# Patient Record
Sex: Male | Born: 1955
Health system: Southern US, Community
[De-identification: ages and names within clinical notes are randomized; demographics above are authoritative.]

## PROBLEM LIST (undated history)

## (undated) DIAGNOSIS — E119 Type 2 diabetes mellitus without complications: Secondary | ICD-10-CM

## (undated) DIAGNOSIS — M1990 Unspecified osteoarthritis, unspecified site: Secondary | ICD-10-CM

## (undated) DIAGNOSIS — R079 Chest pain, unspecified: Secondary | ICD-10-CM

## (undated) DIAGNOSIS — G4730 Sleep apnea, unspecified: Secondary | ICD-10-CM

## (undated) DIAGNOSIS — I10 Essential (primary) hypertension: Secondary | ICD-10-CM

## (undated) DIAGNOSIS — G40909 Epilepsy, unspecified, not intractable, without status epilepticus: Secondary | ICD-10-CM

## (undated) DIAGNOSIS — D649 Anemia, unspecified: Secondary | ICD-10-CM

## (undated) HISTORY — DX: Chest pain, unspecified: R07.9

## (undated) HISTORY — PX: WISDOM TOOTH EXTRACTION: SHX21

## (undated) HISTORY — DX: Type 2 diabetes mellitus without complications: E11.9

## (undated) HISTORY — PX: KNEE ARTHROSCOPY: SHX127

## (undated) HISTORY — DX: Morbid (severe) obesity due to excess calories: E66.01

## (undated) HISTORY — DX: Epilepsy, unspecified, not intractable, without status epilepticus: G40.909

---
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
## 2002-03-10 ENCOUNTER — Encounter: Payer: Self-pay | Admitting: Emergency Medicine

## 2002-03-10 ENCOUNTER — Emergency Department (HOSPITAL_COMMUNITY): Admission: EM | Admit: 2002-03-10 | Discharge: 2002-03-10 | Payer: Self-pay | Admitting: Emergency Medicine

## 2003-04-04 HISTORY — PX: KNEE ARTHROSCOPY: SHX127

## 2003-10-21 ENCOUNTER — Emergency Department (HOSPITAL_COMMUNITY): Admission: EM | Admit: 2003-10-21 | Discharge: 2003-10-21 | Payer: Self-pay | Admitting: Emergency Medicine

## 2013-06-07 ENCOUNTER — Encounter (HOSPITAL_COMMUNITY): Payer: Self-pay | Admitting: Emergency Medicine

## 2013-06-07 ENCOUNTER — Emergency Department (HOSPITAL_COMMUNITY)
Admission: EM | Admit: 2013-06-07 | Discharge: 2013-06-07 | Disposition: A | Discharge status: Home / Self Care | Payer: BC Managed Care – PPO | Source: Home / Self Care | Attending: Family Medicine | Admitting: Family Medicine

## 2013-06-07 DIAGNOSIS — S300XXA Contusion of lower back and pelvis, initial encounter: Secondary | ICD-10-CM

## 2013-06-07 DIAGNOSIS — S2020XA Contusion of thorax, unspecified, initial encounter: Secondary | ICD-10-CM

## 2013-06-07 MED ORDER — HYDROCODONE-ACETAMINOPHEN 5-325 MG PO TABS: 1.0000 | ORAL_TABLET | Freq: Four times a day (QID) | ORAL | Status: DC | PRN

## 2013-06-07 NOTE — ED Notes (Signed)
Patient states fell last Friday on icy concrete Landing on his tailbone Having some pain

## 2013-06-07 NOTE — ED Provider Notes (Signed)
CSN: [id number]     Arrival date & time 06/07/13  1236 History   First MD Initiated Contact with Patient 06/07/13 1438     Chief Complaint  Patient presents with  . Tailbone Pain   (Consider location/radiation/quality/duration/timing/severity/associated sxs/prior Treatment) Patient is a 58 y.o. male presenting with hip pain. The history is provided by the patient.  Hip Pain This is a new problem. The current episode started more than 1 week ago (slipped on ice landing on left buttock, still sore.). The problem has not changed since onset.Pertinent negatives include no chest pain, no abdominal pain and no headaches. The symptoms are aggravated by walking (sitting on commode.).    History reviewed. No pertinent past medical history. History reviewed. No pertinent past surgical history. No family history on file. History  Substance Use Topics  . Smoking status: Not on file  . Smokeless tobacco: Not on file  . Alcohol Use: Not on file    Review of Systems  Constitutional: Negative.   Cardiovascular: Negative for chest pain.  Gastrointestinal: Negative for abdominal pain.  Musculoskeletal: Positive for gait problem and myalgias. Negative for back pain.  Skin: Negative.   Neurological: Negative for headaches.    Allergies  Review of patient's allergies indicates no known allergies.  Home Medications   Current Outpatient Rx  Name  Route  Sig  Dispense  Refill  . HYDROcodone-acetaminophen (NORCO/VICODIN) 5-325 MG per tablet   Oral   Take 1-2 tablets by mouth every 6 (six) hours as needed.   15 tablet   0    BP 155/84  Pulse 81  Temp(Src) 98.6 F (37 C) (Oral)  Resp 18  SpO2 100% Physical Exam  Nursing note and vitals reviewed. Constitutional: He is oriented to person, place, and time. He appears well-developed and well-nourished.  Abdominal: Soft. Bowel sounds are normal.  Musculoskeletal: He exhibits tenderness.  Soreness without bruising to left lat buttock,  ambulatory.  Neurological: He is alert and oriented to person, place, and time.  Skin: Skin is warm and dry.    ED Course  Procedures (including critical care time) Labs Review Labs Reviewed - No data to display Imaging Review No results found.   MDM   1. Contusion of pelvis        Billy Fischer, MD 06/07/13 1447

## 2013-06-07 NOTE — Discharge Instructions (Signed)
Heat and pain medicine as needed, activity as tolerated, return as needed.

## 2015-11-12 LAB — BASIC METABOLIC PANEL
BUN: 12 (ref 4–21)
CO2: 24 — AB (ref 13–22)
Chloride: 100 (ref 99–108)
Creatinine: 1.1 (ref 0.6–1.3)
Glucose: 119
Potassium: 4.4 (ref 3.4–5.3)
Sodium: 139 (ref 137–147)

## 2015-11-12 LAB — HEPATIC FUNCTION PANEL
ALT: 12 (ref 10–40)
AST: 21 (ref 14–40)
Alkaline Phosphatase: 105 (ref 25–125)
Bilirubin, Total: 0.3

## 2015-11-12 LAB — CBC AND DIFFERENTIAL
HCT: 37 — AB (ref 41–53)
Hemoglobin: 11.6 — AB (ref 13.5–17.5)
Platelets: 294 (ref 150–399)
WBC: 8.1

## 2015-11-12 LAB — HEMOGLOBIN A1C: Hemoglobin A1C: 6.9

## 2015-11-12 LAB — TSH: TSH: 3.02 (ref 0.41–5.90)

## 2015-11-12 LAB — COMPREHENSIVE METABOLIC PANEL: Calcium: 9.4 (ref 8.7–10.7)

## 2015-11-12 LAB — CBC: RBC: 4.98 (ref 3.87–5.11)

## 2016-08-21 DIAGNOSIS — M1990 Unspecified osteoarthritis, unspecified site: Secondary | ICD-10-CM | POA: Insufficient documentation

## 2016-08-23 DIAGNOSIS — I10 Essential (primary) hypertension: Secondary | ICD-10-CM | POA: Insufficient documentation

## 2016-09-27 DIAGNOSIS — E785 Hyperlipidemia, unspecified: Secondary | ICD-10-CM | POA: Insufficient documentation

## 2016-09-27 DIAGNOSIS — D649 Anemia, unspecified: Secondary | ICD-10-CM | POA: Insufficient documentation

## 2016-09-27 DIAGNOSIS — R7303 Prediabetes: Secondary | ICD-10-CM | POA: Insufficient documentation

## 2017-06-07 ENCOUNTER — Ambulatory Visit: Payer: Self-pay

## 2017-06-07 ENCOUNTER — Other Ambulatory Visit: Payer: Self-pay | Admitting: Occupational Medicine

## 2017-06-07 DIAGNOSIS — M25532 Pain in left wrist: Secondary | ICD-10-CM

## 2017-06-07 DIAGNOSIS — M25531 Pain in right wrist: Secondary | ICD-10-CM

## 2017-06-07 DIAGNOSIS — G8929 Other chronic pain: Secondary | ICD-10-CM

## 2017-07-09 ENCOUNTER — Encounter: Payer: Self-pay | Admitting: Registered"

## 2017-07-09 ENCOUNTER — Encounter: Payer: BLUE CROSS/BLUE SHIELD | Attending: Family Medicine | Admitting: Registered"

## 2017-07-09 DIAGNOSIS — E119 Type 2 diabetes mellitus without complications: Secondary | ICD-10-CM | POA: Insufficient documentation

## 2017-07-09 DIAGNOSIS — Z713 Dietary counseling and surveillance: Secondary | ICD-10-CM | POA: Diagnosis not present

## 2017-07-09 NOTE — Patient Instructions (Signed)
Ways to help control blood sugar: Exercise: keep up your activity routines. Aim for at least 3 days per week of moderate activity that raises your heart reate for 30 min. Consider working up to 5 days per week. Sleep: Aim to get 7-8 hours of restful sleep. You have a goal of going to bed by 10 pm Food: Rethink your drink. Water is a great choice. You have set a goal of reducing the amount of sweetner you put in your koolaid and coffee. Pay attention to the amount carbs in your meals and try to stay within 75 carbs. Benecol is a butter spread that may help reduce cholesterol. Pistachios, peanuts.  Checking blood sugar: Call your doctor's office to ask for the prescription for the lancets and strips for the OneTouch Verio Flex. Consider checking 1 time per day, first thing before eating. Your target number 80-130 mg/dL You can check 2 hours after a meal if you want to. Your target number is less than 180 mg/dL

## 2017-07-09 NOTE — Progress Notes (Addendum)
Diabetes Self-Management Education  Visit Type: First/Initial  Appt. Start Time: 0805 Appt. End Time: 0915  07/09/2017  [name], identified by name and date of birth, is a 62 y.o. male with a diagnosis of Diabetes: Type 2.   ASSESSMENT Patient states he wants to control his blood sugar without taking medication ("refused to take medicine"). Patient states he would also like to lose weight. Patient is visually impaired but has enough vision he was able to use meter without issue and could see the BG reading when getting close to meter.  Patient states he is getting married in September and very busy with plans. Patient states due to his anemia doctor highly concerned that there is internal bleeding and patient states his doctor was supposed to get a referral to oncology. RD encouraged patient to follow-up with doctor.  Patient states he has a lot of structure to his life. Patient eats dinner at 5:30 at church on [employer], goes out on Fridays and may eat around 8 pm. Pt states he participated in darts, and goes bowling 1-2x week. Pateint states he is training for the [employer] in June. Patient also has structured routine for breakfast.  Patient states he only sleeps 5-6 hrs. Patient states he stays busy at night and gets involved in doing things he enjoys and may not go to sleep until midnight and then wakes up for work at 4:45 pm. Patient states he has energy when getting ready for work, but he gets tired when sits in chair at work.  Patient states he drinks ~8 oz OJ 3x week, quit drinking alcohol 2016.  Meter provided: [employer] Flex Lot #: [id number] X Exp: 06/01/18 BG: 187  Diabetes Self-Management Education - 07/09/17 0809      Visit Information   Visit Type  First/Initial      Initial Visit   Diabetes Type  Type 2    Are you currently following a meal plan?  No    Are you taking your medications as prescribed?  Not on Medications    Date  Diagnosed  March 2019      Health Coping   How would you rate your overall health?  Fair      Psychosocial Assessment   Patient Belief/Attitude about Diabetes  Motivated to manage diabetes    How often do you need to have someone help you when you read instructions, pamphlets, or other written materials from your doctor or pharmacy?  1 - Never    What is the last grade level you completed in school?  high school      Complications   Last HgB A1C per patient/outside source  7 % per referral    How often do you check your blood sugar?  0 times/day (not testing)    Have you had a dilated eye exam in the past 12 months?  No    Have you had a dental exam in the past 12 months?  No    Are you checking your feet?  Yes    How many days per week are you checking your feet?  5      Dietary Intake   Breakfast  3-4x week frosted flakes cereal, banana, coffee OR Sundays egg beaters, [place] sausage, pancakes syrup    Snack (morning)  none    Lunch  whipped yogurt OR sandwich OR tuna on crackers OR spaghetti OR grilled chicken salad  12-12:30 lunch break    Snack (  afternoon)  lorna doone cookies 2:30 break time    Dinner  chilis 1/2 slab ribs, sausage jalapeno, chili (2 meals), strawberry tea (no refill) OR baked chicken, cream corn 7 pm on gym nights    Snack (evening)  weekends ice cream & cake OR cookies OR none 10 pm    Beverage(s)  coffee flavored creamer, 1 T sugar, koolaid,       Exercise   Exercise Type  Light (walking / raking leaves)    How many days per week to you exercise?  2    How many minutes per day do you exercise?  40    Total minutes per week of exercise  80      Patient Education   Previous Diabetes Education  No    Nutrition management   Role of diet in the treatment of diabetes and the relationship between the three main macronutrients and blood glucose level    Physical activity and exercise   Role of exercise on diabetes management, blood pressure control and cardiac  health.    Monitoring  Taught/evaluated SMBG meter.    Acute complications  Taught treatment of hypoglycemia - the 15 rule.    Psychosocial adjustment  Role of stress on diabetes      Individualized Goals (developed by patient)   Physical Activity  Exercise 3-5 times per week    Monitoring   test my blood glucose as discussed      Outcomes   Expected Outcomes  Demonstrated interest in learning. Expect positive outcomes    Future DMSE  PRN    Program Status  Completed      Individualized Plan for Diabetes Self-Management Training:   Learning Objective:  Patient will have a greater understanding of diabetes self-management. Patient education plan is to attend individual and/or group sessions per assessed needs and concerns.   Patient Instructions  Ways to help control blood sugar: Exercise: keep up your activity routines. Aim for at least 3 days per week of moderate activity that raises your heart reate for 30 min. Consider working up to 5 days per week. Sleep: Aim to get 7-8 hours of restful sleep. You have a goal of going to bed by 10 pm Food: Rethink your drink. Water is a great choice. You have set a goal of reducing the amount of sweetner you put in your koolaid and coffee. Pay attention to the amount carbs in your meals and try to stay within 75 carbs. Benecol is a butter spread that may help reduce cholesterol. Pistachios, peanuts.  Checking blood sugar: Call your doctor's office to ask for the prescription for the lancets and strips for the OneTouch Verio Flex. Consider checking 1 time per day, first thing before eating. Your target number 80-130 mg/dL You can check 2 hours after a meal if you want to. Your target number is less than 180 mg/dL   Expected Outcomes:  Demonstrated interest in learning. Expect positive outcomes  Education material provided: A1C conversion sheet  If problems or questions, patient to contact team via:  Phone  Future DSME appointment: PRN

## 2017-08-17 DIAGNOSIS — R600 Localized edema: Secondary | ICD-10-CM | POA: Diagnosis not present

## 2017-08-29 DIAGNOSIS — D649 Anemia, unspecified: Secondary | ICD-10-CM | POA: Diagnosis not present

## 2017-08-29 DIAGNOSIS — E785 Hyperlipidemia, unspecified: Secondary | ICD-10-CM | POA: Diagnosis not present

## 2017-08-29 DIAGNOSIS — E119 Type 2 diabetes mellitus without complications: Secondary | ICD-10-CM | POA: Diagnosis not present

## 2017-08-29 DIAGNOSIS — Z125 Encounter for screening for malignant neoplasm of prostate: Secondary | ICD-10-CM | POA: Diagnosis not present

## 2017-08-29 DIAGNOSIS — E0843 Diabetes mellitus due to underlying condition with diabetic autonomic (poly)neuropathy: Secondary | ICD-10-CM | POA: Insufficient documentation

## 2017-08-29 DIAGNOSIS — I10 Essential (primary) hypertension: Secondary | ICD-10-CM | POA: Diagnosis not present

## 2017-08-29 DIAGNOSIS — Z6841 Body Mass Index (BMI) 40.0 and over, adult: Secondary | ICD-10-CM | POA: Diagnosis not present

## 2017-12-19 DIAGNOSIS — I10 Essential (primary) hypertension: Secondary | ICD-10-CM | POA: Diagnosis not present

## 2017-12-19 DIAGNOSIS — D649 Anemia, unspecified: Secondary | ICD-10-CM | POA: Diagnosis not present

## 2017-12-19 DIAGNOSIS — Z6841 Body Mass Index (BMI) 40.0 and over, adult: Secondary | ICD-10-CM | POA: Diagnosis not present

## 2017-12-19 DIAGNOSIS — E785 Hyperlipidemia, unspecified: Secondary | ICD-10-CM | POA: Diagnosis not present

## 2017-12-19 DIAGNOSIS — E119 Type 2 diabetes mellitus without complications: Secondary | ICD-10-CM | POA: Diagnosis not present

## 2018-01-12 ENCOUNTER — Emergency Department (HOSPITAL_COMMUNITY)
Admission: EM | Admit: 2018-01-12 | Discharge: 2018-01-12 | Disposition: A | Discharge status: Home / Self Care | Payer: BLUE CROSS/BLUE SHIELD | Attending: Emergency Medicine | Admitting: Emergency Medicine

## 2018-01-12 ENCOUNTER — Emergency Department (HOSPITAL_COMMUNITY): Payer: BLUE CROSS/BLUE SHIELD

## 2018-01-12 ENCOUNTER — Encounter (HOSPITAL_COMMUNITY): Payer: Self-pay | Admitting: Emergency Medicine

## 2018-01-12 DIAGNOSIS — L03032 Cellulitis of left toe: Secondary | ICD-10-CM | POA: Diagnosis not present

## 2018-01-12 DIAGNOSIS — Z79899 Other long term (current) drug therapy: Secondary | ICD-10-CM | POA: Diagnosis not present

## 2018-01-12 DIAGNOSIS — I10 Essential (primary) hypertension: Secondary | ICD-10-CM | POA: Insufficient documentation

## 2018-01-12 DIAGNOSIS — M79672 Pain in left foot: Secondary | ICD-10-CM | POA: Diagnosis present

## 2018-01-12 DIAGNOSIS — L089 Local infection of the skin and subcutaneous tissue, unspecified: Secondary | ICD-10-CM | POA: Diagnosis not present

## 2018-01-12 DIAGNOSIS — E119 Type 2 diabetes mellitus without complications: Secondary | ICD-10-CM | POA: Diagnosis not present

## 2018-01-12 HISTORY — DX: Type 2 diabetes mellitus without complications: E11.9

## 2018-01-12 HISTORY — DX: Essential (primary) hypertension: I10

## 2018-01-12 LAB — CBC
HCT: 39.7 % (ref 39.0–52.0)
Hemoglobin: 11.9 g/dL — ABNORMAL LOW (ref 13.0–17.0)
MCH: 23.5 pg — ABNORMAL LOW (ref 26.0–34.0)
MCHC: 30 g/dL (ref 30.0–36.0)
MCV: 78.5 fL — ABNORMAL LOW (ref 80.0–100.0)
Platelets: 256 10*3/uL (ref 150–400)
RBC: 5.06 MIL/uL (ref 4.22–5.81)
RDW: 14.6 % (ref 11.5–15.5)
WBC: 10 10*3/uL (ref 4.0–10.5)
nRBC: 0 % (ref 0.0–0.2)

## 2018-01-12 LAB — BASIC METABOLIC PANEL
Anion gap: 8 (ref 5–15)
BUN: 12 mg/dL (ref 8–23)
CO2: 26 mmol/L (ref 22–32)
Calcium: 9.3 mg/dL (ref 8.9–10.3)
Chloride: 103 mmol/L (ref 98–111)
Creatinine, Ser: 1.01 mg/dL (ref 0.61–1.24)
GFR calc Af Amer: >60 mL/min (ref >60)
GFR calc non Af Amer: >60 mL/min (ref >60)
Glucose, Bld: 105 mg/dL — ABNORMAL HIGH (ref 70–99)
Potassium: 3.7 mmol/L (ref 3.5–5.1)
Sodium: 137 mmol/L (ref 135–145)

## 2018-01-12 MED ORDER — DOXYCYCLINE HYCLATE 100 MG PO CAPS: 100.0000 mg | ORAL_CAPSULE | Freq: Two times a day (BID) | ORAL | 0 refills | Status: DC

## 2018-01-12 MED ORDER — DOXYCYCLINE HYCLATE 100 MG PO TABS
100.0000 mg | ORAL_TABLET | Freq: Once | ORAL | Status: AC
  Administered 2018-01-12: 100 mg via ORAL
  Filled 2018-01-12: qty 1

## 2018-01-12 NOTE — Discharge Instructions (Addendum)
Apply antibacterial ointment twice a day to the toe  Soak your foot in warm water with an antibacterial soap twice a day for 20 minutes.  Pat dry the location, apply an antibacterial ointment  Please call the podiatrist for follow up

## 2018-01-12 NOTE — ED Provider Notes (Signed)
[facility] EMERGENCY DEPARTMENT Provider Note   CSN: [id number] Arrival date & time: 01/12/18  1637     History   Chief Complaint Chief Complaint  Patient presents with  . Foot Pain    HPI Michael [name] is a 62 y.o. male.  HPI Patient is a 62 year old male presents to the emergency department with complaints of increasing pain between his first and second toes on his left foot.  He also reports mild pain to the left second toe with some developing redness.  No known injury.  He does have a history of diabetes.  Denies fevers or chills.  No known injury or trauma.  Patient is legally blind and therefore it is difficult for him to keep an eye on his feet.  No fevers or chills.  No spreading redness besides the area isolated to the left second toe.   Past Medical History:  Diagnosis Date  . Diabetes mellitus without complication ([place])   . Hypertension     There are no active problems to display for this patient.   Past Surgical History:  Procedure Laterality Date  . KNEE ARTHROSCOPY          Home Medications    Prior to Admission medications   Medication Sig Start Date End Date Taking? Authorizing Provider  amLODipine (NORVASC) 10 MG tablet Take 10 mg by mouth daily.    [provider]  atorvastatin (LIPITOR) 20 MG tablet Take 20 mg by mouth daily.    [provider]  doxycycline (VIBRAMYCIN) 100 MG capsule Take 1 capsule (100 mg total) by mouth 2 (two) times daily. 01/12/18   Jola Schmidt, MD  HYDROcodone-acetaminophen (NORCO/VICODIN) 5-325 MG per tablet Take 1-2 tablets by mouth every 6 (six) hours as needed. 06/07/13   Billy Fischer, MD    Family History History reviewed. No pertinent family history.  Social History Social History   Tobacco Use  . Smoking status: Never Smoker  . Smokeless tobacco: Never Used  Substance Use Topics  . Alcohol use: Not Currently  . Drug use: Never     Allergies   Patient has no  known allergies.   Review of Systems Review of Systems  All other systems reviewed and are negative.    Physical Exam Updated Vital Signs BP (!) 145/84   Pulse 72   Temp 98.4 F (36.9 C) (Oral)   Resp 16   SpO2 97%   Physical Exam  Constitutional: He is oriented to person, place, and time. He appears well-developed and well-nourished.  HENT:  Head: Normocephalic.  Eyes: EOM are normal.  Neck: Normal range of motion.  Pulmonary/Chest: Effort normal.  Abdominal: He exhibits no distension.  Musculoskeletal: Normal range of motion.  Normal PT and DP pulse the left foot.  Compartments left foot are soft.  Patient does have skin breakdown between the first and second toes on the left foot with developing erythema of the left second toe spreading to the very distal aspect of the dorsum of the left foot.  This was outlined with a pen  Neurological: He is alert and oriented to person, place, and time.  Psychiatric: He has a normal mood and affect.  Nursing note and vitals reviewed.    ED Treatments / Results  Labs (all labs ordered are listed, but only abnormal results are displayed) Labs Reviewed  CBC - Abnormal; Notable for the following components:      Result Value   Hemoglobin 11.9 (*)  MCV 78.5 (*)    MCH 23.5 (*)    All other components within normal limits  BASIC METABOLIC PANEL - Abnormal; Notable for the following components:   Glucose, Bld 105 (*)    All other components within normal limits    EKG None  Radiology Dg Foot Complete Left  Result Date: 01/12/2018 CLINICAL DATA:  Pain and soft tissue infection between 1st and 2nd toes. EXAM: LEFT FOOT - COMPLETE 3+ VIEW COMPARISON:  None. FINDINGS: There is no evidence of fracture or dislocation. No evidence of osteolysis or periostitis. There is no evidence of arthropathy. Small plantar and dorsal calcaneal bone spurs noted. Soft tissues are unremarkable. IMPRESSION: No acute findings. Electronically Signed    By: Earle Gell M.D.   On: 01/12/2018 20:09    Procedures Procedures (including critical care time)  Medications Ordered in ED Medications  doxycycline (VIBRA-TABS) tablet 100 mg (100 mg Oral Given 01/12/18 1853)     Initial Impression / Assessment and Plan / ED Course  I have reviewed the triage vital signs and the nursing notes.  Pertinent labs & imaging results that were available during my care of the patient were reviewed by me and considered in my medical decision making (see chart for details).     Patient will be initiated on antibiotics.  Recommended warm Dial soap soaks twice a day.  Recommended topical antibiotic twice a day.  We will start the patient on doxycycline.  I have asked the patient to follow-up with the podiatrist this week.  He understands return to the ER for new or worsening symptoms.  Skin breakdown between the first and second toe with developing cellulitis.  X-ray without signs of osteo-.  X-ray without subcutaneous air  All questions answered.  Final Clinical Impressions(s) / ED Diagnoses   Final diagnoses:  Cellulitis of left toe    ED Discharge Orders         Ordered    doxycycline (VIBRAMYCIN) 100 MG capsule  2 times daily     01/12/18 2038           Jola Schmidt, MD 01/12/18 2042

## 2018-01-12 NOTE — ED Triage Notes (Signed)
Pt presents to ED for assessment of left great and second toe pain on the left foot.  Redness, swelling, and dried blood noted.  Pt denies known injury.  Hx of DM 2, states he does not take anything for it.  Patient states sugars recently 120s.

## 2018-01-23 ENCOUNTER — Ambulatory Visit: Payer: BLUE CROSS/BLUE SHIELD | Admitting: Podiatry

## 2018-01-23 DIAGNOSIS — B351 Tinea unguium: Secondary | ICD-10-CM

## 2018-01-23 DIAGNOSIS — I70245 Atherosclerosis of native arteries of left leg with ulceration of other part of foot: Secondary | ICD-10-CM

## 2018-01-23 DIAGNOSIS — L97522 Non-pressure chronic ulcer of other part of left foot with fat layer exposed: Secondary | ICD-10-CM | POA: Diagnosis not present

## 2018-01-23 DIAGNOSIS — M79676 Pain in unspecified toe(s): Secondary | ICD-10-CM

## 2018-01-23 DIAGNOSIS — E0843 Diabetes mellitus due to underlying condition with diabetic autonomic (poly)neuropathy: Secondary | ICD-10-CM

## 2018-01-23 MED ORDER — DOXYCYCLINE HYCLATE 100 MG PO TABS: 100.0000 mg | ORAL_TABLET | Freq: Two times a day (BID) | ORAL | 0 refills | Status: DC

## 2018-01-27 NOTE — Progress Notes (Signed)
   Subjective:  62 year old male presenting today as a new patient with a chief complaint of a wound to the left second toe that he noticed 2-3 weeks ago. He reports associated swelling and redness of the wound. He states he has taken a course of Doxycyline for treatment. He denies any known trauma or injury of the toe. Patient is here for further evaluation and treatment.    Objective/Physical Exam General: The patient is alert and oriented x3 in no acute distress.  Dermatology:  Wound #1 noted to the left 2nd toe measuring 0.3 x 0.3 x 0.1 cm (LxWxD).   To the noted ulceration(s), there is no eschar. There is a moderate amount of slough, fibrin, and necrotic tissue noted. Granulation tissue and wound base is red. There is a minimal amount of serosanguineous drainage noted. There is no exposed bone muscle-tendon ligament or joint. There is no malodor. Periwound integrity is intact. Skin is warm, dry and supple bilateral lower extremities.  Vascular: Palpable pedal pulses bilaterally. No edema or erythema noted. Capillary refill within normal limits.  Neurological: Epicritic and protective threshold diminished bilaterally.   Musculoskeletal Exam: Range of motion within normal limits to all pedal and ankle joints bilateral. Muscle strength 5/5 in all groups bilateral.   Assessment: #1 ulceration of the left 2nd toe secondary to diabetes mellitus #2 diabetes mellitus w/ peripheral neuropathy #3 Onychomycosis of nail due to dermatophyte bilateral   Plan of Care:  #1 Patient was evaluated. #2 medically necessary excisional debridement including subcutaneous tissue was performed using a tissue nipper and a chisel blade. Excisional debridement of all the necrotic nonviable tissue down to healthy bleeding viable tissue was performed with post-debridement measurements same as pre-. #3 the wound was cleansed and dry sterile dressing applied. #4 Mechanical debridement of nails 1-5 bilaterally  performed using a nail nipper. Filed with dremel without incident.  #5 Recommended using Betadine twice daily between the toes.  #6 Refill prescription for Doxycycline 100 mg #20 provided to patient.  #7 patient is to return to clinic in 2 weeks.   Edrick Kins, DPM [facility]  Dr. Edrick Kins, [address]                                        [place], [place] [address]                Office [phone]  Fax [phone]

## 2018-02-06 ENCOUNTER — Encounter: Payer: Self-pay | Admitting: Podiatry

## 2018-02-06 ENCOUNTER — Ambulatory Visit: Payer: BLUE CROSS/BLUE SHIELD | Admitting: Podiatry

## 2018-02-06 DIAGNOSIS — E0843 Diabetes mellitus due to underlying condition with diabetic autonomic (poly)neuropathy: Secondary | ICD-10-CM

## 2018-02-06 DIAGNOSIS — I70245 Atherosclerosis of native arteries of left leg with ulceration of other part of foot: Secondary | ICD-10-CM

## 2018-02-06 DIAGNOSIS — L97522 Non-pressure chronic ulcer of other part of left foot with fat layer exposed: Secondary | ICD-10-CM

## 2018-02-09 LAB — WOUND CULTURE
MICRO NUMBER:: 91336863
SPECIMEN QUALITY:: ADEQUATE

## 2018-02-10 NOTE — Progress Notes (Signed)
   Subjective:  62 year old male with PMHx of DM presenting today for follow up evaluation of an ulceration of the left second toe. He states he does not believe the wound has improved since his last visit. He reports some mild soreness of the area. He has been using Betadine daily. There are no modifying factors noted. Patient is here for further evaluation and treatment.   Past Medical History:  Diagnosis Date  . Diabetes mellitus without complication ([place])   . Hypertension       Objective/Physical Exam General: The patient is alert and oriented x3 in no acute distress.  Dermatology:  Wound #1 noted to the left 2nd toe measuring 0.7 x 0.7 x 0.1 cm (LxWxD).   To the noted ulceration(s), there is no eschar. There is a moderate amount of slough, fibrin, and necrotic tissue noted. Granulation tissue and wound base is red. There is a minimal amount of serosanguineous drainage noted. There is no exposed bone muscle-tendon ligament or joint. There is no malodor. Periwound integrity is intact. Skin is warm, dry and supple bilateral lower extremities.  Vascular: Palpable pedal pulses bilaterally. No edema or erythema noted. Capillary refill within normal limits.  Neurological: Epicritic and protective threshold diminished bilaterally.   Musculoskeletal Exam: Range of motion within normal limits to all pedal and ankle joints bilateral. Muscle strength 5/5 in all groups bilateral.   Assessment: #1 ulceration of the left 2nd toe secondary to diabetes mellitus #2 diabetes mellitus w/ peripheral neuropathy   Plan of Care:  #1 Patient was evaluated. #2 medically necessary excisional debridement including subcutaneous tissue was performed using a tissue nipper and a chisel blade. Excisional debridement of all the necrotic nonviable tissue down to healthy bleeding viable tissue was performed with post-debridement measurements same as pre-. #3 the wound was cleansed and dry sterile dressing  applied. #4 Mechanical debridement of nails 1-5 bilaterally performed using a nail nipper. Filed with dremel without incident.  #5 Discontinue using Betadine.  #6 Begin using Aquacel and a bandage daily.  #7 Cultures taken from wound.  #8 Return to clinic in 2 weeks.    Edrick Kins, DPM [facility]  Dr. Edrick Kins, [address]                                        [place], [place] [address]                Office [phone]  Fax [phone]

## 2018-02-20 ENCOUNTER — Ambulatory Visit: Payer: BLUE CROSS/BLUE SHIELD | Admitting: Podiatry

## 2018-02-20 ENCOUNTER — Encounter: Payer: Self-pay | Admitting: Podiatry

## 2018-02-20 DIAGNOSIS — E0843 Diabetes mellitus due to underlying condition with diabetic autonomic (poly)neuropathy: Secondary | ICD-10-CM | POA: Diagnosis not present

## 2018-02-20 DIAGNOSIS — I70245 Atherosclerosis of native arteries of left leg with ulceration of other part of foot: Secondary | ICD-10-CM | POA: Diagnosis not present

## 2018-02-20 DIAGNOSIS — L97522 Non-pressure chronic ulcer of other part of left foot with fat layer exposed: Secondary | ICD-10-CM

## 2018-02-25 NOTE — Progress Notes (Signed)
   Subjective:  62 year old male with PMHx of DM presenting today for follow up evaluation of an ulceration of the left second toe. He states the wound has improved since his previous visit. He reports some mild erythema. There are no modifying factors noted. He has been using Aquacel Ag as directed. Patient is here for further evaluation and treatment.   Past Medical History:  Diagnosis Date  . Diabetes mellitus without complication ([place])   . Hypertension       Objective/Physical Exam General: The patient is alert and oriented x3 in no acute distress.  Dermatology:  Wound #1 noted to the left 2nd toe measuring 0.3 x 0.3 x 0.1 cm (LxWxD).   To the noted ulceration(s), there is no eschar. There is a moderate amount of slough, fibrin, and necrotic tissue noted. Granulation tissue and wound base is red. There is a minimal amount of serosanguineous drainage noted. There is no exposed bone muscle-tendon ligament or joint. There is no malodor. Periwound integrity is intact. Skin is warm, dry and supple bilateral lower extremities.  Vascular: Palpable pedal pulses bilaterally. No edema or erythema noted. Capillary refill within normal limits.  Neurological: Epicritic and protective threshold diminished bilaterally.   Musculoskeletal Exam: Range of motion within normal limits to all pedal and ankle joints bilateral. Muscle strength 5/5 in all groups bilateral.   Assessment: #1 ulceration of the left 2nd toe secondary to diabetes mellitus #2 diabetes mellitus w/ peripheral neuropathy   Plan of Care:  #1 Patient was evaluated. #2 medically necessary excisional debridement including subcutaneous tissue was performed using a tissue nipper and a chisel blade. Excisional debridement of all the necrotic nonviable tissue down to healthy bleeding viable tissue was performed with post-debridement measurements same as pre-. #3 the wound was cleansed and dry sterile dressing applied. #4 Continue using  Aquacel Ag daily with a bandage.  #5 Return to clinic in 3 weeks. We will discuss onychomycosis treatments at the next visit.    Edrick Kins, DPM [facility]  Dr. Edrick Kins, [address]                                        [place], [place] [address]                Office [phone]  Fax [phone]

## 2018-03-18 ENCOUNTER — Ambulatory Visit: Payer: BLUE CROSS/BLUE SHIELD | Admitting: Podiatry

## 2018-03-18 ENCOUNTER — Encounter: Payer: Self-pay | Admitting: Podiatry

## 2018-03-18 DIAGNOSIS — B351 Tinea unguium: Secondary | ICD-10-CM | POA: Diagnosis not present

## 2018-03-18 DIAGNOSIS — I70245 Atherosclerosis of native arteries of left leg with ulceration of other part of foot: Secondary | ICD-10-CM | POA: Diagnosis not present

## 2018-03-18 DIAGNOSIS — E0843 Diabetes mellitus due to underlying condition with diabetic autonomic (poly)neuropathy: Secondary | ICD-10-CM

## 2018-03-18 DIAGNOSIS — L97522 Non-pressure chronic ulcer of other part of left foot with fat layer exposed: Secondary | ICD-10-CM

## 2018-03-18 DIAGNOSIS — M79676 Pain in unspecified toe(s): Secondary | ICD-10-CM

## 2018-03-20 DIAGNOSIS — I10 Essential (primary) hypertension: Secondary | ICD-10-CM | POA: Diagnosis not present

## 2018-03-20 DIAGNOSIS — E785 Hyperlipidemia, unspecified: Secondary | ICD-10-CM | POA: Diagnosis not present

## 2018-03-20 DIAGNOSIS — D649 Anemia, unspecified: Secondary | ICD-10-CM | POA: Diagnosis not present

## 2018-03-20 DIAGNOSIS — E119 Type 2 diabetes mellitus without complications: Secondary | ICD-10-CM | POA: Diagnosis not present

## 2018-03-21 NOTE — Progress Notes (Signed)
   Subjective: 62 year old male presenting today for follow up evaluation of a diabetic ulceration of the left second toe. He states the wound has healed and he has no concerns regarding it. He denies any pain or modifying factors. Patient is here for further evaluation and treatment.   Past Medical History:  Diagnosis Date  . Diabetes mellitus without complication ([place])   . Hypertension     Objective: Physical Exam General: The patient is alert and oriented x3 in no acute distress.  Dermatology: Wound noted to the left second toe has healed. Complete re-epithelialization has occurred. No drainage noted. Hyperkeratotic, discolored, thickened, onychodystrophy of nails noted bilaterally. Skin is warm, dry and supple bilateral lower extremities. Negative for open lesions or macerations.  Vascular: Palpable pedal pulses bilaterally. No edema or erythema noted. Capillary refill within normal limits.  Neurological: Epicritic and protective threshold grossly intact bilaterally.   Musculoskeletal Exam: Range of motion within normal limits to all pedal and ankle joints bilateral. Muscle strength 5/5 in all groups bilateral.   Assessment: #1 ulceration of the left 2nd toe secondary to DM - healed #2 diabetes mellitus with peripheral neuropathy #3 Onychomycosis nails 1-5 bilateral  Plan of Care:  #1 Patient was evaluated. #2 Discussed different treatment option for onychomycosis. Patient will discuss with his wife. He is contemplating laser treatment.  #3 Return to clinic as needed.    Edrick Kins, DPM [facility]  Dr. Edrick Kins, [address]                                        [place], [place] [address]                Office [phone]  Fax [phone]

## 2018-05-01 ENCOUNTER — Telehealth: Payer: Self-pay | Admitting: Hematology

## 2018-05-01 ENCOUNTER — Encounter: Payer: Self-pay | Admitting: Hematology

## 2018-05-01 NOTE — Telephone Encounter (Signed)
A new hem appt has been scheduled for the pt to see Dr. Irene Limbo on 2/18 at 1pm. Pt aware to arrive 30 minutes early. Letter mailed.

## 2018-05-20 NOTE — Progress Notes (Signed)
HEMATOLOGY/ONCOLOGY CONSULTATION NOTE  Date of Service: 05/21/2018  Patient Care Team: [name], NP as PCP - General (Family Medicine)  CHIEF COMPLAINTS/PURPOSE OF CONSULTATION:  Anemia  HISTORY OF PRESENTING ILLNESS:   Michael [name] is a wonderful 63 y.o. male who has been referred to [place] by [name], FNP for evaluation and management of Anemia. The pt reports that he is doing well overall.  The pt reports that he has not felt differently recently as compared to the previous 6 months or a year. He denies any new fatigue, fevers, chills, or night sweats. The pt notes that he has never been aware of having anemia until December. He notes that he is eating less intentionally. He had a colonoscopy 10 years ago, is having his next colonoscopy in March 2020. He denies any bleeding issues or ulcer issues. He has not taken blood thinners before. The pt notes that he has arthritis, localized to his knees and shoulders. He notes that his joints are always stiff, and improves after walking around, denies concerns for RA.   The pt notes that he has had a regular chest pain since 1985 which "comes on by itself." He notes that he has been fully evaluated by cardiology and notes that there have not been any findings to note.  The pt notes that he is not taking any medications for his DM, and notes that this has bene well controlled. He notes that his HTN is well controlled. He denies kidney problems. Endorses myopic degeneration since he was 63 years old, and notes he is legally blind.   He has had two knee surgeries, his last in 2005.  Most recent lab results (03/20/18) of CBC w/diff is as follows: all values are WNL except for HGB at 11.6, HCT at 37.6, MCV at 76.0, MCH at 23.4, MCHC at 30.9, MPV at 13.2.  On review of systems, pt reports stable weight, eating well, good energy levels, and denies concerns for bleeding, swollen joints, leg swelling, abdominal pains, and any other symptoms.    On PMHx the pt reports myopic degeneration, DM type II, HTN.   MEDICAL HISTORY:  Past Medical History:  Diagnosis Date  . Diabetes mellitus without complication ([place])   . Hypertension     SURGICAL HISTORY: Past Surgical History:  Procedure Laterality Date  . KNEE ARTHROSCOPY      SOCIAL HISTORY: Social History   Socioeconomic History  . Marital status: Married    Spouse name: Not on file  . Number of children: Not on file  . Years of education: Not on file  . Highest education level: Not on file  Occupational History  . Not on file  Social Needs  . Financial resource strain: Not on file  . Food insecurity:    Worry: Not on file    Inability: Not on file  . Transportation needs:    Medical: Not on file    Non-medical: Not on file  Tobacco Use  . Smoking status: Never Smoker  . Smokeless tobacco: Never Used  Substance and Sexual Activity  . Alcohol use: Not Currently  . Drug use: Never  . Sexual activity: Not on file  Lifestyle  . Physical activity:    Days per week: Not on file    Minutes per session: Not on file  . Stress: Not on file  Relationships  . Social connections:    Talks on phone: Not on file    Gets together: Not on file  Attends religious service: Not on file    Active member of club or organization: Not on file    Attends meetings of clubs or organizations: Not on file    Relationship status: Not on file  . Intimate partner violence:    Fear of current or ex partner: Not on file    Emotionally abused: Not on file    Physically abused: Not on file    Forced sexual activity: Not on file  Other Topics Concern  . Not on file  Social History Narrative  . Not on file    FAMILY HISTORY: No family history on file.  ALLERGIES:  has No Known Allergies.  MEDICATIONS:  Current Outpatient Medications  Medication Sig Dispense Refill  . amLODipine (NORVASC) 10 MG tablet Take 10 mg by mouth daily.    Marland Kitchen atorvastatin (LIPITOR) 20 MG tablet  Take 20 mg by mouth daily.    Marland Kitchen doxycycline (VIBRA-TABS) 100 MG tablet Take 1 tablet (100 mg total) by mouth 2 (two) times daily. 20 tablet 0  . hydrochlorothiazide (HYDRODIURIL) 12.5 MG tablet hydrochlorothiazide 12.5 mg tablet    . HYDROcodone-acetaminophen (NORCO/VICODIN) 5-325 MG per tablet Take 1-2 tablets by mouth every 6 (six) hours as needed. 15 tablet 0   No current facility-administered medications for this visit.     REVIEW OF SYSTEMS:    10 Point review of Systems was done is negative except as noted above.  PHYSICAL EXAMINATION:  . Vitals:   05/21/18 1244  BP: (!) 159/85  Pulse: 67  Resp: 18  Temp: 98.3 F (36.8 C)  SpO2: 100%   Filed Weights   05/21/18 1244  Weight: (!) 383 lb 9.6 oz (174 kg)   .Body mass index is 47.63 kg/m.  GENERAL:alert, in no acute distress and comfortable SKIN: no acute rashes, no significant lesions EYES: conjunctiva are pink and non-injected, sclera anicteric OROPHARYNX: MMM, no exudates, no oropharyngeal erythema or ulceration NECK: supple, no JVD LYMPH:  no palpable lymphadenopathy in the cervical, axillary or inguinal regions LUNGS: clear to auscultation b/l with normal respiratory effort HEART: regular rate & rhythm ABDOMEN:  normoactive bowel sounds , non tender, not distended. Extremity: no pedal edema PSYCH: alert & oriented x 3 with fluent speech NEURO: no focal motor/sensory deficits  LABORATORY DATA:  I have reviewed the data as listed  . CBC Latest Ref Rng & Units 01/12/2018  WBC 4.0 - 10.5 K/uL 10.0  Hemoglobin 13.0 - 17.0 g/dL 11.9(L)  Hematocrit 39.0 - 52.0 % 39.7  Platelets 150 - 400 K/uL 256    . CMP Latest Ref Rng & Units 01/12/2018  Glucose 70 - 99 mg/dL 105(H)  BUN 8 - 23 mg/dL 12  Creatinine 0.61 - 1.24 mg/dL 1.01  Sodium 135 - 145 mmol/L 137  Potassium 3.5 - 5.1 mmol/L 3.7  Chloride 98 - 111 mmol/L 103  CO2 22 - 32 mmol/L 26  Calcium 8.9 - 10.3 mg/dL 9.3   03/20/18 CBC  w/diff:    RADIOGRAPHIC STUDIES: I have personally reviewed the radiological images as listed and agreed with the findings in the report. No results found.  ASSESSMENT & PLAN:  63 y.o. male with  1. Microcytic Anemia  -Discussed patient's most recent labs from 03/20/18, WBC normal at 8.5k, RBC normal at 4.98, HGB at 11.6 with an MCV at 76.0, PLT normal at 241k -11/12/15 HGB at 11.6 with MCV at 74.2 -Iron deficiency vs chronic inflammation vs mild alpha thalassemia trait -Will order blood tests today  for further elucidation as noted below -Follow up with colonoscopy in March with GI -Will see the pt back as needed based on labs today  . Orders Placed This Encounter  Procedures  . CBC with Differential/Platelet    Standing Status:   Future    Number of Occurrences:   1    Standing Expiration Date:   06/25/2019  . CMP ([place] only)    Standing Status:   Future    Number of Occurrences:   1    Standing Expiration Date:   05/22/2019  . Ferritin    Standing Status:   Future    Number of Occurrences:   1    Standing Expiration Date:   05/22/2019  . Iron and TIBC    Standing Status:   Future    Number of Occurrences:   1    Standing Expiration Date:   05/21/2019  . Alpha-Thalassemia GenotypR    Standing Status:   Future    Number of Occurrences:   1    Standing Expiration Date:   05/22/2019  . Hemoglobinopathy evaluation    Standing Status:   Future    Number of Occurrences:   1    Standing Expiration Date:   05/21/2019    Labs today RTC with Dr Irene Limbo based on labs   All of the patients questions were answered with apparent satisfaction. The patient knows to call the clinic with any problems, questions or concerns.  The total time spent in the appt was 30 minutes and more than 50% was on counseling and direct patient cares.    Sullivan Lone MD MS AAHIVMS [facility] Hematology/Oncology Physician [facility]  (Office):       [phone] (Work cell):   [phone] (Fax):           [phone]  05/21/2018 1:30 PM  I, Baldwin [place], am acting as a scribe for Dr. Sullivan Lone.   .I have reviewed the above documentation for accuracy and completeness, and I agree with the above. Brunetta Genera MD

## 2018-05-21 ENCOUNTER — Telehealth: Payer: Self-pay | Admitting: Hematology

## 2018-05-21 ENCOUNTER — Inpatient Hospital Stay: Payer: BLUE CROSS/BLUE SHIELD | Attending: Hematology | Admitting: Hematology

## 2018-05-21 ENCOUNTER — Inpatient Hospital Stay: Payer: BLUE CROSS/BLUE SHIELD

## 2018-05-21 VITALS — BP 159/85 | HR 67 | Temp 98.3°F | Resp 18 | Ht 75.25 in | Wt 383.6 lb

## 2018-05-21 DIAGNOSIS — R718 Other abnormality of red blood cells: Secondary | ICD-10-CM

## 2018-05-21 DIAGNOSIS — D649 Anemia, unspecified: Secondary | ICD-10-CM

## 2018-05-21 DIAGNOSIS — E119 Type 2 diabetes mellitus without complications: Secondary | ICD-10-CM | POA: Diagnosis not present

## 2018-05-21 DIAGNOSIS — D509 Iron deficiency anemia, unspecified: Secondary | ICD-10-CM | POA: Insufficient documentation

## 2018-05-21 DIAGNOSIS — I10 Essential (primary) hypertension: Secondary | ICD-10-CM

## 2018-05-21 LAB — FERRITIN: Ferritin: 132 ng/mL (ref 24–336)

## 2018-05-21 LAB — CMP (CANCER CENTER ONLY)
ALT: 12 U/L (ref 0–44)
AST: 22 U/L (ref 15–41)
Albumin: 4 g/dL (ref 3.5–5.0)
Alkaline Phosphatase: 112 U/L (ref 38–126)
Anion gap: 8 (ref 5–15)
BUN: 12 mg/dL (ref 8–23)
CO2: 29 mmol/L (ref 22–32)
Calcium: 9.3 mg/dL (ref 8.9–10.3)
Chloride: 103 mmol/L (ref 98–111)
Creatinine: 1 mg/dL (ref 0.61–1.24)
GFR, Est AFR Am: >60 mL/min (ref >60)
GFR, Estimated: >60 mL/min (ref >60)
Glucose, Bld: 98 mg/dL (ref 70–99)
Potassium: 3.7 mmol/L (ref 3.5–5.1)
Sodium: 140 mmol/L (ref 135–145)
Total Bilirubin: 0.4 mg/dL (ref 0.3–1.2)
Total Protein: 8.1 g/dL (ref 6.5–8.1)

## 2018-05-21 LAB — CBC WITH DIFFERENTIAL/PLATELET
Abs Immature Granulocytes: 0.01 10*3/uL (ref 0.00–0.07)
Basophils Absolute: 0 10*3/uL (ref 0.0–0.1)
Basophils Relative: 0 %
Eosinophils Absolute: 0.3 10*3/uL (ref 0.0–0.5)
Eosinophils Relative: 4 %
HCT: 38 % — ABNORMAL LOW (ref 39.0–52.0)
Hemoglobin: 11.6 g/dL — ABNORMAL LOW (ref 13.0–17.0)
Immature Granulocytes: 0 %
Lymphocytes Relative: 57 %
Lymphs Abs: 5.1 10*3/uL — ABNORMAL HIGH (ref 0.7–4.0)
MCH: 23.6 pg — ABNORMAL LOW (ref 26.0–34.0)
MCHC: 30.5 g/dL (ref 30.0–36.0)
MCV: 77.2 fL — ABNORMAL LOW (ref 80.0–100.0)
Monocytes Absolute: 0.6 10*3/uL (ref 0.1–1.0)
Monocytes Relative: 6 %
Neutro Abs: 3 10*3/uL (ref 1.7–7.7)
Neutrophils Relative %: 33 %
Platelets: 228 10*3/uL (ref 150–400)
RBC: 4.92 MIL/uL (ref 4.22–5.81)
RDW: 14.7 % (ref 11.5–15.5)
WBC: 9 10*3/uL (ref 4.0–10.5)
nRBC: 0 % (ref 0.0–0.2)

## 2018-05-21 LAB — IRON AND TIBC
Iron: 52 ug/dL (ref 42–163)
Saturation Ratios: 21 % (ref 20–55)
TIBC: 243 ug/dL (ref 202–409)
UIBC: 191 ug/dL (ref 117–376)

## 2018-05-21 NOTE — Telephone Encounter (Signed)
Gave avs  ° °

## 2018-05-23 LAB — HEMOGLOBINOPATHY EVALUATION
Hgb A2 Quant: 1.9 % (ref 1.8–3.2)
Hgb A: 97.5 % (ref 96.4–98.8)
Hgb C: 0 %
Hgb F Quant: 0.6 % (ref 0.0–2.0)
Hgb S Quant: 0 %
Hgb Variant: 0 %

## 2018-05-29 ENCOUNTER — Other Ambulatory Visit: Payer: Self-pay | Admitting: Gastroenterology

## 2018-05-29 DIAGNOSIS — Z1211 Encounter for screening for malignant neoplasm of colon: Secondary | ICD-10-CM | POA: Diagnosis not present

## 2018-05-30 LAB — ALPHA-THALASSEMIA GENOTYPR

## 2018-07-05 ENCOUNTER — Ambulatory Visit (HOSPITAL_COMMUNITY)
Admission: RE | Admit: 2018-07-05 | Payer: BLUE CROSS/BLUE SHIELD | Source: Ambulatory Visit | Admitting: Gastroenterology

## 2018-07-05 ENCOUNTER — Encounter (HOSPITAL_COMMUNITY): Admission: RE | Payer: Self-pay | Source: Ambulatory Visit

## 2018-07-05 SURGERY — COLONOSCOPY WITH PROPOFOL
Anesthesia: Monitor Anesthesia Care

## 2018-10-11 ENCOUNTER — Other Ambulatory Visit: Payer: Self-pay | Admitting: Gastroenterology

## 2018-10-29 ENCOUNTER — Other Ambulatory Visit (HOSPITAL_COMMUNITY)
Admission: RE | Admit: 2018-10-29 | Discharge: 2018-10-29 | Disposition: A | Discharge status: Home / Self Care | Payer: BC Managed Care – PPO | Source: Ambulatory Visit | Attending: Gastroenterology | Admitting: Gastroenterology

## 2018-10-29 DIAGNOSIS — Z20828 Contact with and (suspected) exposure to other viral communicable diseases: Secondary | ICD-10-CM | POA: Diagnosis not present

## 2018-10-29 LAB — SARS CORONAVIRUS 2 (TAT 6-24 HRS): SARS Coronavirus 2: NEGATIVE

## 2018-10-31 NOTE — Progress Notes (Signed)
Pt states he has been at home quarantined since his covid test and has not been around any people not living in his home, pt understands to be at hospital at 630am for procedure

## 2018-11-01 ENCOUNTER — Other Ambulatory Visit: Payer: Self-pay

## 2018-11-01 ENCOUNTER — Ambulatory Visit (HOSPITAL_COMMUNITY): Payer: BC Managed Care – PPO | Admitting: Anesthesiology

## 2018-11-01 ENCOUNTER — Encounter (HOSPITAL_COMMUNITY)
Admission: RE | Disposition: A | Discharge status: Home / Self Care | Payer: Self-pay | Source: Ambulatory Visit | Attending: Gastroenterology

## 2018-11-01 ENCOUNTER — Encounter (HOSPITAL_COMMUNITY): Payer: Self-pay

## 2018-11-01 ENCOUNTER — Ambulatory Visit (HOSPITAL_COMMUNITY)
Admission: RE | Admit: 2018-11-01 | Discharge: 2018-11-01 | Disposition: A | Discharge status: Home / Self Care | Payer: BC Managed Care – PPO | Source: Ambulatory Visit | Attending: Gastroenterology | Admitting: Gastroenterology

## 2018-11-01 DIAGNOSIS — K635 Polyp of colon: Secondary | ICD-10-CM | POA: Insufficient documentation

## 2018-11-01 DIAGNOSIS — D123 Benign neoplasm of transverse colon: Secondary | ICD-10-CM | POA: Insufficient documentation

## 2018-11-01 DIAGNOSIS — Z1211 Encounter for screening for malignant neoplasm of colon: Secondary | ICD-10-CM | POA: Insufficient documentation

## 2018-11-01 DIAGNOSIS — D125 Benign neoplasm of sigmoid colon: Secondary | ICD-10-CM | POA: Diagnosis not present

## 2018-11-01 DIAGNOSIS — E119 Type 2 diabetes mellitus without complications: Secondary | ICD-10-CM | POA: Diagnosis not present

## 2018-11-01 DIAGNOSIS — Z79899 Other long term (current) drug therapy: Secondary | ICD-10-CM | POA: Diagnosis not present

## 2018-11-01 DIAGNOSIS — I10 Essential (primary) hypertension: Secondary | ICD-10-CM | POA: Insufficient documentation

## 2018-11-01 DIAGNOSIS — D120 Benign neoplasm of cecum: Secondary | ICD-10-CM | POA: Diagnosis not present

## 2018-11-01 HISTORY — PX: COLONOSCOPY WITH PROPOFOL: SHX5780

## 2018-11-01 HISTORY — PX: POLYPECTOMY: SHX5525

## 2018-11-01 LAB — GLUCOSE, CAPILLARY
Glucose-Capillary: 120 mg/dL — ABNORMAL HIGH (ref 70–99)
Glucose-Capillary: 102 mg/dL — ABNORMAL HIGH (ref 70–99)

## 2018-11-01 SURGERY — COLONOSCOPY WITH PROPOFOL
Anesthesia: Monitor Anesthesia Care

## 2018-11-01 MED ORDER — LACTATED RINGERS IV SOLN
INTRAVENOUS | Status: DC
  Administered 2018-11-01: 1000 mL via INTRAVENOUS

## 2018-11-01 MED ORDER — LIDOCAINE HCL (CARDIAC) PF 100 MG/5ML IV SOSY
PREFILLED_SYRINGE | INTRAVENOUS | Status: DC | PRN
  Administered 2018-11-01: 100 mg via INTRAVENOUS

## 2018-11-01 MED ORDER — PHENYLEPHRINE 40 MCG/ML (10ML) SYRINGE FOR IV PUSH (FOR BLOOD PRESSURE SUPPORT)
PREFILLED_SYRINGE | INTRAVENOUS | Status: DC | PRN
  Administered 2018-11-01: 80 ug via INTRAVENOUS

## 2018-11-01 MED ORDER — PROPOFOL 10 MG/ML IV BOLUS
INTRAVENOUS | Status: AC
  Filled 2018-11-01: qty 80

## 2018-11-01 MED ORDER — PROPOFOL 500 MG/50ML IV EMUL
INTRAVENOUS | Status: DC | PRN
  Administered 2018-11-01: 125 ug/kg/min via INTRAVENOUS

## 2018-11-01 MED ORDER — PROPOFOL 10 MG/ML IV BOLUS
INTRAVENOUS | Status: DC | PRN
  Administered 2018-11-01: 20 mg via INTRAVENOUS
  Administered 2018-11-01: 30 mg via INTRAVENOUS
  Administered 2018-11-01 (×2): 20 mg via INTRAVENOUS

## 2018-11-01 MED ORDER — PROPOFOL 10 MG/ML IV BOLUS
INTRAVENOUS | Status: AC
  Filled 2018-11-01: qty 20

## 2018-11-01 MED ORDER — SODIUM CHLORIDE 0.9 % IV SOLN: INTRAVENOUS | Status: DC

## 2018-11-01 MED ORDER — ONDANSETRON HCL 4 MG/2ML IJ SOLN
INTRAMUSCULAR | Status: DC | PRN
  Administered 2018-11-01: 4 mg via INTRAVENOUS

## 2018-11-01 SURGICAL SUPPLY — 22 items

## 2018-11-01 NOTE — Anesthesia Preprocedure Evaluation (Signed)
Anesthesia Evaluation  Patient identified by MRN, date of birth, ID band Patient awake    Reviewed: Allergy & Precautions, NPO status , Patient's Chart, lab work & pertinent test results  Airway Mallampati: II  TM Distance: >3 FB Neck ROM: Full    Dental  (+) Edentulous Upper, Edentulous Lower   Pulmonary    breath sounds clear to auscultation       Cardiovascular hypertension,  Rhythm:Regular Rate:Normal     Neuro/Psych    GI/Hepatic   Endo/Other  diabetes  Renal/GU      Musculoskeletal   Abdominal   Peds  Hematology   Anesthesia Other Findings   Reproductive/Obstetrics                             Anesthesia Physical Anesthesia Plan  ASA: III  Anesthesia Plan: MAC   Post-op Pain Management:    Induction: Intravenous  PONV Risk Score and Plan: Ondansetron  Airway Management Planned: Simple Face Mask and Natural Airway  Additional Equipment:   Intra-op Plan:   Post-operative Plan: Extubation in OR  Informed Consent: I have reviewed the patients History and Physical, chart, labs and discussed the procedure including the risks, benefits and alternatives for the proposed anesthesia with the patient or authorized representative who has indicated his/her understanding and acceptance.       Plan Discussed with: CRNA and Anesthesiologist  Anesthesia Plan Comments:         Anesthesia Quick Evaluation

## 2018-11-01 NOTE — Op Note (Signed)
[facility] Patient Name: Michael [name] Procedure Date: 11/01/2018 MRN: [id number] Attending MD: Carol Ada , MD Date of Birth: [birth year] CSN: [id number] Age: 63 Admit Type: Outpatient Procedure:                Colonoscopy Indications:              Screening for colorectal malignant neoplasm Providers:                Carol Ada, MD, Jeanella Cara, RN, Cherylynn Ridges, Technician, Hedy Camara Referring MD:              Medicines:                Propofol per Anesthesia Complications:            No immediate complications. Estimated Blood Loss:     Estimated blood loss: none. Procedure:                Pre-Anesthesia Assessment:                           - Prior to the procedure, a History and Physical                            was performed, and patient medications and                            allergies were reviewed. The patient's tolerance of                            previous anesthesia was also reviewed. The risks                            and benefits of the procedure and the sedation                            options and risks were discussed with the patient.                            All questions were answered, and informed consent                            was obtained. Prior Anticoagulants: The patient has                            taken no previous anticoagulant or antiplatelet                            agents. ASA Grade Assessment: III - A patient with                            severe systemic disease. After reviewing the risks  and benefits, the patient was deemed in                            satisfactory condition to undergo the procedure.                           - Sedation was administered by an anesthesia                            professional. Deep sedation was attained.                           After obtaining informed consent, the colonoscope   was passed under direct vision. Throughout the                            procedure, the patient's blood pressure, pulse, and                            oxygen saturations were monitored continuously. The                            CF-HQ190L ([id number]) Olympus colonoscope was                            introduced through the anus and advanced to the the                            cecum, identified by appendiceal orifice and                            ileocecal valve. The colonoscopy was performed                            without difficulty. The patient tolerated the                            procedure well. The quality of the bowel                            preparation was excellent. The ileocecal valve,                            appendiceal orifice, and rectum were photographed. Scope In: 7:38:24 AM Scope Out: 7:58:26 AM Scope Withdrawal Time: 0 hours 17 minutes 7 seconds  Total Procedure Duration: 0 hours 20 minutes 2 seconds  Findings:      Five sessile polyps were found in the sigmoid colon, transverse colon       and cecum. The polyps were 1 to 3 mm in size. These polyps were removed       with a cold snare. Resection and retrieval were complete. Impression:               - Five 1 to 3 mm polyps in the sigmoid colon, in  the transverse colon and in the cecum, removed with                            a cold snare. Resected and retrieved. Moderate Sedation:      Not Applicable - Patient had care per Anesthesia. Recommendation:           - Patient has a contact number available for                            emergencies. The signs and symptoms of potential                            delayed complications were discussed with the                            patient. Return to normal activities tomorrow.                            Written discharge instructions were provided to the                            patient.                           - Resume previous  diet.                           - Continue present medications.                           - Await pathology results.                           - Repeat colonoscopy in 3 years for surveillance. Procedure Code(s):        --- Professional ---                           [phone], Colonoscopy, flexible; with removal of                            tumor(s), polyp(s), or other lesion(s) by snare                            technique Diagnosis Code(s):        --- Professional ---                           Z12.11, Encounter for screening for malignant                            neoplasm of colon                           K63.5, Polyp of colon CPT copyright 2019 American Medical Association. All rights reserved. The codes documented in this report are preliminary and upon coder review may  be revised to meet  current compliance requirements. Carol Ada, MD Carol Ada, MD 11/01/2018 8:07:08 AM This report has been signed electronically. Number of Addenda: 0

## 2018-11-01 NOTE — H&P (Signed)
  Michael [name] HPI: At this time the patient denies any problems with nausea, vomiting, fevers, chills, abdominal pain, diarrhea, constipation, hematochezia, melena, GERD, or dysphagia. The patient denies any known family history of colon cancers. No complaints of chest pain, SOB, MI, or sleep apnea. He had a normal colonoscopy on 11/28/2007.  Past Medical History:  Diagnosis Date  . Diabetes mellitus without complication ([place])   . Hypertension     Past Surgical History:  Procedure Laterality Date  . KNEE ARTHROSCOPY      History reviewed. No pertinent family history.  Social History:  reports that he has never smoked. He has never used smokeless tobacco. He reports previous alcohol use. He reports that he does not use drugs.  Allergies: No Known Allergies  Medications:  Scheduled:  Continuous: . sodium chloride    . lactated ringers 1,000 mL (11/01/18 0701)    Results for orders placed or performed during the hospital encounter of 11/01/18 (from the past 24 hour(s))  Glucose, capillary     Status: Abnormal   Collection Time: 11/01/18  7:11 AM  Result Value Ref Range   Glucose-Capillary 120 (H) 70 - 99 mg/dL     No results found.  ROS:  As stated above in the HPI otherwise negative.  Blood pressure (!) 194/89, pulse 97, temperature 97.7 F (36.5 C), temperature source Oral, resp. rate 18, height 6' 3.25" (1.911 m), weight (!) 174.6 kg, SpO2 98 %.    PE: Gen: NAD, Alert and Oriented HEENT:  [place]/AT, EOMI Neck: Supple, no LAD Lungs: CTA Bilaterally CV: RRR without M/G/R ABM: Soft, NTND, +BS Ext: No C/C/E  Assessment/Plan: 1) Screening colonoscopy.  Yago Ludvigsen D 11/01/2018, 7:23 AM

## 2018-11-01 NOTE — Transfer of Care (Signed)
Immediate Anesthesia Transfer of Care Note  Patient: Michael [name]  Procedure(s) Performed: COLONOSCOPY WITH PROPOFOL (N/A ) POLYPECTOMY  Patient Location: PACU  Anesthesia Type:MAC  Level of Consciousness: awake, alert , oriented and patient cooperative  Airway & Oxygen Therapy: Patient Spontanous Breathing and Patient connected to nasal cannula oxygen  Post-op Assessment: Report given to RN and Post -op Vital signs reviewed and stable  Post vital signs: Reviewed and stable  Last Vitals:  Vitals Value Taken Time  BP 154/79 11/01/18 0830  Temp 36.5 C 11/01/18 0810  Pulse 78 11/01/18 0830  Resp 22 11/01/18 0830  SpO2 98 % 11/01/18 0830    Last Pain:  Vitals:   11/01/18 0830  TempSrc:   PainSc: 0-No pain         Complications: No apparent anesthesia complications

## 2018-11-01 NOTE — Anesthesia Postprocedure Evaluation (Signed)
Anesthesia Post Note  Patient: Michael [name]  Procedure(s) Performed: COLONOSCOPY WITH PROPOFOL (N/A ) POLYPECTOMY     Patient location during evaluation: PACU Anesthesia Type: MAC Level of consciousness: awake and alert Pain management: pain level controlled Vital Signs Assessment: post-procedure vital signs reviewed and stable Respiratory status: spontaneous breathing, nonlabored ventilation, respiratory function stable and patient connected to nasal cannula oxygen Cardiovascular status: stable and blood pressure returned to baseline Postop Assessment: no apparent nausea or vomiting Anesthetic complications: no    Last Vitals:  Vitals:   11/01/18 0820 11/01/18 0830  BP: (!) 144/80 (!) 154/79  Pulse: 79 78  Resp: (!) 21 (!) 22  Temp:    SpO2: 97% 98%    Last Pain:  Vitals:   11/01/18 0830  TempSrc:   PainSc: 0-No pain                 Carlye Panameno COKER

## 2018-11-01 NOTE — Discharge Instructions (Signed)
Colonoscopy, Adult, Care After This sheet gives you information about how to care for yourself after your procedure. Your doctor may also give you more specific instructions. If you have problems or questions, call your doctor. What can I expect after the procedure? After the procedure, it is common to have:  A small amount of blood in your poop for 24 hours.  Some gas.  Mild cramping or bloating in your belly. Follow these instructions at home: General instructions  For the first 24 hours after the procedure: ? Do not drive or use machinery. ? Do not sign important documents. ? Do not drink alcohol. ? Do your daily activities more slowly than normal. ? Eat foods that are soft and easy to digest.  Take over-the-counter or prescription medicines only as told by your doctor. To help cramping and bloating:   Try walking around.  Put heat on your belly (abdomen) as told by your doctor. Use a heat source that your doctor recommends, such as a moist heat pack or a heating pad. ? Put a towel between your skin and the heat source. ? Leave the heat on for 20-30 minutes. ? Remove the heat if your skin turns bright red. This is especially important if you cannot feel pain, heat, or cold. You can get burned. Eating and drinking   Drink enough fluid to keep your pee (urine) clear or pale yellow.  Return to your normal diet as told by your doctor. Avoid heavy or fried foods that are hard to digest.  Avoid drinking alcohol for as long as told by your doctor. Contact a doctor if:  You have blood in your poop (stool) 2-3 days after the procedure. Get help right away if:  You have more than a small amount of blood in your poop.  You see large clumps of tissue (blood clots) in your poop.  Your belly is swollen.  You feel sick to your stomach (nauseous).  You throw up (vomit).  You have a fever.  You have belly pain that gets worse, and medicine does not help your  pain. Summary  After the procedure, it is common to have a small amount of blood in your poop. You may also have mild cramping and bloating in your belly.  For the first 24 hours after the procedure, do not drive or use machinery, do not sign important documents, and do not drink alcohol.  Get help right away if you have a lot of blood in your poop, feel sick to your stomach, have a fever, or have more belly pain. This information is not intended to replace advice given to you by your health care provider. Make sure you discuss any questions you have with your health care provider. Document Released: 04/22/2010 Document Revised: 01/18/2017 Document Reviewed: 12/13/2015 Elsevier Patient Education  2020 [employer].

## 2018-11-04 ENCOUNTER — Encounter (HOSPITAL_COMMUNITY): Payer: Self-pay | Admitting: Gastroenterology

## 2018-11-13 ENCOUNTER — Ambulatory Visit: Payer: BLUE CROSS/BLUE SHIELD | Admitting: Nurse Practitioner

## 2018-11-20 ENCOUNTER — Other Ambulatory Visit: Payer: Self-pay

## 2018-11-20 ENCOUNTER — Ambulatory Visit (INDEPENDENT_AMBULATORY_CARE_PROVIDER_SITE_OTHER): Payer: BC Managed Care – PPO | Admitting: Nurse Practitioner

## 2018-11-20 ENCOUNTER — Encounter: Payer: Self-pay | Admitting: Nurse Practitioner

## 2018-11-20 VITALS — BP 138/72 | HR 95 | Temp 98.6°F | Ht 75.0 in | Wt 396.0 lb

## 2018-11-20 DIAGNOSIS — E782 Mixed hyperlipidemia: Secondary | ICD-10-CM

## 2018-11-20 DIAGNOSIS — M1990 Unspecified osteoarthritis, unspecified site: Secondary | ICD-10-CM | POA: Diagnosis not present

## 2018-11-20 DIAGNOSIS — H548 Legal blindness, as defined in USA: Secondary | ICD-10-CM

## 2018-11-20 DIAGNOSIS — I10 Essential (primary) hypertension: Secondary | ICD-10-CM

## 2018-11-20 DIAGNOSIS — Z6841 Body Mass Index (BMI) 40.0 and over, adult: Secondary | ICD-10-CM

## 2018-11-20 DIAGNOSIS — D649 Anemia, unspecified: Secondary | ICD-10-CM | POA: Diagnosis not present

## 2018-11-20 DIAGNOSIS — E0843 Diabetes mellitus due to underlying condition with diabetic autonomic (poly)neuropathy: Secondary | ICD-10-CM | POA: Diagnosis not present

## 2018-11-20 NOTE — Patient Instructions (Signed)
Increase physical activity as tolerates, goal is 30 mins 5 days a week.    DASH Eating Plan DASH stands for "Dietary Approaches to Stop Hypertension." The DASH eating plan is a healthy eating plan that has been shown to reduce high blood pressure (hypertension). It may also reduce your risk for type 2 diabetes, heart disease, and stroke. The DASH eating plan may also help with weight loss. What are tips for following this plan?  General guidelines  Avoid eating more than 2,300 mg (milligrams) of salt (sodium) a day. If you have hypertension, you may need to reduce your sodium intake to 1,500 mg a day.  Limit alcohol intake to no more than 1 drink a day for nonpregnant women and 2 drinks a day for men. One drink equals 12 oz of beer, 5 oz of wine, or 1 oz of hard liquor.  Work with your health care provider to maintain a healthy body weight or to lose weight. Ask what an ideal weight is for you.  Get at least 30 minutes of exercise that causes your heart to beat faster (aerobic exercise) most days of the week. Activities may include walking, swimming, or biking.  Work with your health care provider or diet and nutrition specialist (dietitian) to adjust your eating plan to your individual calorie needs. Reading food labels   Check food labels for the amount of sodium per serving. Choose foods with less than 5 percent of the Daily Value of sodium. Generally, foods with less than 300 mg of sodium per serving fit into this eating plan.  To find whole grains, look for the word "whole" as the first word in the ingredient list. Shopping  Buy products labeled as "low-sodium" or "no salt added."  Buy fresh foods. Avoid canned foods and premade or frozen meals. Cooking  Avoid adding salt when cooking. Use salt-free seasonings or herbs instead of table salt or sea salt. Check with your health care provider or pharmacist before using salt substitutes.  Do not fry foods. Cook foods using healthy  methods such as baking, boiling, grilling, and broiling instead.  Cook with heart-healthy oils, such as olive, canola, soybean, or sunflower oil. Meal planning  Eat a balanced diet that includes: ? 5 or more servings of fruits and vegetables each day. At each meal, try to fill half of your plate with fruits and vegetables. ? Up to 6-8 servings of whole grains each day. ? Less than 6 oz of lean meat, poultry, or fish each day. A 3-oz serving of meat is about the same size as a deck of cards. One egg equals 1 oz. ? 2 servings of low-fat dairy each day. ? A serving of nuts, seeds, or beans 5 times each week. ? Heart-healthy fats. Healthy fats called Omega-3 fatty acids are found in foods such as flaxseeds and coldwater fish, like sardines, salmon, and mackerel.  Limit how much you eat of the following: ? Canned or prepackaged foods. ? Food that is high in trans fat, such as fried foods. ? Food that is high in saturated fat, such as fatty meat. ? Sweets, desserts, sugary drinks, and other foods with added sugar. ? Full-fat dairy products.  Do not salt foods before eating.  Try to eat at least 2 vegetarian meals each week.  Eat more home-cooked food and less restaurant, buffet, and fast food.  When eating at a restaurant, ask that your food be prepared with less salt or no salt, if possible. What foods  are recommended? The items listed may not be a complete list. Talk with your dietitian about what dietary choices are best for you. Grains Whole-grain or whole-wheat bread. Whole-grain or whole-wheat pasta. Brown rice. [place]row. Bulgur. Whole-grain and low-sodium cereals. Pita bread. Low-fat, low-sodium crackers. Whole-wheat flour tortillas. Vegetables Fresh or frozen vegetables (raw, steamed, roasted, or grilled). Low-sodium or reduced-sodium tomato and vegetable juice. Low-sodium or reduced-sodium tomato sauce and tomato paste. Low-sodium or reduced-sodium canned  vegetables. Fruits All fresh, dried, or frozen fruit. Canned fruit in natural juice (without added sugar). Meat and other protein foods Skinless chicken or [place]. Ground chicken or [place]. Pork with fat trimmed off. Fish and seafood. Egg whites. Dried beans, peas, or lentils. Unsalted nuts, nut butters, and seeds. Unsalted canned beans. Lean cuts of beef with fat trimmed off. Low-sodium, lean deli meat. Dairy Low-fat (1%) or fat-free (skim) milk. Fat-free, low-fat, or reduced-fat cheeses. Nonfat, low-sodium ricotta or cottage cheese. Low-fat or nonfat yogurt. Low-fat, low-sodium cheese. Fats and oils Soft margarine without trans fats. Vegetable oil. Low-fat, reduced-fat, or light mayonnaise and salad dressings (reduced-sodium). Canola, safflower, olive, soybean, and sunflower oils. Avocado. Seasoning and other foods Herbs. Spices. Seasoning mixes without salt. Unsalted popcorn and pretzels. Fat-free sweets. What foods are not recommended? The items listed may not be a complete list. Talk with your dietitian about what dietary choices are best for you. Grains Baked goods made with fat, such as croissants, muffins, or some breads. Dry pasta or rice meal packs. Vegetables Creamed or fried vegetables. Vegetables in a cheese sauce. Regular canned vegetables (not low-sodium or reduced-sodium). Regular canned tomato sauce and paste (not low-sodium or reduced-sodium). Regular tomato and vegetable juice (not low-sodium or reduced-sodium). Angie Fava. Olives. Fruits Canned fruit in a light or heavy syrup. Fried fruit. Fruit in cream or butter sauce. Meat and other protein foods Fatty cuts of meat. Ribs. Fried meat. Berniece Salines. Sausage. Bologna and other processed lunch meats. Salami. Fatback. Hotdogs. Bratwurst. Salted nuts and seeds. Canned beans with added salt. Canned or smoked fish. Whole eggs or egg yolks. Chicken or [place] with skin. Dairy Whole or 2% milk, cream, and half-and-half. Whole or full-fat  cream cheese. Whole-fat or sweetened yogurt. Full-fat cheese. Nondairy creamers. Whipped toppings. Processed cheese and cheese spreads. Fats and oils Butter. Stick margarine. Lard. Shortening. Ghee. Bacon fat. Tropical oils, such as coconut, palm kernel, or palm oil. Seasoning and other foods Salted popcorn and pretzels. Onion salt, garlic salt, seasoned salt, table salt, and sea salt. Worcestershire sauce. Tartar sauce. Barbecue sauce. Teriyaki sauce. Soy sauce, including reduced-sodium. Steak sauce. Canned and packaged gravies. Fish sauce. Oyster sauce. Cocktail sauce. Horseradish that you find on the shelf. Ketchup. Mustard. Meat flavorings and tenderizers. Bouillon cubes. Hot sauce and Tabasco sauce. Premade or packaged marinades. Premade or packaged taco seasonings. Relishes. Regular salad dressings. Where to find more information:  National Heart, Lung, and [place]: https://wilson-eaton.com/  American Heart Association: www.heart.org Summary  The DASH eating plan is a healthy eating plan that has been shown to reduce high blood pressure (hypertension). It may also reduce your risk for type 2 diabetes, heart disease, and stroke.  With the DASH eating plan, you should limit salt (sodium) intake to 2,300 mg a day. If you have hypertension, you may need to reduce your sodium intake to 1,500 mg a day.  When on the DASH eating plan, aim to eat more fresh fruits and vegetables, whole grains, lean proteins, low-fat dairy, and heart-healthy fats.  Work with your  health care provider or diet and nutrition specialist (dietitian) to adjust your eating plan to your individual calorie needs. This information is not intended to replace advice given to you by your health care provider. Make sure you discuss any questions you have with your health care provider. Document Released: 03/09/2011 Document Revised: 03/02/2017 Document Reviewed: 03/13/2016 Elsevier Patient Education  2020 [employer].

## 2018-11-20 NOTE — Progress Notes (Signed)
Careteam: Patient Care Team: Michael Bacon, NP as PCP - General (Family Medicine)  Advanced Directive information Does Patient Have a Medical Advance Directive?: No, Would patient like information on creating a medical advance directive?: No - Patient declined  No Known Allergies  Chief Complaint  Patient presents with  . Establish Care    New patient establish care   . Immunizations    Refused flu vaccine, never received and doesn't plan on starting   . Medication Management    Pill bottles confirmed      HPI: Patient is a 63 y.o. male seen in the office today to establish care.  States he was at [facility] but was a patient [name] a while ago and now coming back.  Last physical was done in September 2019.   Hypertension- does not check blood pressure at home, does not like the one he has, feels like he needs to get a new one. Has taken medication today. Taking norvasc 10 mg daily and HCTZ 25 mg daily. Does not use salt unless it is the food already  Hyperlipidemia- continues on Lipitor 20 mg daily, does not follow dietary modifications.   Diabetes- not currently on medication   Has not been seen in almost a year.   Hx of seizures- as a child, has not been on medication for years. Last seizure was around 1974. Was told by one doctor he probably never had it. Does not follow with neurologist.   Last colonoscopy 3 weeks ago, 5 polyps all benign- 7 years for next colonoscopy per pt.    Chest pain- rare at this time. Has has chest pain since 1980s and no one knows why. Has had multiple stress test, ekgs and everyone says "my heart is fine" overdue for cardiology follow up. Has not been since 2006.  Left knee pain- reports he needs to follow up with orthopedic, Dr Noemi Chapel  Not doing any exercise.   Review of Systems:  Review of Systems  Constitutional: Negative for chills, fever and weight loss.  HENT: Negative for tinnitus.   Eyes:       Macular degeneration - legally  blind  Dry eyes  Respiratory: Negative for cough, sputum production and shortness of breath.   Cardiovascular: Negative for chest pain, palpitations and leg swelling.  Gastrointestinal: Negative for abdominal pain, constipation, diarrhea and heartburn.  Genitourinary: Negative for dysuria, frequency and urgency.  Musculoskeletal: Positive for back pain (when standing too long, hx of trauma ) and joint pain. Negative for falls and myalgias.  Skin: Negative.   Neurological: Negative for dizziness and headaches.  Psychiatric/Behavioral: Negative for depression and memory loss. The patient does not have insomnia.     Past Medical History:  Diagnosis Date  . Diabetes mellitus without complication ([place])   . Epilepsy ([place])   . Hypertension   . Type 2 diabetes mellitus ([place])    Past Surgical History:  Procedure Laterality Date  . COLONOSCOPY WITH PROPOFOL N/A 11/01/2018   Procedure: COLONOSCOPY WITH PROPOFOL;  Surgeon: Carol Ada, MD;  Location: [facility];  Service: Endoscopy;  Laterality: N/A;  . KNEE ARTHROSCOPY Right 2005   Dr Shirlean Kelly  . POLYPECTOMY  11/01/2018   Procedure: POLYPECTOMY;  Surgeon: Carol Ada, MD;  Location: [facility];  Service: Endoscopy;;   Social History:   reports that he quit smoking about 13 years ago. He has never used smokeless tobacco. He reports previous alcohol use. He reports that he does not use drugs.  No  family history on file.  Medications: Patient's Medications  New Prescriptions   No medications on file  Previous Medications   AMLODIPINE (NORVASC) 10 MG TABLET    Take 10 mg by mouth daily.   ATORVASTATIN (LIPITOR) 20 MG TABLET    Take 20 mg by mouth daily.   HYDROCHLOROTHIAZIDE (HYDRODIURIL) 25 MG TABLET    Take 25 mg by mouth daily.  Modified Medications   No medications on file  Discontinued Medications   No medications on file    Physical Exam:  Vitals:   11/20/18 1312  BP: (!) 142/80  Pulse: 95  Temp: 98.6 F (37 C)   TempSrc: Oral  SpO2: 98%  Weight: (!) 396 lb (179.6 kg)  Height: 6' 3"  (1.905 m)   Body mass index is 49.5 kg/m. Wt Readings from Last 3 Encounters:  11/20/18 (!) 396 lb (179.6 kg)  11/01/18 (!) 385 lb (174.6 kg)  05/21/18 (!) 383 lb 9.6 oz (174 kg)    Physical Exam Constitutional:      General: He is not in acute distress.    Appearance: He is well-developed. He is not diaphoretic.  HENT:     Head: Normocephalic and atraumatic.  Neck:     Musculoskeletal: Normal range of motion and neck supple.  Cardiovascular:     Rate and Rhythm: Normal rate and regular rhythm.     Heart sounds: Normal heart sounds.  Pulmonary:     Effort: Pulmonary effort is normal.     Breath sounds: Normal breath sounds.  Abdominal:     General: Bowel sounds are normal.     Palpations: Abdomen is soft.     Comments: obese  Musculoskeletal:        General: No tenderness.  Skin:    General: Skin is warm and dry.  Neurological:     Mental Status: He is alert and oriented to person, place, and time.  Psychiatric:        Mood and Affect: Mood normal.        Behavior: Behavior normal.     Labs reviewed: Basic Metabolic Panel: Recent Labs    01/12/18 1704 05/21/18 1351  NA 137 140  K 3.7 3.7  CL 103 103  CO2 26 29  GLUCOSE 105* 98  BUN 12 12  CREATININE 1.01 1.00  CALCIUM 9.3 9.3   Liver Function Tests: Recent Labs    05/21/18 1351  AST 22  ALT 12  ALKPHOS 112  BILITOT 0.4  PROT 8.1  ALBUMIN 4.0   No results for input(s): LIPASE, AMYLASE in the last 8760 hours. No results for input(s): AMMONIA in the last 8760 hours. CBC: Recent Labs    01/12/18 1704 05/21/18 1351  WBC 10.0 9.0  NEUTROABS  --  3.0  HGB 11.9* 11.6*  HCT 39.7 38.0*  MCV 78.5* 77.2*  PLT 256 228   Lipid Panel: No results for input(s): CHOL, HDL, LDLCALC, TRIG, CHOLHDL, LDLDIRECT in the last 8760 hours. TSH: No results for input(s): TSH in the last 8760 hours. A1C: No results found for: HGBA1C    Assessment/Plan 1. Diabetes mellitus due to underlying condition with diabetic autonomic neuropathy, unspecified whether long term insulin use ([place]) -encouraged diet education, he states he does eat a lot of sweets, not currently on medication.  - CMP with eGFR([facility]); Future - Hemoglobin A1c; Future  2. Morbid obesity with BMI of 45.0-49.9, adult ([place]) -weight loss encouraged through increase in activity as tolerates and dietary modifications  3.  Anemia, unspecified type -workup by hematology due to microcytic anemia.  - CBC with Differential/Platelet; Future  4. Arthritis Stable, occasionally will have worsening left knee pain - CMP with eGFR([facility]); Future - CBC with Differential/Platelet; Future  5. Essential hypertension -improved on recheck, diet information provided. Encouraged dietary modifications with medication compliance.  - CMP with eGFR([facility]); Future - CBC with Differential/Platelet; Future  6. Mixed hyperlipidemia -continues on lipitor.  - Lipid Panel; Future  Next appt: 6 weeks for CPE with fasting labs prior  Jessica K. Meeker, [facility] [phone]

## 2019-01-13 ENCOUNTER — Other Ambulatory Visit: Payer: Self-pay

## 2019-01-13 ENCOUNTER — Other Ambulatory Visit: Payer: BC Managed Care – PPO

## 2019-01-13 DIAGNOSIS — E0843 Diabetes mellitus due to underlying condition with diabetic autonomic (poly)neuropathy: Secondary | ICD-10-CM

## 2019-01-13 DIAGNOSIS — D649 Anemia, unspecified: Secondary | ICD-10-CM | POA: Diagnosis not present

## 2019-01-13 DIAGNOSIS — I10 Essential (primary) hypertension: Secondary | ICD-10-CM

## 2019-01-13 DIAGNOSIS — M1990 Unspecified osteoarthritis, unspecified site: Secondary | ICD-10-CM

## 2019-01-13 DIAGNOSIS — E782 Mixed hyperlipidemia: Secondary | ICD-10-CM

## 2019-01-14 LAB — CBC WITH DIFFERENTIAL/PLATELET
Absolute Monocytes: 435 cells/uL (ref 200–950)
Basophils Absolute: 44 cells/uL (ref 0–200)
Basophils Relative: 0.5 %
Eosinophils Absolute: 252 cells/uL (ref 15–500)
Eosinophils Relative: 2.9 %
HCT: 38.4 % — ABNORMAL LOW (ref 38.5–50.0)
Hemoglobin: 11.7 g/dL — ABNORMAL LOW (ref 13.2–17.1)
Lymphs Abs: 4211 cells/uL — ABNORMAL HIGH (ref 850–3900)
MCH: 23.6 pg — ABNORMAL LOW (ref 27.0–33.0)
MCHC: 30.5 g/dL — ABNORMAL LOW (ref 32.0–36.0)
MCV: 77.6 fL — ABNORMAL LOW (ref 80.0–100.0)
MPV: 12.5 fL (ref 7.5–12.5)
Monocytes Relative: 5 %
Neutro Abs: 3758 cells/uL (ref 1500–7800)
Neutrophils Relative %: 43.2 %
Platelets: 235 10*3/uL (ref 140–400)
RBC: 4.95 10*6/uL (ref 4.20–5.80)
RDW: 13.9 % (ref 11.0–15.0)
Total Lymphocyte: 48.4 %
WBC: 8.7 10*3/uL (ref 3.8–10.8)

## 2019-01-14 LAB — COMPLETE METABOLIC PANEL WITH GFR
AG Ratio: 1.1 (calc) (ref 1.0–2.5)
ALT: 13 U/L (ref 9–46)
AST: 23 U/L (ref 10–35)
Albumin: 3.9 g/dL (ref 3.6–5.1)
Alkaline phosphatase (APISO): 111 U/L (ref 35–144)
BUN: 11 mg/dL (ref 7–25)
CO2: 28 mmol/L (ref 20–32)
Calcium: 9.5 mg/dL (ref 8.6–10.3)
Chloride: 101 mmol/L (ref 98–110)
Creat: 1.05 mg/dL (ref 0.70–1.25)
GFR, Est African American: 88 mL/min/{1.73_m2} (ref >=60)
GFR, Est Non African American: 76 mL/min/{1.73_m2} (ref >=60)
Globulin: 3.5 g/dL (calc) (ref 1.9–3.7)
Glucose, Bld: 153 mg/dL — ABNORMAL HIGH (ref 65–99)
Potassium: 4 mmol/L (ref 3.5–5.3)
Sodium: 138 mmol/L (ref 135–146)
Total Bilirubin: 0.4 mg/dL (ref 0.2–1.2)
Total Protein: 7.4 g/dL (ref 6.1–8.1)

## 2019-01-14 LAB — LIPID PANEL
Cholesterol: 123 mg/dL (ref <200)
HDL: 40 mg/dL (ref >=40)
LDL Cholesterol (Calc): 68 mg/dL (calc)
Non-HDL Cholesterol (Calc): 83 mg/dL (calc) (ref <130)
Total CHOL/HDL Ratio: 3.1 (calc) (ref <5.0)
Triglycerides: 66 mg/dL (ref <150)

## 2019-01-14 LAB — HEMOGLOBIN A1C
Hgb A1c MFr Bld: 8.4 % of total Hgb — ABNORMAL HIGH (ref <5.7)
Mean Plasma Glucose: 194 (calc)
eAG (mmol/L): 10.8 (calc)

## 2019-01-15 ENCOUNTER — Ambulatory Visit (INDEPENDENT_AMBULATORY_CARE_PROVIDER_SITE_OTHER): Payer: BC Managed Care – PPO | Admitting: Nurse Practitioner

## 2019-01-15 ENCOUNTER — Encounter: Payer: Self-pay | Admitting: Nurse Practitioner

## 2019-01-15 ENCOUNTER — Other Ambulatory Visit: Payer: Self-pay

## 2019-01-15 VITALS — BP 140/88 | HR 87 | Temp 98.0°F | Resp 20 | Ht 75.0 in | Wt 393.4 lb

## 2019-01-15 DIAGNOSIS — I10 Essential (primary) hypertension: Secondary | ICD-10-CM | POA: Diagnosis not present

## 2019-01-15 DIAGNOSIS — E0843 Diabetes mellitus due to underlying condition with diabetic autonomic (poly)neuropathy: Secondary | ICD-10-CM

## 2019-01-15 DIAGNOSIS — E782 Mixed hyperlipidemia: Secondary | ICD-10-CM

## 2019-01-15 DIAGNOSIS — Z23 Encounter for immunization: Secondary | ICD-10-CM | POA: Diagnosis not present

## 2019-01-15 DIAGNOSIS — Z0000 Encounter for general adult medical examination without abnormal findings: Secondary | ICD-10-CM

## 2019-01-15 DIAGNOSIS — D649 Anemia, unspecified: Secondary | ICD-10-CM

## 2019-01-15 DIAGNOSIS — Z1159 Encounter for screening for other viral diseases: Secondary | ICD-10-CM

## 2019-01-15 DIAGNOSIS — Z6841 Body Mass Index (BMI) 40.0 and over, adult: Secondary | ICD-10-CM

## 2019-01-15 MED ORDER — HYDROCHLOROTHIAZIDE 25 MG PO TABS: 25.0000 mg | ORAL_TABLET | Freq: Every day | ORAL | 1 refills | Status: DC

## 2019-01-15 MED ORDER — ATORVASTATIN CALCIUM 20 MG PO TABS: 20.0000 mg | ORAL_TABLET | Freq: Every day | ORAL | 1 refills | Status: DC

## 2019-01-15 MED ORDER — AMLODIPINE BESYLATE 10 MG PO TABS: 10.0000 mg | ORAL_TABLET | Freq: Every day | ORAL | 1 refills | Status: DC

## 2019-01-15 NOTE — Progress Notes (Signed)
Provider: Lauree Chandler, NP  Patient Care Team: Lauree Chandler, NP as PCP - General (Geriatric Medicine) Carol Ada, MD as Consulting Physician (Gastroenterology) Brunetta Genera, MD as Consulting Physician (Hematology) Elsie Saas, MD as Consulting Physician (Orthopedic Surgery)  Extended Emergency Contact Information Primary Emergency Contact: [facility] Address: [address]          [place], [place] [address] Johnnette Litter of [place] Phone: [phone] Mobile Phone: [phone] Relation: Spouse No Known Allergies Code Status: FULL Goals of Care: Advanced Directive information Advanced Directives 11/20/2018  Does Patient Have a Medical Advance Directive? No  Would patient like information on creating a medical advance directive? No - Patient declined     Chief Complaint  Patient presents with  . Annual Exam    HPI: Patient is a 63 y.o. [name] seen in today for an annual wellness exam.    Diet? Trying to cut out all processed food, low sodium diet. Wife eats a lot of sweets.   Exercise? None, plans to start using bike   Dentition: dentures top and bottom, does not go to dentist  Ophthalmology appt: legally blind since 63 years old. eyes unmeasurable.   Routine specialist: hematologist for anemia however never heard back. Cardiologist for chest pain- ongoing since the 80s. Last follow up with 2006.  Anemia- not on supplements, stable. Has not followed up with hematologist, unsure what was recommended.  Diabetes- hgb a1c 8.4, felt like it was getting worse.  Not willing to take medication. Not on a ACE or arb for renal protection.   Hyperlipidemia- LDL at goal 68, continues on lipitor 20 mg by mouth daily   Hypertension- forgets to take blood pressure medication sometimes, did not take it today-he has norvasc and hctz prescribed daily.  Never had a flu shot.   Unsure if he has been screened for HIV/hep C Declines HIV screening.   Had  colonoscopy July 31st, repeat in 5-7 years.    Depression screen [facility] 2/9 11/20/2018 07/09/2017  Decreased Interest 0 0  Down, Depressed, Hopeless - 0  PHQ - 2 Score 0 0    Fall Risk  11/20/2018 07/09/2017  Falls in the past year? 0 No  Number falls in past yr: 0 -  Injury with Fall? 0 -   MMSE - [place] Exam 01/15/2019  Not completed: (No Data)  Orientation to time 5  Orientation to Place 5  Registration 3  Attention/ Calculation 5  Recall 3  Language- name 2 objects 2  Language- repeat 1  Language- follow 3 step command 3  Language- read & follow direction 1  Write a sentence 1  Copy design 1  Total score 30     Health Maintenance  Topic Date Due  . Hepatitis C Screening  1956/01/11  . PNEUMOCOCCAL POLYSACCHARIDE VACCINE AGE 83-64 HIGH RISK  12/25/1958  . FOOT EXAM  12/25/1966  . OPHTHALMOLOGY EXAM  12/25/1966  . URINE MICROALBUMIN  12/25/1966  . HIV Screening  12/25/1971  . INFLUENZA VACCINE  11/02/2018  . HEMOGLOBIN A1C  07/14/2019  . TETANUS/TDAP  11/11/2025  . COLONOSCOPY  10/31/2028    Past Medical History:  Diagnosis Date  . Diabetes mellitus without complication ([place])   . Epilepsy ([place])   . Hypertension   . Type 2 diabetes mellitus ([place])     Past Surgical History:  Procedure Laterality Date  . COLONOSCOPY WITH PROPOFOL N/A 11/01/2018   Procedure: COLONOSCOPY WITH PROPOFOL;  Surgeon: Carol Ada, MD;  Location: [facility] ENDOSCOPY;  Service: Endoscopy;  Laterality: N/A;  . KNEE ARTHROSCOPY Right 2005   Dr Shirlean Kelly  . POLYPECTOMY  11/01/2018   Procedure: POLYPECTOMY;  Surgeon: Carol Ada, MD;  Location: [facility];  Service: Endoscopy;;    Social History   Socioeconomic History  . Marital status: Married    Spouse name: Not on file  . Number of children: Not on file  . Years of education: Not on file  . Highest education level: Not on file  Occupational History  . Not on file  Social Needs  . Financial resource strain: Not on file  .  Food insecurity    Worry: Not on file    Inability: Not on file  . Transportation needs    Medical: Not on file    Non-medical: Not on file  Tobacco Use  . Smoking status: Former Smoker    Quit date: 12/02/2004    Years since quitting: 14.1  . Smokeless tobacco: Never Used  . Tobacco comment: 8-10 a day off/on for 30 years  Substance and Sexual Activity  . Alcohol use: Not Currently  . Drug use: Never  . Sexual activity: Not on file  Lifestyle  . Physical activity    Days per week: Not on file    Minutes per session: Not on file  . Stress: Not on file  Relationships  . Social Herbalist on phone: Not on file    Gets together: Not on file    Attends religious service: Not on file    Active member of club or organization: Not on file    Attends meetings of clubs or organizations: Not on file    Relationship status: Not on file  Other Topics Concern  . Not on file  Social History Narrative   Social History      Diet? N/A      Do you drink/eat things with caffeine? Coffee tea      Marital status?         Married                            What year were you married? 2019      Do you live in a house, apartment, assisted living, condo, trailer, etc.? house      Is it one or more stories? 2      How many persons live in your home? 2      Do you have any pets in your home? (please list) 4 dogs       Highest level of education completed? Some college       Current or past profession: Geophysical data processor       Do you exercise?            Not much now                           Type & how often?      Advanced Directives      Do you have a living will? No       Do you have a DNR form?                                  If not, do you want to discuss one? No  Do you have signed POA/HPOA for forms? No       Functional Status      Do you have difficulty bathing or dressing yourself? no      Do you have difficulty preparing food or eating? No       Do you  have difficulty managing your medications? No       Do you have difficulty managing your finances? No       Do you have difficulty affording your medications? No        No family history on file. "does not talk to my family that much"    Review of Systems:  Review of Systems  Constitutional: Negative for chills, fever and weight loss.  HENT: Negative for tinnitus.   Eyes:       Macular degeneration - legally blind  Dry eyes  Respiratory: Negative for cough, sputum production and shortness of breath.   Cardiovascular: Negative for chest pain, palpitations and leg swelling.  Gastrointestinal: Negative for abdominal pain, constipation, diarrhea and heartburn.  Genitourinary: Negative for dysuria, frequency and urgency.  Musculoskeletal: Positive for back pain (when standing too long, hx of trauma ) and joint pain. Negative for falls and myalgias.  Skin: Negative.   Neurological: Negative for dizziness and headaches.  Psychiatric/Behavioral: Negative for depression and memory loss. The patient does not have insomnia.     Allergies as of 01/15/2019   No Known Allergies     Medication List       Accurate as of January 15, 2019  1:35 PM. If you have any questions, ask your nurse or doctor.        amLODipine 10 MG tablet Commonly known as: NORVASC Take 1 tablet (10 mg total) by mouth daily.   atorvastatin 20 MG tablet Commonly known as: LIPITOR Take 1 tablet (20 mg total) by mouth daily.   hydrochlorothiazide 25 MG tablet Commonly known as: HYDRODIURIL Take 1 tablet (25 mg total) by mouth daily.         Physical Exam: Vitals:   01/15/19 1318  BP: 140/88  Pulse: 87  Resp: 20  Temp: 98 F (36.7 C)  SpO2: 97%  Weight: (!) 393 lb 6.4 oz (178.4 kg)  Height: 6\' 3"  (1.905 m)   Body mass index is 49.17 kg/m. Wt Readings from Last 3 Encounters:  01/15/19 (!) 393 lb 6.4 oz (178.4 kg)  11/20/18 (!) 396 lb (179.6 kg)  11/01/18 (!) 385 lb (174.6 kg)    Physical  Exam Constitutional:      General: He is not in acute distress.    Appearance: He is well-developed. He is not diaphoretic.  HENT:     Head: Normocephalic and atraumatic.  Neck:     Musculoskeletal: Normal range of motion and neck supple.  Cardiovascular:     Rate and Rhythm: Normal rate and regular rhythm.     Pulses:          Dorsalis pedis pulses are 2+ on the right side and 2+ on the left side.       Posterior tibial pulses are 2+ on the right side and 2+ on the left side.     Heart sounds: Normal heart sounds.  Pulmonary:     Effort: Pulmonary effort is normal.     Breath sounds: Normal breath sounds.  Abdominal:     General: Bowel sounds are normal.     Palpations: Abdomen is soft.     Comments: obese  Musculoskeletal:  General: No tenderness.     Right lower leg: No edema.     Left lower leg: No edema.  Feet:     Right foot:     Protective Sensation: 3 sites tested. 3 sites sensed.     Skin integrity: Dry skin present.     Toenail Condition: Right toenails are abnormally thick. Fungal disease present.    Left foot:     Protective Sensation: 3 sites tested. 3 sites sensed.     Skin integrity: Dry skin present.     Toenail Condition: Left toenails are abnormally thick. Fungal disease present. Skin:    General: Skin is warm and dry.  Neurological:     Mental Status: He is alert and oriented to person, place, and time.  Psychiatric:        Mood and Affect: Mood normal.        Behavior: Behavior normal.     Labs reviewed: Basic Metabolic Panel: Recent Labs    05/21/18 1351 01/13/19 0845  NA 140 138  K 3.7 4.0  CL 103 101  CO2 29 28  GLUCOSE 98 153*  BUN 12 11  CREATININE 1.00 1.05  CALCIUM 9.3 9.5   Liver Function Tests: Recent Labs    05/21/18 1351 01/13/19 0845  AST 22 23  ALT 12 13  ALKPHOS 112  --   BILITOT 0.4 0.4  PROT 8.1 7.4  ALBUMIN 4.0  --    No results for input(s): LIPASE, AMYLASE in the last 8760 hours. No results for  input(s): AMMONIA in the last 8760 hours. CBC: Recent Labs    05/21/18 1351 01/13/19 0845  WBC 9.0 8.7  NEUTROABS 3.0 3,758  HGB 11.6* 11.7*  HCT 38.0* 38.4*  MCV 77.2* 77.6*  PLT 228 235   Lipid Panel: Recent Labs    01/13/19 0845  CHOL 123  HDL 40  LDLCALC 68  TRIG 66  CHOLHDL 3.1   Lab Results  Component Value Date   HGBA1C 8.4 (H) 01/13/2019    Procedures: No results found.  Assessment/Plan 1. Annual physical exam -  The patient was counseled regarding the appropriate use of alcohol,  prevention of dental and periodontal disease, diet, regular sustained exercise for at least 30 minutes 5 times per week,testicular self-examination on a monthly basis,smoking cessation, tobacco use,  and recommended schedule for GI hemoccult testing, colonoscopy, cholesterol, thyroid and diabetes screening. -he is up to date on colonoscopy -needs updated eye exam and dental visit -flu shot given today -discussed importance of weight control - EKG 12-Lead- NSR  2. Morbid obesity with BMI of 45.0-49.9, adult ([place]) -discussed importance of weight control with diet and exercise.   3. Anemia, unspecified type -unchanged, pt has been following with hematologist, no recent follow up.  4. Diabetes mellitus due to underlying condition with diabetic autonomic neuropathy, unspecified whether long term insulin use (HCC) -pt a1c 8.4 not at goal. He states has been working on diet but still eating foods that are not consistent with someone who has diabetes (increase sweets/sugars) -discussed starting medication (metformin) however he states he does not like to take medication so he would rather not.  Also discussed use of ACE/ARB for renal protection due to diabetes- again he declined starting medication -- discussed with the patient the pathophysiology of diabetes and the natural progression of the disease.  -stressed the importance of lifestyle changes including diet and exercise. -discussed  complications associated with diabetes including retinopathy, nephropathy, neuropathy as well as increased  risk of cardiovascular disease. We went over the benefit seen with glycemic control.  -he states he would talk it over with his wife and call us back to let us know if he would like to start medications.  - Hemoglobin A1c; Future - BASIC METABOLIC PANEL WITH GFR; Future - Microalbumin, urine; Future - Flu Vaccine QUAD High Dose(Fluad)  5. Essential hypertension -elevated today, did not take his medication, blood pressure did improve on recheck. Recommended compliance with medication- to take daily and encouraged dietary modifications- DASH diet given.  6. Mixed hyperlipidemia LDL 68 which is goal on lipitor, continue on Statin and diet modifications.   7. Need for hepatitis C screening test - Hepatitis C antibody; Future  8. Need for immunization against influenza - Flu Vaccine QUAD High Dose(Fluad)  9. Need for 23-polyvalent pneumococcal polysaccharide vaccine - Pneumococcal polysaccharide vaccine 23-valent greater than or equal to 2yo subcutaneous/IM  Next appt: 3 months with labs prior  Lessie Manigo K. [place], [facility] [phone]

## 2019-01-15 NOTE — Patient Instructions (Addendum)
Your diabetes is NOT controlled. Recommend to start medication and work on diet modifications and start exercise 30 mins 5 days a week  Recommended to start lisinopril 5 mg by mouth daily for renal protection- this helps kidneys due to diabetes  Recommended to START METFORMIN 500 mg by mouth daily for 1 week then increase to 500 mg by mouth twice daily with meals.   Recommend to make appt with your eye doctor for eye exam   DASH Eating Plan DASH stands for "Dietary Approaches to Stop Hypertension." The DASH eating plan is a healthy eating plan that has been shown to reduce high blood pressure (hypertension). It may also reduce your risk for type 2 diabetes, heart disease, and stroke. The DASH eating plan may also help with weight loss. What are tips for following this plan?  General guidelines  Avoid eating more than 2,300 mg (milligrams) of salt (sodium) a day. If you have hypertension, you may need to reduce your sodium intake to 1,500 mg a day.  Limit alcohol intake to no more than 1 drink a day for nonpregnant women and 2 drinks a day for men. One drink equals 12 oz of beer, 5 oz of wine, or 1 oz of hard liquor.  Work with your health care provider to maintain a healthy body weight or to lose weight. Ask what an ideal weight is for you.  Get at least 30 minutes of exercise that causes your heart to beat faster (aerobic exercise) most days of the week. Activities may include walking, swimming, or biking.  Work with your health care provider or diet and nutrition specialist (dietitian) to adjust your eating plan to your individual calorie needs. Reading food labels   Check food labels for the amount of sodium per serving. Choose foods with less than 5 percent of the Daily Value of sodium. Generally, foods with less than 300 mg of sodium per serving fit into this eating plan.  To find whole grains, look for the word "whole" as the first word in the ingredient list. Shopping  Buy  products labeled as "low-sodium" or "no salt added."  Buy fresh foods. Avoid canned foods and premade or frozen meals. Cooking  Avoid adding salt when cooking. Use salt-free seasonings or herbs instead of table salt or sea salt. Check with your health care provider or pharmacist before using salt substitutes.  Do not fry foods. Cook foods using healthy methods such as baking, boiling, grilling, and broiling instead.  Cook with heart-healthy oils, such as olive, canola, soybean, or sunflower oil. Meal planning  Eat a balanced diet that includes: ? 5 or more servings of fruits and vegetables each day. At each meal, try to fill half of your plate with fruits and vegetables. ? Up to 6-8 servings of whole grains each day. ? Less than 6 oz of lean meat, poultry, or fish each day. A 3-oz serving of meat is about the same size as a deck of cards. One egg equals 1 oz. ? 2 servings of low-fat dairy each day. ? A serving of nuts, seeds, or beans 5 times each week. ? Heart-healthy fats. Healthy fats called Omega-3 fatty acids are found in foods such as flaxseeds and coldwater fish, like sardines, salmon, and mackerel.  Limit how much you eat of the following: ? Canned or prepackaged foods. ? Food that is high in trans fat, such as fried foods. ? Food that is high in saturated fat, such as fatty meat. ? Sweets,  desserts, sugary drinks, and other foods with added sugar. ? Full-fat dairy products.  Do not salt foods before eating.  Try to eat at least 2 vegetarian meals each week.  Eat more home-cooked food and less restaurant, buffet, and fast food.  When eating at a restaurant, ask that your food be prepared with less salt or no salt, if possible. What foods are recommended? The items listed may not be a complete list. Talk with your dietitian about what dietary choices are best for you. Grains Whole-grain or whole-wheat bread. Whole-grain or whole-wheat pasta. Brown rice. [place].  Bulgur. Whole-grain and low-sodium cereals. Pita bread. Low-fat, low-sodium crackers. Whole-wheat flour tortillas. Vegetables Fresh or frozen vegetables (raw, steamed, roasted, or grilled). Low-sodium or reduced-sodium tomato and vegetable juice. Low-sodium or reduced-sodium tomato sauce and tomato paste. Low-sodium or reduced-sodium canned vegetables. Fruits All fresh, dried, or frozen fruit. Canned fruit in natural juice (without added sugar). Meat and other protein foods Skinless chicken or [place]. Ground chicken or [place]. Pork with fat trimmed off. Fish and seafood. Egg whites. Dried beans, peas, or lentils. Unsalted nuts, nut butters, and seeds. Unsalted canned beans. Lean cuts of beef with fat trimmed off. Low-sodium, lean deli meat. Dairy Low-fat (1%) or fat-free (skim) milk. Fat-free, low-fat, or reduced-fat cheeses. Nonfat, low-sodium ricotta or cottage cheese. Low-fat or nonfat yogurt. Low-fat, low-sodium cheese. Fats and oils Soft margarine without trans fats. Vegetable oil. Low-fat, reduced-fat, or light mayonnaise and salad dressings (reduced-sodium). Canola, safflower, olive, soybean, and sunflower oils. Avocado. Seasoning and other foods Herbs. Spices. Seasoning mixes without salt. Unsalted popcorn and pretzels. Fat-free sweets. What foods are not recommended? The items listed may not be a complete list. Talk with your dietitian about what dietary choices are best for you. Grains Baked goods made with fat, such as croissants, muffins, or some breads. Dry pasta or rice meal packs. Vegetables Creamed or fried vegetables. Vegetables in a cheese sauce. Regular canned vegetables (not low-sodium or reduced-sodium). Regular canned tomato sauce and paste (not low-sodium or reduced-sodium). Regular tomato and vegetable juice (not low-sodium or reduced-sodium). Angie Fava. Olives. Fruits Canned fruit in a light or heavy syrup. Fried fruit. Fruit in cream or butter sauce. Meat and other  protein foods Fatty cuts of meat. Ribs. Fried meat. Berniece Salines. Sausage. Bologna and other processed lunch meats. Salami. Fatback. Hotdogs. Bratwurst. Salted nuts and seeds. Canned beans with added salt. Canned or smoked fish. Whole eggs or egg yolks. Chicken or [place] with skin. Dairy Whole or 2% milk, cream, and half-and-half. Whole or full-fat cream cheese. Whole-fat or sweetened yogurt. Full-fat cheese. Nondairy creamers. Whipped toppings. Processed cheese and cheese spreads. Fats and oils Butter. Stick margarine. Lard. Shortening. Ghee. Bacon fat. Tropical oils, such as coconut, palm kernel, or palm oil. Seasoning and other foods Salted popcorn and pretzels. Onion salt, garlic salt, seasoned salt, table salt, and sea salt. Worcestershire sauce. Tartar sauce. Barbecue sauce. Teriyaki sauce. Soy sauce, including reduced-sodium. Steak sauce. Canned and packaged gravies. Fish sauce. Oyster sauce. Cocktail sauce. Horseradish that you find on the shelf. Ketchup. Mustard. Meat flavorings and tenderizers. Bouillon cubes. Hot sauce and Tabasco sauce. Premade or packaged marinades. Premade or packaged taco seasonings. Relishes. Regular salad dressings. Where to find more information:  National Heart, Lung, and [place]: https://wilson-eaton.com/  American Heart Association: www.heart.org Summary  The DASH eating plan is a healthy eating plan that has been shown to reduce high blood pressure (hypertension). It may also reduce your risk for type 2 diabetes, heart  disease, and stroke.  With the DASH eating plan, you should limit salt (sodium) intake to 2,300 mg a day. If you have hypertension, you may need to reduce your sodium intake to 1,500 mg a day.  When on the DASH eating plan, aim to eat more fresh fruits and vegetables, whole grains, lean proteins, low-fat dairy, and heart-healthy fats.  Work with your health care provider or diet and nutrition specialist (dietitian) to adjust your eating plan to your  individual calorie needs. This information is not intended to replace advice given to you by your health care provider. Make sure you discuss any questions you have with your health care provider. Document Released: 03/09/2011 Document Revised: 03/02/2017 Document Reviewed: 03/13/2016 Elsevier Patient Education  2020 [place] Maintenance, Male Adopting a healthy lifestyle and getting preventive care are important in promoting health and wellness. Ask your health care provider about:  The right schedule for you to have regular tests and exams.  Things you can do on your own to prevent diseases and keep yourself healthy. What should I know about diet, weight, and exercise? Eat a healthy diet   Eat a diet that includes plenty of vegetables, fruits, low-fat dairy products, and lean protein.  Do not eat a lot of foods that are high in solid fats, added sugars, or sodium. Maintain a healthy weight Body mass index (BMI) is a measurement that can be used to identify possible weight problems. It estimates body fat based on height and weight. Your health care provider can help determine your BMI and help you achieve or maintain a healthy weight. Get regular exercise Get regular exercise. This is one of the most important things you can do for your health. Most adults should:  Exercise for at least 150 minutes each week. The exercise should increase your heart rate and make you sweat (moderate-intensity exercise).  Do strengthening exercises at least twice a week. This is in addition to the moderate-intensity exercise.  Spend less time sitting. Even light physical activity can be beneficial. Watch cholesterol and blood lipids Have your blood tested for lipids and cholesterol at 62 years of age, then have this test every 5 years. You may need to have your cholesterol levels checked more often if:  Your lipid or cholesterol levels are high.  You are older than 62 years of  age.  You are at high risk for heart disease. What should I know about cancer screening? Many types of cancers can be detected early and may often be prevented. Depending on your health history and family history, you may need to have cancer screening at various ages. This may include screening for:  Colorectal cancer.  Prostate cancer.  Skin cancer.  Lung cancer. What should I know about heart disease, diabetes, and high blood pressure? Blood pressure and heart disease  High blood pressure causes heart disease and increases the risk of stroke. This is more likely to develop in people who have high blood pressure readings, are of African descent, or are overweight.  Talk with your health care provider about your target blood pressure readings.  Have your blood pressure checked: ? Every 3-5 years if you are 50-30 years of age. ? Every year if you are 60 years old or older.  If you are between the ages of 86 and 78 and are a current or former smoker, ask your health care provider if you should have a one-time screening for abdominal aortic aneurysm (AAA). Diabetes  Have regular diabetes screenings. This checks your fasting blood sugar level. Have the screening done:  Once every three years after age 73 if you are at a normal weight and have a low risk for diabetes.  More often and at a younger age if you are overweight or have a high risk for diabetes. What should I know about preventing infection? Hepatitis B If you have a higher risk for hepatitis B, you should be screened for this virus. Talk with your health care provider to find out if you are at risk for hepatitis B infection. Hepatitis C Blood testing is recommended for:  Everyone born from [birth year] through 1964.  Anyone with known risk factors for hepatitis C. Sexually transmitted infections (STIs)  You should be screened each year for STIs, including gonorrhea and chlamydia, if: ? You are sexually active and are younger  than 62 years of age. ? You are older than 62 years of age and your health care provider tells you that you are at risk for this type of infection. ? Your sexual activity has changed since you were last screened, and you are at increased risk for chlamydia or gonorrhea. Ask your health care provider if you are at risk.  Ask your health care provider about whether you are at high risk for HIV. Your health care provider may recommend a prescription medicine to help prevent HIV infection. If you choose to take medicine to prevent HIV, you should first get tested for HIV. You should then be tested every 3 months for as long as you are taking the medicine. Follow these instructions at home: Lifestyle  Do not use any products that contain nicotine or tobacco, such as cigarettes, e-cigarettes, and chewing tobacco. If you need help quitting, ask your health care provider.  Do not use street drugs.  Do not share needles.  Ask your health care provider for help if you need support or information about quitting drugs. Alcohol use  Do not drink alcohol if your health care provider tells you not to drink.  If you drink alcohol: ? Limit how much you have to 0-2 drinks a day. ? Be aware of how much alcohol is in your drink. In the [place] one drink equals one 12 oz bottle of beer (355 mL), one 5 oz glass of wine (148 mL), or one 1 oz glass of hard liquor (44 mL). General instructions  Schedule regular health, dental, and eye exams.  Stay current with your vaccines.  Tell your health care provider if: ? You often feel depressed. ? You have ever been abused or do not feel safe at home. Summary  Adopting a healthy lifestyle and getting preventive care are important in promoting health and wellness.  Follow your health care provider's instructions about healthy diet, exercising, and getting tested or screened for diseases.  Follow your health care provider's instructions on monitoring your  cholesterol and blood pressure. This information is not intended to replace advice given to you by your health care provider. Make sure you discuss any questions you have with your health care provider. Document Released: 09/16/2007 Document Revised: 03/13/2018 Document Reviewed: 03/13/2018 Elsevier Patient Education  2020 [employer].

## 2019-02-24 ENCOUNTER — Other Ambulatory Visit: Payer: Self-pay

## 2019-02-24 DIAGNOSIS — E0843 Diabetes mellitus due to underlying condition with diabetic autonomic (poly)neuropathy: Secondary | ICD-10-CM | POA: Diagnosis not present

## 2019-02-25 LAB — MICROALBUMIN, URINE: Microalb, Ur: 1.4 mg/dL

## 2019-03-04 DIAGNOSIS — Z20828 Contact with and (suspected) exposure to other viral communicable diseases: Secondary | ICD-10-CM | POA: Diagnosis not present

## 2019-04-15 ENCOUNTER — Other Ambulatory Visit: Payer: BC Managed Care – PPO

## 2019-04-15 ENCOUNTER — Other Ambulatory Visit: Payer: Self-pay

## 2019-04-15 DIAGNOSIS — Z1159 Encounter for screening for other viral diseases: Secondary | ICD-10-CM | POA: Diagnosis not present

## 2019-04-15 DIAGNOSIS — E0843 Diabetes mellitus due to underlying condition with diabetic autonomic (poly)neuropathy: Secondary | ICD-10-CM | POA: Diagnosis not present

## 2019-04-16 LAB — BASIC METABOLIC PANEL WITH GFR
BUN: 13 mg/dL (ref 7–25)
CO2: 25 mmol/L (ref 20–32)
Calcium: 9.5 mg/dL (ref 8.6–10.3)
Chloride: 101 mmol/L (ref 98–110)
Creat: 1.15 mg/dL (ref 0.70–1.25)
GFR, Est African American: 79 mL/min/{1.73_m2} (ref >=60)
GFR, Est Non African American: 68 mL/min/{1.73_m2} (ref >=60)
Glucose, Bld: 204 mg/dL — ABNORMAL HIGH (ref 65–99)
Potassium: 4.1 mmol/L (ref 3.5–5.3)
Sodium: 136 mmol/L (ref 135–146)

## 2019-04-16 LAB — HEMOGLOBIN A1C
Hgb A1c MFr Bld: 10.2 % of total Hgb — ABNORMAL HIGH (ref <5.7)
Mean Plasma Glucose: 246 (calc)
eAG (mmol/L): 13.6 (calc)

## 2019-04-16 LAB — HEPATITIS C ANTIBODY
Hepatitis C Ab: NONREACTIVE
SIGNAL TO CUT-OFF: 0.03 (ref <1.00)

## 2019-04-18 ENCOUNTER — Ambulatory Visit (INDEPENDENT_AMBULATORY_CARE_PROVIDER_SITE_OTHER): Payer: BC Managed Care – PPO | Admitting: Nurse Practitioner

## 2019-04-18 ENCOUNTER — Other Ambulatory Visit: Payer: Self-pay

## 2019-04-18 ENCOUNTER — Encounter: Payer: Self-pay | Admitting: Nurse Practitioner

## 2019-04-18 VITALS — BP 144/86 | HR 100 | Temp 97.5°F | Ht 75.0 in | Wt 388.0 lb

## 2019-04-18 DIAGNOSIS — E782 Mixed hyperlipidemia: Secondary | ICD-10-CM

## 2019-04-18 DIAGNOSIS — I10 Essential (primary) hypertension: Secondary | ICD-10-CM

## 2019-04-18 DIAGNOSIS — E0843 Diabetes mellitus due to underlying condition with diabetic autonomic (poly)neuropathy: Secondary | ICD-10-CM | POA: Diagnosis not present

## 2019-04-18 DIAGNOSIS — B354 Tinea corporis: Secondary | ICD-10-CM

## 2019-04-18 DIAGNOSIS — Z6841 Body Mass Index (BMI) 40.0 and over, adult: Secondary | ICD-10-CM

## 2019-04-18 MED ORDER — CLOTRIMAZOLE 1 % EX CREA: 1.0000 "application " | TOPICAL_CREAM | Freq: Two times a day (BID) | CUTANEOUS | 0 refills | Status: DC

## 2019-04-18 MED ORDER — METFORMIN HCL 1000 MG PO TABS: 1000.0000 mg | ORAL_TABLET | Freq: Two times a day (BID) | ORAL | 3 refills | Status: DC

## 2019-04-18 MED ORDER — LISINOPRIL 10 MG PO TABS: 10.0000 mg | ORAL_TABLET | Freq: Every day | ORAL | 3 refills | Status: DC

## 2019-04-18 NOTE — Patient Instructions (Addendum)
To start metformin 1000 mg 1/2 tablet daily for 1 week then increase to 1/2 tablet twice daily for 1 week then increase to whole tablet in the morning and 1/2 tablet in the evening for 1 week then increase to whole tablet twice daily  To start lisinopril for blood pressure control and renal protection   To check fasting blood sugar and bring back to visit in 4 weeks.   clotrimazole (LOTRIMIN) 1 % cream sent to pharmacy to be applied twice daily to skin

## 2019-04-18 NOTE — Progress Notes (Signed)
Careteam: Patient Care Team: Lauree Chandler, NP as PCP - General (Geriatric Medicine) Carol Ada, MD as Consulting Physician (Gastroenterology) Brunetta Genera, MD as Consulting Physician (Hematology) Elsie Saas, MD as Consulting Physician (Orthopedic Surgery)  Advanced Directive information Does Patient Have a Medical Advance Directive?: No, Would patient like information on creating a medical advance directive?: No - Patient declined  No Known Allergies  Chief Complaint  Patient presents with  . Medical Management of Chronic Issues    3 month follow-up and discuss labs (copy printed)   . Quality Metric Gaps    Discuss need for eye exam      HPI: Patient is a 64 y.o. male for routine follow up.   Did not want to start medication for diabetes at last visit.  A1c. Did not make dietary changes. A1c now worse.  Does not feel like he needs to get an eye exam due to the fact he is already "legally blind" Denies numbness or tingling to LE.   Reports he is out of his HCTZ- thinks someone threw it away at work- has prescriptions available at pharmacy.   Has a bike that he rides- only rode 2 times this week.  Felt like he could control blood sugars with lifestyle modifications but has not made any at this time.    Review of Systems:  Review of Systems  Constitutional: Negative for chills, fever and weight loss.  Respiratory: Negative for cough, sputum production and shortness of breath.   Cardiovascular: Positive for chest pain (chronic, without change). Negative for palpitations and leg swelling.  Gastrointestinal: Negative for abdominal pain, constipation, diarrhea and heartburn.  Genitourinary: Negative for dysuria, frequency and urgency.  Musculoskeletal: Positive for joint pain (knees, shoulder). Negative for back pain and myalgias.  Skin: Negative.   Neurological: Negative for dizziness and headaches.  Psychiatric/Behavioral: Negative for depression and  memory loss. The patient does not have insomnia.     Past Medical History:  Diagnosis Date  . Diabetes mellitus without complication ([place])   . Epilepsy ([place])   . Hypertension   . Type 2 diabetes mellitus ([place])    Past Surgical History:  Procedure Laterality Date  . COLONOSCOPY WITH PROPOFOL N/A 11/01/2018   Procedure: COLONOSCOPY WITH PROPOFOL;  Surgeon: Carol Ada, MD;  Location: [facility];  Service: Endoscopy;  Laterality: N/A;  . KNEE ARTHROSCOPY Right 2005   Dr Shirlean Kelly  . POLYPECTOMY  11/01/2018   Procedure: POLYPECTOMY;  Surgeon: Carol Ada, MD;  Location: [facility];  Service: Endoscopy;;   Social History:   reports that he quit smoking about 14 years ago. He has never used smokeless tobacco. He reports previous alcohol use. He reports that he does not use drugs.  No family history on file.  Medications: Patient's Medications  New Prescriptions   No medications on file  Previous Medications   AMLODIPINE (NORVASC) 10 MG TABLET    Take 1 tablet (10 mg total) by mouth daily.   ATORVASTATIN (LIPITOR) 20 MG TABLET    Take 1 tablet (20 mg total) by mouth daily.   HYDROCHLOROTHIAZIDE (HYDRODIURIL) 25 MG TABLET    Take 1 tablet (25 mg total) by mouth daily.  Modified Medications   No medications on file  Discontinued Medications   No medications on file    Physical Exam:  Vitals:   04/18/19 1306  BP: (!) 144/86  Pulse: 100  Temp: (!) 97.5 F (36.4 C)  TempSrc: Temporal  SpO2: 97%  Weight: Marland Kitchen)  388 lb (176 kg)  Height: 6\' 3"  (1.905 m)   Body mass index is 48.5 kg/m. Wt Readings from Last 3 Encounters:  04/18/19 (!) 388 lb (176 kg)  01/15/19 (!) 393 lb 6.4 oz (178.4 kg)  11/20/18 (!) 396 lb (179.6 kg)    Physical Exam Constitutional:      Appearance: Normal appearance.  HENT:     Head: Normocephalic and atraumatic.  Cardiovascular:     Rate and Rhythm: Normal rate and regular rhythm.     Pulses: Normal pulses.     Heart sounds: Normal heart  sounds.  Pulmonary:     Effort: Pulmonary effort is normal.  Abdominal:     General: Abdomen is protuberant. Bowel sounds are normal.     Tenderness: There is no abdominal tenderness.  Musculoskeletal:     Cervical back: Normal range of motion and neck supple.     Right lower leg: No edema.     Left lower leg: No edema.  Skin:    General: Skin is warm and dry.     Findings: Rash (patchy raised area to medial lower leg) present.  Neurological:     Mental Status: He is alert and oriented to person, place, and time. Mental status is at baseline.  Psychiatric:        Mood and Affect: Affect is blunt.        Speech: Speech normal.        Behavior: Behavior is withdrawn.     Labs reviewed: Basic Metabolic Panel: Recent Labs    05/21/18 1351 01/13/19 0845 04/15/19 0805  NA 140 138 136  K 3.7 4.0 4.1  CL 103 101 101  CO2 29 28 25   GLUCOSE 98 153* 204*  BUN 12 11 13   CREATININE 1.00 1.05 1.15  CALCIUM 9.3 9.5 9.5   Liver Function Tests: Recent Labs    05/21/18 1351 01/13/19 0845  AST 22 23  ALT 12 13  ALKPHOS 112  --   BILITOT 0.4 0.4  PROT 8.1 7.4  ALBUMIN 4.0  --    No results for input(s): LIPASE, AMYLASE in the last 8760 hours. No results for input(s): AMMONIA in the last 8760 hours. CBC: Recent Labs    05/21/18 1351 01/13/19 0845  WBC 9.0 8.7  NEUTROABS 3.0 3,758  HGB 11.6* 11.7*  HCT 38.0* 38.4*  MCV 77.2* 77.6*  PLT 228 235   Lipid Panel: Recent Labs    01/13/19 0845  CHOL 123  HDL 40  LDLCALC 68  TRIG 66  CHOLHDL 3.1   TSH: No results for input(s): TSH in the last 8760 hours. A1C: Lab Results  Component Value Date   HGBA1C 10.2 (H) 04/15/2019     Assessment/Plan 1. Diabetes mellitus due to underlying condition with diabetic autonomic neuropathy, unspecified whether long term insulin use ([place]) -against starting medication to manage his diabetes at last visit and a1c has worsened.  - discussed in detail again with the patient the  pathophysiology of diabetes and the natural progression of the disease.  He was agreeable to start medication at this time but also wanted to work on diet and exercise.  Ideally with an a1C of 10.2 he would need insulin but he declined at this time and stated he would take metformin and we can evaluate in 4 weeks.  -titration given for metformin to get up to 1000 mg BID -stressed the importance of lifestyle changes including diet and exercise. -discussed complications associated with diabetes including  retinopathy, nephropathy, neuropathy as well as increased risk of cardiovascular disease. We went over the benefit seen with glycemic control.  - lisinopril (ZESTRIL) 10 MG tablet; Take 1 tablet (10 mg total) by mouth daily.  Dispense: 90 tablet; Refill: 3 - metFORMIN (GLUCOPHAGE) 1000 MG tablet; Take 1 tablet (1,000 mg total) by mouth 2 (two) times daily with a meal.  Dispense: 180 tablet; Refill: 3  2. Morbid obesity with BMI of 45.0-49.9, adult ([place]) -weight loss recommended and discussed with pt by increasing physical activity and dietary modifications.   3. Essential hypertension -not controlled. Elevated at last visit as well. ACEi indicated due to diabetes, will add on at this timem - lisinopril (ZESTRIL) 10 MG tablet; Take 1 tablet (10 mg total) by mouth daily.  Dispense: 90 tablet; Refill: 3  4. Mixed hyperlipidemia -LDL 68 which is goal on lipitor 20 mg daily, again recommended to increase physical activity and make dietary changes.   5. Tinea corporis - clotrimazole (LOTRIMIN) 1 % cream; Apply 1 application topically 2 (two) times daily.  Dispense: 30 g; Refill: 0   Next appt: 4 weeks with blood sugar readings.  Carlos American. [place], [facility] [phone]

## 2019-04-29 ENCOUNTER — Other Ambulatory Visit: Payer: Self-pay | Admitting: *Deleted

## 2019-04-29 DIAGNOSIS — B354 Tinea corporis: Secondary | ICD-10-CM

## 2019-04-29 MED ORDER — CLOTRIMAZOLE 1 % EX CREA: 1.0000 "application " | TOPICAL_CREAM | Freq: Two times a day (BID) | CUTANEOUS | 0 refills | Status: DC

## 2019-04-29 NOTE — Telephone Encounter (Signed)
Patient stated that pharmacy did not receive the cream Rx. Refaxed.

## 2019-05-16 ENCOUNTER — Ambulatory Visit: Payer: BC Managed Care – PPO | Admitting: Nurse Practitioner

## 2019-05-19 ENCOUNTER — Encounter: Payer: Self-pay | Admitting: Nurse Practitioner

## 2019-05-19 ENCOUNTER — Other Ambulatory Visit: Payer: Self-pay

## 2019-05-19 ENCOUNTER — Ambulatory Visit (INDEPENDENT_AMBULATORY_CARE_PROVIDER_SITE_OTHER): Payer: BC Managed Care – PPO | Admitting: Nurse Practitioner

## 2019-05-19 VITALS — BP 124/68 | HR 80 | Temp 97.3°F | Ht 75.0 in | Wt 376.4 lb

## 2019-05-19 DIAGNOSIS — E0843 Diabetes mellitus due to underlying condition with diabetic autonomic (poly)neuropathy: Secondary | ICD-10-CM | POA: Diagnosis not present

## 2019-05-19 DIAGNOSIS — I10 Essential (primary) hypertension: Secondary | ICD-10-CM | POA: Diagnosis not present

## 2019-05-19 MED ORDER — JARDIANCE 10 MG PO TABS: 10.0000 mg | ORAL_TABLET | Freq: Every day | ORAL | 3 refills | Status: DC

## 2019-05-19 NOTE — Patient Instructions (Addendum)
To continue metformin 1000 mg by mouth twice daily  To start (JARDIANCE) 10 MG TABS tablet; Take 10 mg by mouth daily before breakfast for diabetes  Continue on dietary modifications with increase in activity  Continue to check blood sugar daily before breakfast or if you feel like you are having a low sugar episode.

## 2019-05-19 NOTE — Progress Notes (Signed)
Careteam: Patient Care Team: Lauree Chandler, NP as PCP - General (Geriatric Medicine) Carol Ada, MD as Consulting Physician (Gastroenterology) Brunetta Genera, MD as Consulting Physician (Hematology) Elsie Saas, MD as Consulting Physician (Orthopedic Surgery)  Advanced Directive information    No Known Allergies  Chief Complaint  Patient presents with  . Follow-up    4 week follow-up on blood sugar      HPI: Patient is a 64 y.o. male for blood sugar review. Blood sugars have gone from 180-200 fasting to 130-150s.  He is now taking metformin 1000 mg twice daily breakfast and supper. Walking more and exercising more. Has lost weight.  No low blood sugars.  Continues to work on diet.  No numbness or tingling to LE.  No increase in urination.   Does not want to take an injection for blood sugar control.   Review of Systems:  Review of Systems  Constitutional: Negative for chills, fever and weight loss.  HENT: Negative for tinnitus.   Respiratory: Negative for cough, sputum production and shortness of breath.   Cardiovascular: Negative for chest pain, palpitations and leg swelling.  Gastrointestinal: Negative for abdominal pain, constipation, diarrhea and heartburn.  Genitourinary: Negative for dysuria, frequency and urgency.  Musculoskeletal: Negative for back pain, falls, joint pain and myalgias.  Skin: Negative.   Neurological: Negative for dizziness and headaches.  Psychiatric/Behavioral: Negative for depression and memory loss. The patient does not have insomnia.     Past Medical History:  Diagnosis Date  . Diabetes mellitus without complication ([place])   . Epilepsy ([place])   . Hypertension   . Type 2 diabetes mellitus ([place])    Past Surgical History:  Procedure Laterality Date  . COLONOSCOPY WITH PROPOFOL N/A 11/01/2018   Procedure: COLONOSCOPY WITH PROPOFOL;  Surgeon: Carol Ada, MD;  Location: [facility];  Service: Endoscopy;  Laterality:  N/A;  . KNEE ARTHROSCOPY Right 2005   Dr Shirlean Kelly  . POLYPECTOMY  11/01/2018   Procedure: POLYPECTOMY;  Surgeon: Carol Ada, MD;  Location[facility];  Service: Endoscopy;;   Social History:   reports that he quit smoking about 14 years ago. He has never used smokeless tobacco. He reports previous alcohol use. He reports that he does not use drugs.  History reviewed. No pertinent family history.  Medications: Patient's Medications  New Prescriptions   No medications on file  Previous Medications   AMLODIPINE (NORVASC) 10 MG TABLET    Take 1 tablet (10 mg total) by mouth daily.   ATORVASTATIN (LIPITOR) 20 MG TABLET    Take 1 tablet (20 mg total) by mouth daily.   CLOTRIMAZOLE (LOTRIMIN) 1 % CREAM    Apply 1 application topically 2 (two) times daily.   HYDROCHLOROTHIAZIDE (HYDRODIURIL) 25 MG TABLET    Take 1 tablet (25 mg total) by mouth daily.   LISINOPRIL (ZESTRIL) 10 MG TABLET    Take 1 tablet (10 mg total) by mouth daily.   METFORMIN (GLUCOPHAGE) 1000 MG TABLET    Take 1 tablet (1,000 mg total) by mouth 2 (two) times daily with a meal.  Modified Medications   No medications on file  Discontinued Medications   No medications on file    Physical Exam:  Vitals:   05/19/19 1542  BP: 124/68  Pulse: 80  Temp: (!) 97.3 F (36.3 C)  TempSrc: Temporal  SpO2: 95%  Weight: (!) 376 lb 6.4 oz (170.7 kg)  Height: 6\' 3"  (1.905 m)   Body mass index is 47.05  kg/m. Wt Readings from Last 3 Encounters:  05/19/19 (!) 376 lb 6.4 oz (170.7 kg)  04/18/19 (!) 388 lb (176 kg)  01/15/19 (!) 393 lb 6.4 oz (178.4 kg)    Physical Exam Constitutional:      Appearance: Normal appearance.  Cardiovascular:     Rate and Rhythm: Normal rate and regular rhythm.  Pulmonary:     Effort: Pulmonary effort is normal.     Breath sounds: Normal breath sounds.  Abdominal:     General: Abdomen is protuberant. Bowel sounds are normal.  Musculoskeletal:        General: Normal range of motion.      Right lower leg: No edema.     Left lower leg: No edema.  Skin:    General: Skin is warm and dry.  Neurological:     Mental Status: He is alert and oriented to person, place, and time.  Psychiatric:        Mood and Affect: Affect is blunt.     Labs reviewed: Basic Metabolic Panel: Recent Labs    05/21/18 1351 01/13/19 0845 04/15/19 0805  NA 140 138 136  K 3.7 4.0 4.1  CL 103 101 101  CO2 29 28 25   GLUCOSE 98 153* 204*  BUN 12 11 13   CREATININE 1.00 1.05 1.15  CALCIUM 9.3 9.5 9.5   Liver Function Tests: Recent Labs    05/21/18 1351 01/13/19 0845  AST 22 23  ALT 12 13  ALKPHOS 112  --   BILITOT 0.4 0.4  PROT 8.1 7.4  ALBUMIN 4.0  --    No results for input(s): LIPASE, AMYLASE in the last 8760 hours. No results for input(s): AMMONIA in the last 8760 hours. CBC: Recent Labs    05/21/18 1351 01/13/19 0845  WBC 9.0 8.7  NEUTROABS 3.0 3,758  HGB 11.6* 11.7*  HCT 38.0* 38.4*  MCV 77.2* 77.6*  PLT 228 235   Lipid Panel: Recent Labs    01/13/19 0845  CHOL 123  HDL 40  LDLCALC 68  TRIG 66  CHOLHDL 3.1   TSH: No results for input(s): TSH in the last 8760 hours. A1C: Lab Results  Component Value Date   HGBA1C 10.2 (H) 04/15/2019     Assessment/Plan 1. Diabetes mellitus due to underlying condition with diabetic autonomic neuropathy, without long-term current use of insulin (HCC) -blood sugars have trended down but still overall high and above goal. He is completely against any injectable (insulin, trulicity)  -he plans to continue to work on diet and increasing physical activity. Agreeable to start jardiance 10 mg daily.  -- discussed with the patient the pathophysiology of diabetes and the natural progression of the disease.  -stressed the importance of lifestyle changes including diet and exercise. -discussed complications associated with diabetes including retinopathy, nephropathy, neuropathy as well as increased risk of cardiovascular disease. We  went over the benefit seen with glycemic control.  - empagliflozin (JARDIANCE) 10 MG TABS tablet; Take 10 mg by mouth daily before breakfast.  Dispense: 30 tablet; Refill: 3 -samples given. Will have him continue to take fasting blood sugar and follow up A1c in 2 months.   2. Essential hypertension Improved with recent medication. Continue diet medication with lisinopril, amlodipine and hctz  3. Morbid obesity ([place]) -ongoing, has lost weight since last visit. He plans to continue to work on diet and exercise.   Next appt: 2 months, labs prior  Marleah Beever K. [place], [facility] [phone]

## 2019-05-20 ENCOUNTER — Ambulatory Visit: Payer: BC Managed Care – PPO | Attending: Internal Medicine

## 2019-05-20 DIAGNOSIS — Z23 Encounter for immunization: Secondary | ICD-10-CM | POA: Insufficient documentation

## 2019-05-20 DIAGNOSIS — Z20828 Contact with and (suspected) exposure to other viral communicable diseases: Secondary | ICD-10-CM | POA: Diagnosis not present

## 2019-05-20 NOTE — Progress Notes (Signed)
   Covid-19 Vaccination Clinic  Name:  Lamier Rummel    MRN: [id number] DOB: 1955-05-25  05/20/2019  Mr. Guyot was observed post Covid-19 immunization for 15 minutes without incidence. He was provided with Vaccine Information Sheet and instruction to access the V-Safe system.   Mr. Kean was instructed to call 911 with any severe reactions post vaccine: Marland Kitchen Difficulty breathing  . Swelling of your face and throat  . A fast heartbeat  . A bad rash all over your body  . Dizziness and weakness    Immunizations Administered    Name Date Dose VIS Date Route   Moderna COVID-19 Vaccine 05/20/2019  2:11 PM 0.5 mL 03/04/2019 Intramuscular   Manufacturer: Moderna   Lot: [id number]   [id number]

## 2019-05-29 ENCOUNTER — Encounter: Payer: Self-pay | Admitting: Nurse Practitioner

## 2019-06-05 DIAGNOSIS — Z20828 Contact with and (suspected) exposure to other viral communicable diseases: Secondary | ICD-10-CM | POA: Diagnosis not present

## 2019-06-13 DIAGNOSIS — Z20828 Contact with and (suspected) exposure to other viral communicable diseases: Secondary | ICD-10-CM | POA: Diagnosis not present

## 2019-06-18 ENCOUNTER — Ambulatory Visit: Payer: BC Managed Care – PPO | Attending: Internal Medicine

## 2019-06-18 DIAGNOSIS — Z23 Encounter for immunization: Secondary | ICD-10-CM

## 2019-06-18 NOTE — Progress Notes (Signed)
   Covid-19 Vaccination Clinic  Name:  Michael [name]    MRN: [id number] DOB: 03-09-1956  06/18/2019  Michael [name] was observed post Covid-19 immunization for 15 minutes without incident. He was provided with Vaccine Information Sheet and instruction to access the V-Safe system.   Michael [name] was instructed to call 911 with any severe reactions post vaccine: Marland Kitchen Difficulty breathing  . Swelling of face and throat  . A fast heartbeat  . A bad rash all over body  . Dizziness and weakness   Immunizations Administered    Name Date Dose VIS Date Route   Moderna COVID-19 Vaccine 06/18/2019 12:23 PM 0.5 mL 03/04/2019 Intramuscular   Manufacturer: Moderna   Lot: [id number]   [id number]

## 2019-06-19 ENCOUNTER — Telehealth: Payer: Self-pay

## 2019-06-19 NOTE — Telephone Encounter (Signed)
Incoming call received from patient requesting refill on Jardiance. Patient states he was given samples and have now ran out and needs rx sent to local pharmacy.  I informed patient rx was sent on 05/19/2019 to [facility] on [address] and all he needs to do is call and tell them to get rx ready for pick up. RX's are usually held on file x 6 months after written date. Patient verbalized understanding.

## 2019-07-18 ENCOUNTER — Other Ambulatory Visit: Payer: Self-pay

## 2019-07-18 ENCOUNTER — Other Ambulatory Visit: Payer: BC Managed Care – PPO

## 2019-07-18 DIAGNOSIS — E0843 Diabetes mellitus due to underlying condition with diabetic autonomic (poly)neuropathy: Secondary | ICD-10-CM | POA: Diagnosis not present

## 2019-07-18 DIAGNOSIS — I10 Essential (primary) hypertension: Secondary | ICD-10-CM

## 2019-07-19 LAB — COMPLETE METABOLIC PANEL WITH GFR
AG Ratio: 1.3 (calc) (ref 1.0–2.5)
ALT: 9 U/L (ref 9–46)
AST: 16 U/L (ref 10–35)
Albumin: 4.1 g/dL (ref 3.6–5.1)
Alkaline phosphatase (APISO): 107 U/L (ref 35–144)
BUN: 11 mg/dL (ref 7–25)
CO2: 26 mmol/L (ref 20–32)
Calcium: 9.3 mg/dL (ref 8.6–10.3)
Chloride: 102 mmol/L (ref 98–110)
Creat: 0.96 mg/dL (ref 0.70–1.25)
GFR, Est African American: 98 mL/min/{1.73_m2} (ref >=60)
GFR, Est Non African American: 84 mL/min/{1.73_m2} (ref >=60)
Globulin: 3.1 g/dL (calc) (ref 1.9–3.7)
Glucose, Bld: 112 mg/dL — ABNORMAL HIGH (ref 65–99)
Potassium: 4.1 mmol/L (ref 3.5–5.3)
Sodium: 138 mmol/L (ref 135–146)
Total Bilirubin: 0.4 mg/dL (ref 0.2–1.2)
Total Protein: 7.2 g/dL (ref 6.1–8.1)

## 2019-07-19 LAB — HEMOGLOBIN A1C
Hgb A1c MFr Bld: 7.4 % of total Hgb — ABNORMAL HIGH (ref <5.7)
Mean Plasma Glucose: 166 (calc)
eAG (mmol/L): 9.2 (calc)

## 2019-07-21 ENCOUNTER — Encounter: Payer: Self-pay | Admitting: Nurse Practitioner

## 2019-07-21 ENCOUNTER — Other Ambulatory Visit: Payer: Self-pay

## 2019-07-21 ENCOUNTER — Ambulatory Visit (INDEPENDENT_AMBULATORY_CARE_PROVIDER_SITE_OTHER): Payer: BC Managed Care – PPO | Admitting: Nurse Practitioner

## 2019-07-21 DIAGNOSIS — I10 Essential (primary) hypertension: Secondary | ICD-10-CM | POA: Diagnosis not present

## 2019-07-21 DIAGNOSIS — B354 Tinea corporis: Secondary | ICD-10-CM | POA: Diagnosis not present

## 2019-07-21 DIAGNOSIS — E0843 Diabetes mellitus due to underlying condition with diabetic autonomic (poly)neuropathy: Secondary | ICD-10-CM

## 2019-07-21 NOTE — Patient Instructions (Signed)
Continue to work on changing diet- diabetic diet  Increase physical activity. 30 mins 5 days a week is goal    Diabetes Mellitus and Nutrition, Adult When you have diabetes (diabetes mellitus), it is very important to have healthy eating habits because your blood sugar (glucose) levels are greatly affected by what you eat and drink. Eating healthy foods in the appropriate amounts, at about the same times every day, can help you:  Control your blood glucose.  Lower your risk of heart disease.  Improve your blood pressure.  Reach or maintain a healthy weight. Every person with diabetes is different, and each person has different needs for a meal plan. Your health care provider may recommend that you work with a diet and nutrition specialist (dietitian) to make a meal plan that is best for you. Your meal plan may vary depending on factors such as:  The calories you need.  The medicines you take.  Your weight.  Your blood glucose, blood pressure, and cholesterol levels.  Your activity level.  Other health conditions you have, such as heart or kidney disease. How do carbohydrates affect me? Carbohydrates, also called carbs, affect your blood glucose level more than any other type of food. Eating carbs naturally raises the amount of glucose in your blood. Carb counting is a method for keeping track of how many carbs you eat. Counting carbs is important to keep your blood glucose at a healthy level, especially if you use insulin or take certain oral diabetes medicines. It is important to know how many carbs you can safely have in each meal. This is different for every person. Your dietitian can help you calculate how many carbs you should have at each meal and for each snack. Foods that contain carbs include:  Bread, cereal, rice, pasta, and crackers.  Potatoes and corn.  Peas, beans, and lentils.  Milk and yogurt.  Fruit and juice.  Desserts, such as cakes, cookies, ice cream,  and candy. How does alcohol affect me? Alcohol can cause a sudden decrease in blood glucose (hypoglycemia), especially if you use insulin or take certain oral diabetes medicines. Hypoglycemia can be a life-threatening condition. Symptoms of hypoglycemia (sleepiness, dizziness, and confusion) are similar to symptoms of having too much alcohol. If your health care provider says that alcohol is safe for you, follow these guidelines:  Limit alcohol intake to no more than 1 drink per day for nonpregnant women and 2 drinks per day for men. One drink equals 12 oz of beer, 5 oz of wine, or 1 oz of hard liquor.  Do not drink on an empty stomach.  Keep yourself hydrated with water, diet soda, or unsweetened iced tea.  Keep in mind that regular soda, juice, and other mixers may contain a lot of sugar and must be counted as carbs. What are tips for following this plan?  Reading food labels  Start by checking the serving size on the "Nutrition Facts" label of packaged foods and drinks. The amount of calories, carbs, fats, and other nutrients listed on the label is based on one serving of the item. Many items contain more than one serving per package.  Check the total grams (g) of carbs in one serving. You can calculate the number of servings of carbs in one serving by dividing the total carbs by 15. For example, if a food has 30 g of total carbs, it would be equal to 2 servings of carbs.  Check the number of grams (g) of  saturated and trans fats in one serving. Choose foods that have low or no amount of these fats.  Check the number of milligrams (mg) of salt (sodium) in one serving. Most people should limit total sodium intake to less than 2,300 mg per day.  Always check the nutrition information of foods labeled as "low-fat" or "nonfat". These foods may be higher in added sugar or refined carbs and should be avoided.  Talk to your dietitian to identify your daily goals for nutrients listed on the  label. Shopping  Avoid buying canned, premade, or processed foods. These foods tend to be high in fat, sodium, and added sugar.  Shop around the outside edge of the grocery store. This includes fresh fruits and vegetables, bulk grains, fresh meats, and fresh dairy. Cooking  Use low-heat cooking methods, such as baking, instead of high-heat cooking methods like deep frying.  Cook using healthy oils, such as olive, canola, or sunflower oil.  Avoid cooking with butter, cream, or high-fat meats. Meal planning  Eat meals and snacks regularly, preferably at the same times every day. Avoid going long periods of time without eating.  Eat foods high in fiber, such as fresh fruits, vegetables, beans, and whole grains. Talk to your dietitian about how many servings of carbs you can eat at each meal.  Eat 4-6 ounces (oz) of lean protein each day, such as lean meat, chicken, fish, eggs, or tofu. One oz of lean protein is equal to: ? 1 oz of meat, chicken, or fish. ? 1 egg. ?  cup of tofu.  Eat some foods each day that contain healthy fats, such as avocado, nuts, seeds, and fish. Lifestyle  Check your blood glucose regularly.  Exercise regularly as told by your health care provider. This may include: ? 150 minutes of moderate-intensity or vigorous-intensity exercise each week. This could be brisk walking, biking, or water aerobics. ? Stretching and doing strength exercises, such as yoga or weightlifting, at least 2 times a week.  Take medicines as told by your health care provider.  Do not use any products that contain nicotine or tobacco, such as cigarettes and e-cigarettes. If you need help quitting, ask your health care provider.  Work with a Social worker or diabetes educator to identify strategies to manage stress and any emotional and social challenges. Questions to ask a health care provider  Do I need to meet with a diabetes educator?  Do I need to meet with a dietitian?  What  number can I call if I have questions?  When are the best times to check my blood glucose? Where to find more information:  American Diabetes Association: diabetes.org  Academy of Nutrition and Dietetics: www.eatright.[employer] of Diabetes and Digestive and Kidney Diseases (NIH): [employer] Summary  A healthy meal plan will help you control your blood glucose and maintain a healthy lifestyle.  Working with a diet and nutrition specialist (dietitian) can help you make a meal plan that is best for you.  Keep in mind that carbohydrates (carbs) and alcohol have immediate effects on your blood glucose levels. It is important to count carbs and to use alcohol carefully. This information is not intended to replace advice given to you by your health care provider. Make sure you discuss any questions you have with your health care provider. Document Revised: 03/02/2017 Document Reviewed: 04/24/2016 Elsevier Patient Education  2020 [employer].

## 2019-07-21 NOTE — Progress Notes (Signed)
Careteam: Patient Care Team: Lauree Chandler, NP as PCP - General (Geriatric Medicine) Carol Ada, MD as Consulting Physician (Gastroenterology) Brunetta Genera, MD as Consulting Physician (Hematology) Elsie Saas, MD as Consulting Physician (Orthopedic Surgery)  PLACE OF SERVICE:  [place] Directive information    No Known Allergies  Chief Complaint  Patient presents with  . Follow-up    2 Month Follow Up     HPI: Patient is a 64 y.o. male seen in office today to follow up blood pressure and blood sugars.   He was started on jardiance at last OV to take along with metformin- A1c has improved to 7.4 down from 10.2.  Drinking more diet drinks and more water Has been doing more walking and plans to get back in the gym and on the bike.  Plans to schedule eye appt   Hypertension- controlled on Norvasc and lisinopril  Review of Systems:  Review of Systems  Constitutional: Negative for chills, fever and weight loss.  Eyes: Positive for blurred vision (legally blind).  Respiratory: Negative for cough, sputum production and shortness of breath.   Cardiovascular: Negative for chest pain, palpitations and leg swelling.  Gastrointestinal: Negative for abdominal pain, constipation, diarrhea and heartburn.  Genitourinary: Negative for dysuria, frequency and urgency.  Musculoskeletal: Positive for joint pain (to left thumb occasionally). Negative for back pain, falls and myalgias.  Skin: Positive for itching and rash.  Neurological: Negative for dizziness and headaches.  Psychiatric/Behavioral: Negative for depression and memory loss. The patient does not have insomnia.     Past Medical History:  Diagnosis Date  . Chest pain    Per incoming records from [facility]   . Diabetes mellitus without complication ([place])   . Epilepsy ([place])   . Hypertension   . Obesity, morbid, BMI 50 or higher ([place])    Per incoming records from [facility]   . Type 2  diabetes mellitus ([place])    Past Surgical History:  Procedure Laterality Date  . COLONOSCOPY WITH PROPOFOL N/A 11/01/2018   Procedure: COLONOSCOPY WITH PROPOFOL;  Surgeon: Carol Ada, MD;  [facility];  Service: Endoscopy;  Laterality: N/A;  . KNEE ARTHROSCOPY Right 2005   Dr Shirlean Kelly  . POLYPECTOMY  11/01/2018   Procedure: POLYPECTOMY;  Surgeon: Carol Ada, MD;  [facility];  Service: Endoscopy;;   Social History:   reports that he quit smoking about 14 years ago. He has never used smokeless tobacco. He reports previous alcohol use. He reports that he does not use drugs.  No family history on file.  Medications: Patient's Medications  New Prescriptions   No medications on file  Previous Medications   AMLODIPINE (NORVASC) 10 MG TABLET    Take 1 tablet (10 mg total) by mouth daily.   ATORVASTATIN (LIPITOR) 20 MG TABLET    Take 1 tablet (20 mg total) by mouth daily.   EMPAGLIFLOZIN (JARDIANCE) 10 MG TABS TABLET    Take 10 mg by mouth daily before breakfast.   HYDROCHLOROTHIAZIDE (HYDRODIURIL) 25 MG TABLET    Take 1 tablet (25 mg total) by mouth daily.   LISINOPRIL (ZESTRIL) 10 MG TABLET    Take 1 tablet (10 mg total) by mouth daily.   METFORMIN (GLUCOPHAGE) 1000 MG TABLET    Take 1 tablet (1,000 mg total) by mouth 2 (two) times daily with a meal.  Modified Medications   No medications on file  Discontinued Medications   CLOTRIMAZOLE (LOTRIMIN) 1 % CREAM  Apply 1 application topically 2 (two) times daily.    Physical Exam:  Vitals:   07/21/19 1530  BP: 132/80  Pulse: 73  Resp: 18  Temp: 97.7 F (36.5 C)  SpO2: 97%  Weight: (!) 370 lb 3.2 oz (167.9 kg)  Height: 6\' 3"  (1.905 m)   Body mass index is 46.27 kg/m. Wt Readings from Last 3 Encounters:  07/21/19 (!) 370 lb 3.2 oz (167.9 kg)  05/19/19 (!) 376 lb 6.4 oz (170.7 kg)  04/18/19 (!) 388 lb (176 kg)    Physical Exam Constitutional:      General: He is not in acute distress.     Appearance: He is well-developed. He is not diaphoretic.  HENT:     Head: Normocephalic and atraumatic.     Mouth/Throat:     Pharynx: No oropharyngeal exudate.  Eyes:     Conjunctiva/sclera: Conjunctivae normal.     Pupils: Pupils are equal, round, and reactive to light.  Cardiovascular:     Rate and Rhythm: Normal rate and regular rhythm.     Heart sounds: Normal heart sounds.  Pulmonary:     Effort: Pulmonary effort is normal.     Breath sounds: Normal breath sounds.  Abdominal:     General: Bowel sounds are normal.     Palpations: Abdomen is soft.  Musculoskeletal:        General: No tenderness.     Cervical back: Normal range of motion and neck supple.  Skin:    General: Skin is warm and dry.     Findings: Rash (red raised patchy area to right lower leg) present.  Neurological:     Mental Status: He is alert and oriented to person, place, and time.     Labs reviewed: Basic Metabolic Panel: Recent Labs    01/13/19 0845 04/15/19 0805 07/18/19 0808  NA 138 136 138  K 4.0 4.1 4.1  CL 101 101 102  CO2 28 25 26   GLUCOSE 153* 204* 112*  BUN 11 13 11   CREATININE 1.05 1.15 0.96  CALCIUM 9.5 9.5 9.3   Liver Function Tests: Recent Labs    01/13/19 0845 07/18/19 0808  AST 23 16  ALT 13 9  BILITOT 0.4 0.4  PROT 7.4 7.2   No results for input(s): LIPASE, AMYLASE in the last 8760 hours. No results for input(s): AMMONIA in the last 8760 hours. CBC: Recent Labs    01/13/19 0845  WBC 8.7  NEUTROABS 3,758  HGB 11.7*  HCT 38.4*  MCV 77.6*  PLT 235   Lipid Panel: Recent Labs    01/13/19 0845  CHOL 123  HDL 40  LDLCALC 68  TRIG 66  CHOLHDL 3.1   TSH: No results for input(s): TSH in the last 8760 hours. A1C: Lab Results  Component Value Date   HGBA1C 7.4 (H) 07/18/2019     Assessment/Plan 1. Morbid obesity ([place]) Ongoing, reports he is working on dietary modifications and increase in physical activity for weight loss. Consider referral for weight  management/bariatic surgery.   2. Diabetes mellitus due to underlying condition with diabetic autonomic neuropathy, without long-term current use of insulin (HCC) -A1c has improved to 7.4 on current regimen. Pt reports he is planning to increase physical activity and continue to work on diet modifications to get A1c to goal of <7. Will follow up labs in 3 months.  3. Essential hypertension Controlled on current regimen.  4. Tinea corporis Ongoing, got OTC cream but was not consistent with applying.  Encouraged twice daily and educated that it could take several weeks to improve/resolve  Next appt: 3 months with labs prior  Ravynn Hogate K. [place], [facility] [phone]

## 2019-08-25 ENCOUNTER — Other Ambulatory Visit: Payer: Self-pay

## 2019-08-25 ENCOUNTER — Encounter: Payer: Self-pay | Admitting: Podiatry

## 2019-08-25 ENCOUNTER — Ambulatory Visit (INDEPENDENT_AMBULATORY_CARE_PROVIDER_SITE_OTHER): Payer: BC Managed Care – PPO

## 2019-08-25 ENCOUNTER — Ambulatory Visit: Payer: BC Managed Care – PPO | Admitting: Podiatry

## 2019-08-25 DIAGNOSIS — M7751 Other enthesopathy of right foot: Secondary | ICD-10-CM

## 2019-08-27 NOTE — Progress Notes (Signed)
   HPI: 64 y.o. male presenting today with a chief complaint of sharp pain to the right 1st MPJ that began several months ago. Touching the area increases the pain. He has not had any treatment for the symptoms. Patient is here for further evaluation and treatment.   Past Medical History:  Diagnosis Date  . Chest pain    Per incoming records from [facility]   . Diabetes mellitus without complication ([place])   . Epilepsy ([place])   . Hypertension   . Obesity, morbid, BMI 50 or higher ([place])    Per incoming records from [facility]   . Type 2 diabetes mellitus ([place])      Physical Exam: General: The patient is alert and oriented x3 in no acute distress.  Dermatology: Skin is warm, dry and supple bilateral lower extremities. Negative for open lesions or macerations.  Vascular: Palpable pedal pulses bilaterally. No edema or erythema noted. Capillary refill within normal limits.  Neurological: Epicritic and protective threshold diminished bilaterally.   Musculoskeletal Exam: Range of motion within normal limits to all pedal and ankle joints bilateral. Muscle strength 5/5 in all groups bilateral.   Radiographic Exam:  Normal osseous mineralization. Joint spaces preserved. No fracture/dislocation/boney destruction.    Assessment: 1. 1st MPJ capsulitis right    Plan of Care:  1. Patient evaluated. X-Rays reviewed.  2. Declined injections.  3. Continue wearing New Balance shoes.  4. Declined oral medications.  5. Return to clinic as needed.      Edrick Kins, DPM [facility]  Dr. Edrick Kins, DPM    [address], [place] [address]                Office [phone]  Fax [phone]

## 2019-09-15 ENCOUNTER — Other Ambulatory Visit: Payer: Self-pay | Admitting: *Deleted

## 2019-09-15 MED ORDER — ATORVASTATIN CALCIUM 20 MG PO TABS: 20.0000 mg | ORAL_TABLET | Freq: Every day | ORAL | 1 refills | Status: DC

## 2019-09-15 MED ORDER — AMLODIPINE BESYLATE 10 MG PO TABS: 10.0000 mg | ORAL_TABLET | Freq: Every day | ORAL | 1 refills | Status: DC

## 2019-09-15 NOTE — Telephone Encounter (Signed)
Patient requested refills.

## 2019-10-14 ENCOUNTER — Other Ambulatory Visit: Payer: Self-pay

## 2019-10-14 DIAGNOSIS — E0843 Diabetes mellitus due to underlying condition with diabetic autonomic (poly)neuropathy: Secondary | ICD-10-CM

## 2019-10-15 LAB — HEMOGLOBIN A1C
Hgb A1c MFr Bld: 6.4 % of total Hgb — ABNORMAL HIGH (ref <5.7)
Mean Plasma Glucose: 137 (calc)
eAG (mmol/L): 7.6 (calc)

## 2019-10-15 LAB — BASIC METABOLIC PANEL WITH GFR
BUN: 13 mg/dL (ref 7–25)
CO2: 28 mmol/L (ref 20–32)
Calcium: 9.1 mg/dL (ref 8.6–10.3)
Chloride: 105 mmol/L (ref 98–110)
Creat: 0.93 mg/dL (ref 0.70–1.25)
GFR, Est African American: 102 mL/min/{1.73_m2} (ref >=60)
GFR, Est Non African American: 88 mL/min/{1.73_m2} (ref >=60)
Glucose, Bld: 105 mg/dL — ABNORMAL HIGH (ref 65–99)
Potassium: 3.7 mmol/L (ref 3.5–5.3)
Sodium: 139 mmol/L (ref 135–146)

## 2019-10-20 ENCOUNTER — Other Ambulatory Visit: Payer: Self-pay

## 2019-10-20 ENCOUNTER — Encounter: Payer: Self-pay | Admitting: Nurse Practitioner

## 2019-10-20 ENCOUNTER — Ambulatory Visit (INDEPENDENT_AMBULATORY_CARE_PROVIDER_SITE_OTHER): Payer: BC Managed Care – PPO | Admitting: Nurse Practitioner

## 2019-10-20 VITALS — BP 124/76 | HR 72 | Temp 96.8°F | Ht 75.0 in | Wt 361.0 lb

## 2019-10-20 DIAGNOSIS — E0843 Diabetes mellitus due to underlying condition with diabetic autonomic (poly)neuropathy: Secondary | ICD-10-CM | POA: Diagnosis not present

## 2019-10-20 DIAGNOSIS — E782 Mixed hyperlipidemia: Secondary | ICD-10-CM | POA: Diagnosis not present

## 2019-10-20 DIAGNOSIS — D649 Anemia, unspecified: Secondary | ICD-10-CM | POA: Diagnosis not present

## 2019-10-20 DIAGNOSIS — I10 Essential (primary) hypertension: Secondary | ICD-10-CM

## 2019-10-20 NOTE — Progress Notes (Signed)
Careteam: Patient Care Team: Lauree Chandler, NP as PCP - General (Geriatric Medicine) Carol Ada, MD as Consulting Physician (Gastroenterology) Brunetta Genera, MD as Consulting Physician (Hematology) Elsie Saas, MD as Consulting Physician (Orthopedic Surgery)  PLACE OF SERVICE:  [place] Directive information Does Patient Have a Medical Advance Directive?: No, Would patient like information on creating a medical advance directive?: No - Patient declined  No Known Allergies  Chief Complaint  Patient presents with  . Medical Management of Chronic Issues    3 month follow-up   . Ear Problem    Patient states at times it feels as though he just got off a plane, ears feel clogged.   . Quality Metric Gaps    Discuss need for eye exam      HPI: Patient is a 64 y.o. male for routine follow up.   Occasionally will get the sensation that he feels like his ears are stopped up. He will be sitting down when it happens. Last a few minutes and then done. Not painful. Not going on now has happened about 6 times in the past year. Can still hear. No sinus congestion, runny nose, pain.   Eyes can not be measured, his optic nerve is degenerating not much he can do, plans to make an appt in the future.   Continues to lose weight, cutting back on the "junk" going to gym 3-4 times a week.   DM- a1c controlled at 6.4 on jardiance and metformin   Hyperlipidemia- LDL at 68 in October, controlled on lipitor.   HTN- at goal on norvasc, lisinopril and hctz   Review of Systems:  Review of Systems  Constitutional: Negative for chills, fever and weight loss.  Respiratory: Negative for cough, sputum production and shortness of breath.   Cardiovascular: Negative for chest pain, palpitations and leg swelling.  Gastrointestinal: Negative for abdominal pain, constipation, diarrhea and heartburn.  Genitourinary: Negative for dysuria, frequency and urgency.   Musculoskeletal: Negative for back pain, falls, joint pain and myalgias.  Skin: Negative.   Neurological: Negative for dizziness, weakness and headaches.  Psychiatric/Behavioral: Negative for depression and memory loss. The patient is not nervous/anxious and does not have insomnia.     Past Medical History:  Diagnosis Date  . Chest pain    Per incoming records from [facility]   . Diabetes mellitus without complication ([place])   . Epilepsy ([place])   . Hypertension   . Obesity, morbid, BMI 50 or higher ([place])    Per incoming records from [facility]   . Type 2 diabetes mellitus ([place])    Past Surgical History:  Procedure Laterality Date  . COLONOSCOPY WITH PROPOFOL N/A 11/01/2018   Procedure: COLONOSCOPY WITH PROPOFOL;  Surgeon: Carol Ada, MD;  Location: [facility];  Service: Endoscopy;  Laterality: N/A;  . KNEE ARTHROSCOPY Right 2005   Dr Shirlean Kelly  . POLYPECTOMY  11/01/2018   Procedure: POLYPECTOMY;  Surgeon: Carol Ada, MD;  Location: [facility];  Service: Endoscopy;;   Social History:   reports that he quit smoking about 14 years ago. He has never used smokeless tobacco. He reports previous alcohol use. He reports that he does not use drugs.  History reviewed. No pertinent family history.  Medications: Patient's Medications  New Prescriptions   No medications on file  Previous Medications   AMLODIPINE (NORVASC) 10 MG TABLET    Take 1 tablet (10 mg total) by mouth daily.   ATORVASTATIN (LIPITOR) 20 MG TABLET  Take 1 tablet (20 mg total) by mouth daily.   EMPAGLIFLOZIN (JARDIANCE) 10 MG TABS TABLET    Take 10 mg by mouth daily before breakfast.   HYDROCHLOROTHIAZIDE (HYDRODIURIL) 25 MG TABLET    Take 1 tablet (25 mg total) by mouth daily.   LISINOPRIL (ZESTRIL) 10 MG TABLET    Take 1 tablet (10 mg total) by mouth daily.   METFORMIN (GLUCOPHAGE) 1000 MG TABLET    Take 1 tablet (1,000 mg total) by mouth 2 (two) times daily with a meal.  Modified Medications   No  medications on file  Discontinued Medications   No medications on file    Physical Exam:  Vitals:   10/20/19 1420  BP: 124/76  Pulse: 72  Temp: (!) 96.8 F (36 C)  TempSrc: Temporal  SpO2: 95%  Weight: (!) 361 lb (163.7 kg)  Height: 6\' 3"  (1.905 m)   Body mass index is 45.12 kg/m. Wt Readings from Last 3 Encounters:  10/20/19 (!) 361 lb (163.7 kg)  07/21/19 (!) 370 lb 3.2 oz (167.9 kg)  05/19/19 (!) 376 lb 6.4 oz (170.7 kg)    Physical Exam Constitutional:      General: He is not in acute distress.    Appearance: He is well-developed. He is not diaphoretic.  HENT:     Head: Normocephalic and atraumatic.     Right Ear: Tympanic membrane, ear canal and external ear normal.     Left Ear: Tympanic membrane, ear canal and external ear normal.     Mouth/Throat:     Pharynx: No oropharyngeal exudate.  Cardiovascular:     Rate and Rhythm: Normal rate and regular rhythm.     Heart sounds: Normal heart sounds.  Pulmonary:     Effort: Pulmonary effort is normal.     Breath sounds: Normal breath sounds.  Abdominal:     General: Bowel sounds are normal.     Palpations: Abdomen is soft.  Musculoskeletal:        General: No tenderness.     Cervical back: Normal range of motion and neck supple.  Skin:    General: Skin is warm and dry.  Neurological:     Mental Status: He is alert and oriented to person, place, and time.  Psychiatric:        Mood and Affect: Mood normal.        Behavior: Behavior normal.     Labs reviewed: Basic Metabolic Panel: Recent Labs    04/15/19 0805 07/18/19 0808 10/14/19 0815  NA 136 138 139  K 4.1 4.1 3.7  CL 101 102 105  CO2 25 26 28   GLUCOSE 204* 112* 105*  BUN 13 11 13   CREATININE 1.15 0.96 0.93  CALCIUM 9.5 9.3 9.1   Liver Function Tests: Recent Labs    01/13/19 0845 07/18/19 0808  AST 23 16  ALT 13 9  BILITOT 0.4 0.4  PROT 7.4 7.2   No results for input(s): LIPASE, AMYLASE in the last 8760 hours. No results for  input(s): AMMONIA in the last 8760 hours. CBC: Recent Labs    01/13/19 0845  WBC 8.7  NEUTROABS 3,758  HGB 11.7*  HCT 38.4*  MCV 77.6*  PLT 235   Lipid Panel: Recent Labs    01/13/19 0845  CHOL 123  HDL 40  LDLCALC 68  TRIG 66  CHOLHDL 3.1   TSH: No results for input(s): TSH in the last 8760 hours. A1C: Lab Results  Component Value Date  HGBA1C 6.4 (H) 10/14/2019     Assessment/Plan 1. Diabetes mellitus due to underlying condition with diabetic autonomic neuropathy, without long-term current use of insulin (HCC) Improved controlled with dietary modifications and increase in physical activity.  Encouraged dietary compliance, routine foot care/monitoring and to keep up with diabetic eye exams through ophthalmology  -will continue current regimen. - Hemoglobin A1c; Future  2. Essential hypertension -stable. Continue on lisinopril, norvasc and hctz - COMPLETE METABOLIC PANEL WITH GFR; Future - CBC with Differential/Platelet; Future  3. Mixed hyperlipidemia -continues on lipitor 20 mg daily with dietary modifications.  - Lipid Panel; Future  4. Anemia, unspecified type -CBC with Differential/Platelet; Future  5. Morbid obesity ([place]) -actively trying to lose weight with diet and exercise. Praised for weight loss to continue with diet and exercise.   Next appt: 4 months with labs prior  Jessica K. [place], [facility] [phone]

## 2019-11-19 ENCOUNTER — Other Ambulatory Visit: Payer: Self-pay | Admitting: *Deleted

## 2019-11-19 DIAGNOSIS — E0843 Diabetes mellitus due to underlying condition with diabetic autonomic (poly)neuropathy: Secondary | ICD-10-CM

## 2019-11-19 MED ORDER — HYDROCHLOROTHIAZIDE 25 MG PO TABS: 25.0000 mg | ORAL_TABLET | Freq: Every day | ORAL | 1 refills | Status: DC

## 2019-11-19 MED ORDER — EMPAGLIFLOZIN 10 MG PO TABS: 10.0000 mg | ORAL_TABLET | Freq: Every day | ORAL | 1 refills | Status: DC

## 2019-11-19 NOTE — Telephone Encounter (Signed)
Patient requested refills.

## 2019-12-24 ENCOUNTER — Ambulatory Visit: Payer: BC Managed Care – PPO | Admitting: Podiatry

## 2019-12-24 ENCOUNTER — Other Ambulatory Visit: Payer: Self-pay

## 2019-12-24 DIAGNOSIS — M7751 Other enthesopathy of right foot: Secondary | ICD-10-CM

## 2019-12-24 NOTE — Progress Notes (Signed)
   HPI: 64 y.o. male presenting today for follow-up evaluation of first MTPJ capsulitis to the right foot.  He has had this pain for several months now and it is slowly increased.  Patient was last seen in the office on 08/25/2019 at which time he declined any steroid injection or oral anti-inflammatory medication.  He has continued to have some pain and achiness to the great toe joint.  He presents for further treatment evaluation   Past Medical History:  Diagnosis Date  . Chest pain    Per incoming records from [facility]   . Diabetes mellitus without complication ([place])   . Epilepsy ([place])   . Hypertension   . Obesity, morbid, BMI 50 or higher ([place])    Per incoming records from [facility]   . Type 2 diabetes mellitus ([place])      Physical Exam: General: The patient is alert and oriented x3 in no acute distress.  Dermatology: Skin is warm, dry and supple bilateral lower extremities. Negative for open lesions or macerations.  Vascular: Palpable pedal pulses bilaterally. No edema or erythema noted. Capillary refill within normal limits.  Neurological: Epicritic and protective threshold diminished bilaterally.   Musculoskeletal Exam: Range of motion within normal limits to all pedal and ankle joints bilateral. Muscle strength 5/5 in all groups bilateral.   Radiographic Exam:  Normal osseous mineralization. Joint spaces preserved. No fracture/dislocation/boney destruction.    Assessment: 1. 1st MPJ capsulitis right    Plan of Care:  1. Patient evaluated.  Prior X-Rays reviewed.  2.  Today the patient opted to have a steroidal anti-inflammatory injection.  Injection of 0.5 cc Celestone Soluspan injected into the first MTPJ of the right foot  3. Continue wearing New Balance shoes.  4. Declined oral medications.  5. Return to clinic as needed.      Edrick Kins, DPM [facility]  Dr. Edrick Kins, DPM    [address], [place] [address]                Office [phone]  Fax [phone]

## 2020-02-17 ENCOUNTER — Other Ambulatory Visit: Payer: Self-pay

## 2020-02-17 DIAGNOSIS — D508 Other iron deficiency anemias: Secondary | ICD-10-CM | POA: Diagnosis not present

## 2020-02-17 DIAGNOSIS — E782 Mixed hyperlipidemia: Secondary | ICD-10-CM | POA: Diagnosis not present

## 2020-02-17 DIAGNOSIS — I10 Essential (primary) hypertension: Secondary | ICD-10-CM

## 2020-02-17 DIAGNOSIS — E0843 Diabetes mellitus due to underlying condition with diabetic autonomic (poly)neuropathy: Secondary | ICD-10-CM | POA: Diagnosis not present

## 2020-02-20 ENCOUNTER — Ambulatory Visit: Payer: Self-pay | Admitting: Nurse Practitioner

## 2020-02-21 LAB — COMPLETE METABOLIC PANEL WITH GFR
AG Ratio: 1.4 (calc) (ref 1.0–2.5)
ALT: 9 U/L (ref 9–46)
AST: 16 U/L (ref 10–35)
Albumin: 4.2 g/dL (ref 3.6–5.1)
Alkaline phosphatase (APISO): 98 U/L (ref 35–144)
BUN: 14 mg/dL (ref 7–25)
CO2: 26 mmol/L (ref 20–32)
Calcium: 9.5 mg/dL (ref 8.6–10.3)
Chloride: 104 mmol/L (ref 98–110)
Creat: 0.98 mg/dL (ref 0.70–1.25)
GFR, Est African American: 95 mL/min/{1.73_m2} (ref >=60)
GFR, Est Non African American: 82 mL/min/{1.73_m2} (ref >=60)
Globulin: 3.1 g/dL (calc) (ref 1.9–3.7)
Glucose, Bld: 93 mg/dL (ref 65–99)
Potassium: 4.2 mmol/L (ref 3.5–5.3)
Sodium: 140 mmol/L (ref 135–146)
Total Bilirubin: 0.4 mg/dL (ref 0.2–1.2)
Total Protein: 7.3 g/dL (ref 6.1–8.1)

## 2020-02-21 LAB — CBC WITH DIFFERENTIAL/PLATELET
Absolute Monocytes: 408 cells/uL (ref 200–950)
Basophils Absolute: 43 cells/uL (ref 0–200)
Basophils Relative: 0.5 %
Eosinophils Absolute: 187 cells/uL (ref 15–500)
Eosinophils Relative: 2.2 %
HCT: 41.2 % (ref 38.5–50.0)
Hemoglobin: 12.2 g/dL — ABNORMAL LOW (ref 13.2–17.1)
Lymphs Abs: 4174 cells/uL — ABNORMAL HIGH (ref 850–3900)
MCH: 22.8 pg — ABNORMAL LOW (ref 27.0–33.0)
MCHC: 29.6 g/dL — ABNORMAL LOW (ref 32.0–36.0)
MCV: 76.9 fL — ABNORMAL LOW (ref 80.0–100.0)
MPV: 12.4 fL (ref 7.5–12.5)
Monocytes Relative: 4.8 %
Neutro Abs: 3689 cells/uL (ref 1500–7800)
Neutrophils Relative %: 43.4 %
Platelets: 238 10*3/uL (ref 140–400)
RBC: 5.36 10*6/uL (ref 4.20–5.80)
RDW: 14.1 % (ref 11.0–15.0)
Total Lymphocyte: 49.1 %
WBC: 8.5 10*3/uL (ref 3.8–10.8)

## 2020-02-21 LAB — LIPID PANEL
Cholesterol: 123 mg/dL (ref <200)
HDL: 44 mg/dL (ref >=40)
LDL Cholesterol (Calc): 64 mg/dL (calc)
Non-HDL Cholesterol (Calc): 79 mg/dL (calc) (ref <130)
Total CHOL/HDL Ratio: 2.8 (calc) (ref <5.0)
Triglycerides: 72 mg/dL (ref <150)

## 2020-02-21 LAB — IRON,TIBC AND FERRITIN PANEL
%SAT: 20 % (calc) (ref 20–48)
Ferritin: 99 ng/mL (ref 24–380)
Iron: 51 ug/dL (ref 50–180)
TIBC: 261 mcg/dL (calc) (ref 250–425)

## 2020-02-21 LAB — TEST AUTHORIZATION

## 2020-02-21 LAB — HEMOGLOBIN A1C
Hgb A1c MFr Bld: 6.5 % of total Hgb — ABNORMAL HIGH (ref <5.7)
Mean Plasma Glucose: 140 (calc)
eAG (mmol/L): 7.7 (calc)

## 2020-02-25 ENCOUNTER — Ambulatory Visit (INDEPENDENT_AMBULATORY_CARE_PROVIDER_SITE_OTHER): Payer: BC Managed Care – PPO | Admitting: Nurse Practitioner

## 2020-02-25 ENCOUNTER — Other Ambulatory Visit: Payer: Self-pay

## 2020-02-25 ENCOUNTER — Encounter: Payer: Self-pay | Admitting: Nurse Practitioner

## 2020-02-25 VITALS — BP 126/74 | HR 75 | Temp 96.8°F | Ht 75.0 in | Wt 352.0 lb

## 2020-02-25 DIAGNOSIS — D649 Anemia, unspecified: Secondary | ICD-10-CM

## 2020-02-25 DIAGNOSIS — Z23 Encounter for immunization: Secondary | ICD-10-CM | POA: Diagnosis not present

## 2020-02-25 DIAGNOSIS — N529 Male erectile dysfunction, unspecified: Secondary | ICD-10-CM | POA: Diagnosis not present

## 2020-02-25 DIAGNOSIS — E0843 Diabetes mellitus due to underlying condition with diabetic autonomic (poly)neuropathy: Secondary | ICD-10-CM

## 2020-02-25 DIAGNOSIS — E782 Mixed hyperlipidemia: Secondary | ICD-10-CM

## 2020-02-25 DIAGNOSIS — M1990 Unspecified osteoarthritis, unspecified site: Secondary | ICD-10-CM

## 2020-02-25 DIAGNOSIS — I10 Essential (primary) hypertension: Secondary | ICD-10-CM

## 2020-02-25 MED ORDER — TADALAFIL 10 MG PO TABS: 10.0000 mg | ORAL_TABLET | Freq: Every day | ORAL | 0 refills | Status: DC | PRN

## 2020-02-25 NOTE — Patient Instructions (Signed)
New prescription for cialis sent to pharmacy To only use 1 tablet daily as needed for Erectile dysfunction you may be able to have sexual activity 30 minutes after taking it and for up to 36 hours after taking it.   Continue to work on weight loss, GREAT WORK  To eat foods higher in Iron as your levels are borderline low.

## 2020-02-25 NOTE — Progress Notes (Signed)
Careteam: Patient Care Team: Lauree Chandler, NP as PCP - General (Geriatric Medicine) Carol Ada, MD as Consulting Physician (Gastroenterology) Brunetta Genera, MD as Consulting Physician (Hematology) Elsie Saas, MD as Consulting Physician (Orthopedic Surgery)  PLACE OF SERVICE:  [place] Directive information    No Known Allergies  Chief Complaint  Patient presents with  . Medical Management of Chronic Issues    4 month follow-up. Flu vaccine today. Discuss need for eye exam, hep c screening, HIV screening. Foot exam today. Discuss labs (copy printed)      HPI: Patient is a 64 y.o. male for routine follow up.   Has continue to lost weight with diet and trying to increase physical activity.   Dm- A1c 6.5 will occasionally not take medication. No low blood sugars noted . Followed by podiatry due to diabetes and ongoing right foot pain.   S/p injection to right foot due to capsulitis of right MTP joint- reports improvement in pain.   Hyperlipidemia- LDL at goal, pt reports he is not always compliant with medication. Working on dietary modifications.  Reports ongoing problems with ED, unable to obtain and maintain proper erection for intercourse.   htn- controlled on norvasc and lisinopril  Review of Systems:  Review of Systems  Constitutional: Negative for chills, fever and weight loss.  HENT: Negative for tinnitus.   Respiratory: Negative for cough, sputum production and shortness of breath.   Cardiovascular: Negative for chest pain, palpitations and leg swelling.  Gastrointestinal: Negative for abdominal pain, constipation, diarrhea and heartburn.  Genitourinary: Negative for dysuria, frequency and urgency.  Musculoskeletal: Positive for joint pain (knees). Negative for back pain, falls and myalgias.  Skin: Negative.   Neurological: Negative for dizziness and headaches.  Psychiatric/Behavioral: Negative for depression and memory loss.  The patient does not have insomnia.     Past Medical History:  Diagnosis Date  . Chest pain    [facility]   . Diabetes mellitus without complication ([place])   . Epilepsy ([place])   . Hypertension   . Obesity, morbid, BMI 50 or higher ([place])    Per incoming records from [facility]   . Type 2 diabetes mellitus ([place])    Past Surgical History:  Procedure Laterality Date  . COLONOSCOPY WITH PROPOFOL N/A 11/01/2018   Procedure: COLONOSCOPY WITH PROPOFOL;  Surgeon: Carol Ada, MD;  [facility];  Service: Endoscopy;  Laterality: N/A;  . KNEE ARTHROSCOPY Right 2005   Dr Shirlean Kelly  . POLYPECTOMY  11/01/2018   Procedure: POLYPECTOMY;  Surgeon: Carol Ada, MD;  [facility];  Service: Endoscopy;;   Social History:   reports that he quit smoking about 15 years ago. He has never used smokeless tobacco. He reports previous alcohol use. He reports that he does not use drugs.  History reviewed. No pertinent family history.  Medications: Patient's Medications  New Prescriptions   No medications on file  Previous Medications   AMLODIPINE (NORVASC) 10 MG TABLET    Take 1 tablet (10 mg total) by mouth daily.   ATORVASTATIN (LIPITOR) 20 MG TABLET    Take 1 tablet (20 mg total) by mouth daily.   EMPAGLIFLOZIN (JARDIANCE) 10 MG TABS TABLET    Take 1 tablet (10 mg total) by mouth daily before breakfast.   HYDROCHLOROTHIAZIDE (HYDRODIURIL) 25 MG TABLET    Take 1 tablet (25 mg total) by mouth daily.   LISINOPRIL (ZESTRIL) 10 MG TABLET    Take  1 tablet (10 mg total) by mouth daily.   METFORMIN (GLUCOPHAGE) 1000 MG TABLET    Take 1 tablet (1,000 mg total) by mouth 2 (two) times daily with a meal.  Modified Medications   No medications on file  Discontinued Medications   No medications on file    Physical Exam:  Vitals:   02/25/20 1411  BP: 126/74  Pulse: 75  Temp: (!) 96.8 F (36 C)  TempSrc: Temporal  SpO2: 97%  Weight: (!) 352 lb (159.7 kg)   Height: 6\' 3"  (1.905 m)   Body mass index is 44 kg/m. Wt Readings from Last 3 Encounters:  02/25/20 (!) 352 lb (159.7 kg)  10/20/19 (!) 361 lb (163.7 kg)  07/21/19 (!) 370 lb 3.2 oz (167.9 kg)    Physical Exam Constitutional:      General: He is not in acute distress.    Appearance: He is well-developed. He is not diaphoretic.  HENT:     Head: Normocephalic and atraumatic.     Nose: No congestion.     Mouth/Throat:     Pharynx: No oropharyngeal exudate.  Cardiovascular:     Rate and Rhythm: Normal rate and regular rhythm.     Pulses:          Dorsalis pedis pulses are 2+ on the right side and 2+ on the left side.     Heart sounds: Normal heart sounds.  Pulmonary:     Effort: Pulmonary effort is normal.     Breath sounds: Normal breath sounds.  Abdominal:     General: Bowel sounds are normal.     Palpations: Abdomen is soft.  Musculoskeletal:        General: No tenderness.     Cervical back: Normal range of motion and neck supple.  Feet:     Right foot:     Protective Sensation: 4 sites tested. 4 sites sensed.     Skin integrity: Dry skin present.     Toenail Condition: Right toenails are abnormally thick and long. Fungal disease present.    Left foot:     Protective Sensation: 4 sites tested. 4 sites sensed.     Skin integrity: Dry skin present.     Toenail Condition: Left toenails are abnormally thick and long. Fungal disease present. Skin:    General: Skin is warm and dry.  Neurological:     Mental Status: He is alert and oriented to person, place, and time.     Labs reviewed: Basic Metabolic Panel: Recent Labs    07/18/19 0808 10/14/19 0815 02/17/20 0806  NA 138 139 140  K 4.1 3.7 4.2  CL 102 105 104  CO2 26 28 26   GLUCOSE 112* 105* 93  BUN 11 13 14   CREATININE 0.96 0.93 0.98  CALCIUM 9.3 9.1 9.5   Liver Function Tests: Recent Labs    07/18/19 0808 02/17/20 0806  AST 16 16  ALT 9 9  BILITOT 0.4 0.4  PROT 7.2 7.3   No results for  input(s): LIPASE, AMYLASE in the last 8760 hours. No results for input(s): AMMONIA in the last 8760 hours. CBC: Recent Labs    02/17/20 0806  WBC 8.5  NEUTROABS 3,689  HGB 12.2*  HCT 41.2  MCV 76.9*  PLT 238   Lipid Panel: Recent Labs    02/17/20 0806  CHOL 123  HDL 44  LDLCALC 64  TRIG 72  CHOLHDL 2.8   TSH: No results for input(s): TSH in the last 8760  hours. A1C: Lab Results  Component Value Date   HGBA1C 6.5 (H) 02/17/2020     Assessment/Plan 1. Need for influenza vaccination - Flu Vaccine QUAD High Dose(Fluad)  2. Erectile dysfunction, unspecified erectile dysfunction type - tadalafil (CIALIS) 10 MG tablet; Take 1 tablet (10 mg total) by mouth daily as needed for erectile dysfunction.  Dispense: 10 tablet; Refill: 0  3. Anemia, unspecified type -iron level borderline low, plans to increase iron in diet. Will continue to monitor  4. Morbid obesity ([place]) Has lost 20 lbs, continues to work on diet and exercise for ongoing weight loss.   5. Essential hypertension -blood pressure controlled on current regimen, encouraged compliance with taking medication daily and continue with dietary modifications.   6. Mixed hyperlipidemia LDL at goal on lipitor. Encouraged compliance with medication and continue with dietary modifications.   7. Diabetes mellitus due to underlying condition with diabetic autonomic neuropathy, without whether long term insulin use (HCC) - Flu Vaccine QUAD High Dose(Fluad) -a1c continues to be at goal.  -continue medication as prescribed -Encouraged dietary compliance, routine foot care/monitoring and to keep up with diabetic eye exams through ophthalmology  -encouraged follow up with ophthalmologist.  8. Arthritis Ongoing to knees. Continues to work on weight loss and exercise to help improve symptoms.   Next appt: 6 months, labs prior  Inez Stantz K. [place], [facility] [phone]

## 2020-03-17 ENCOUNTER — Other Ambulatory Visit: Payer: Self-pay | Admitting: Nurse Practitioner

## 2020-03-17 DIAGNOSIS — E0843 Diabetes mellitus due to underlying condition with diabetic autonomic (poly)neuropathy: Secondary | ICD-10-CM

## 2020-03-17 DIAGNOSIS — I10 Essential (primary) hypertension: Secondary | ICD-10-CM

## 2020-04-01 DIAGNOSIS — Z20828 Contact with and (suspected) exposure to other viral communicable diseases: Secondary | ICD-10-CM | POA: Diagnosis not present

## 2020-04-02 DIAGNOSIS — Z20828 Contact with and (suspected) exposure to other viral communicable diseases: Secondary | ICD-10-CM | POA: Diagnosis not present

## 2020-04-05 DIAGNOSIS — Z20828 Contact with and (suspected) exposure to other viral communicable diseases: Secondary | ICD-10-CM | POA: Diagnosis not present

## 2020-04-06 DIAGNOSIS — Z20828 Contact with and (suspected) exposure to other viral communicable diseases: Secondary | ICD-10-CM | POA: Diagnosis not present

## 2020-04-06 DIAGNOSIS — U071 COVID-19: Secondary | ICD-10-CM | POA: Diagnosis not present

## 2020-04-23 IMAGING — DX DG FOOT COMPLETE 3+V*L*
4 series · 4 of 4 positions shown · non-contrast
Comparison: None.

CLINICAL DATA: Pain and soft tissue infection between 1st and 2nd
toes.

EXAM:
LEFT FOOT - COMPLETE 3+ VIEW

[foot ap]
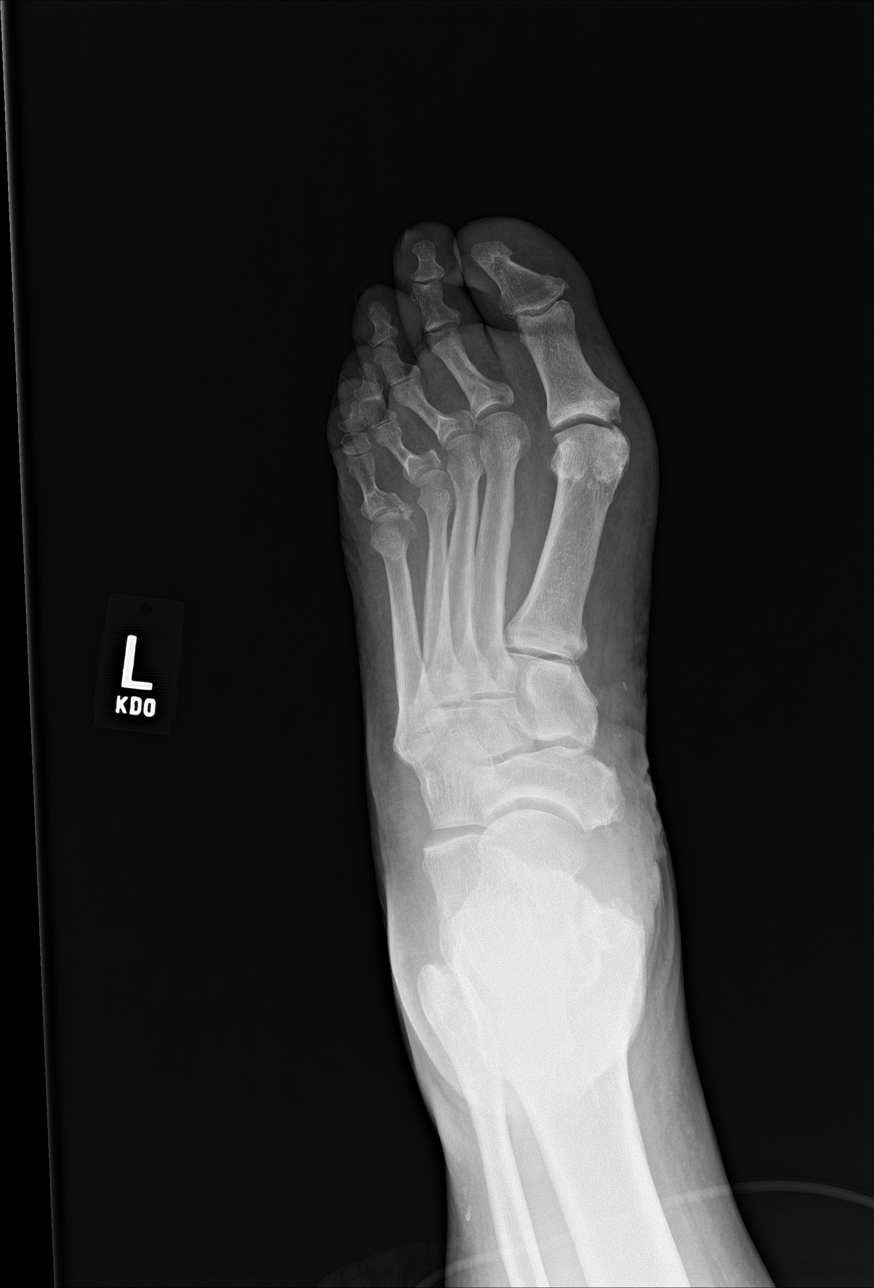

[foot obl]
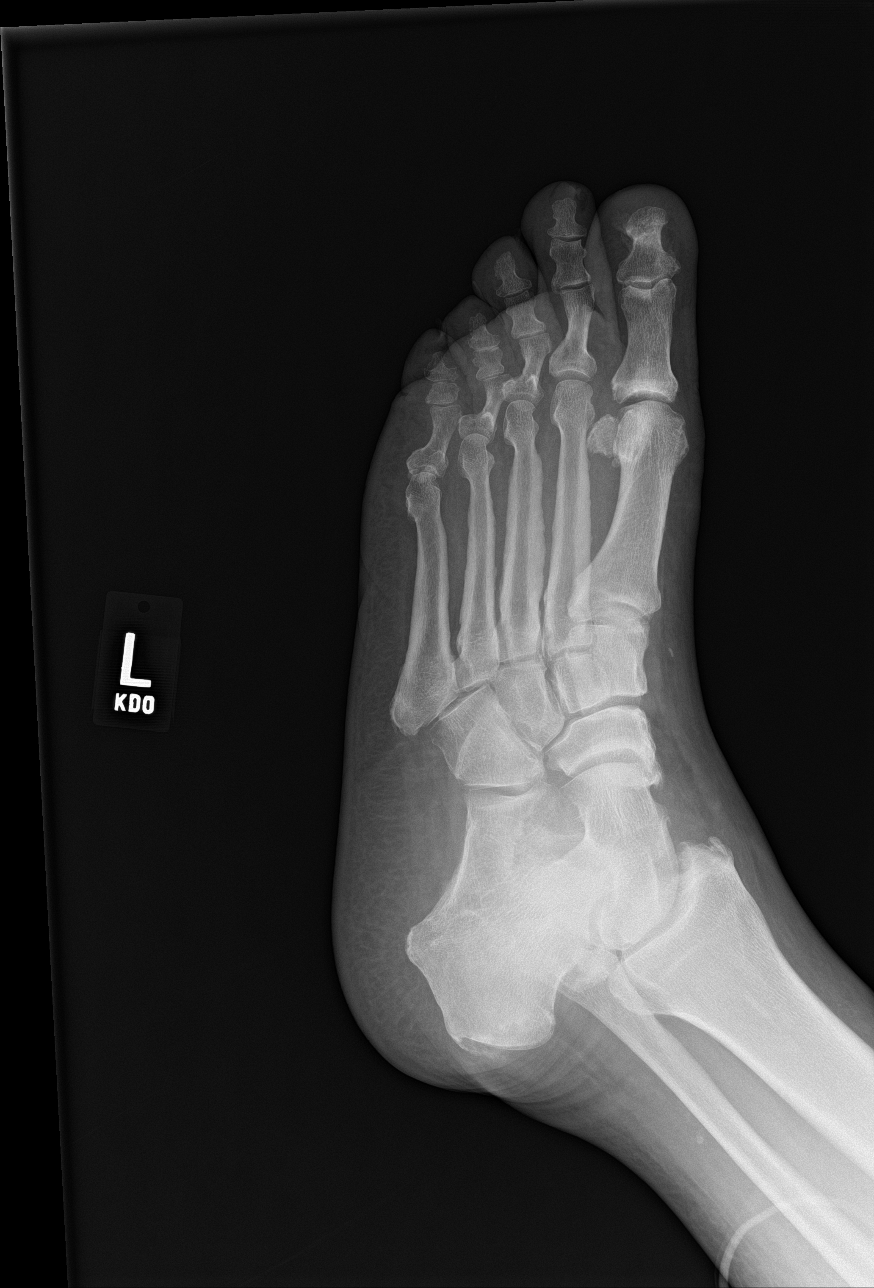

[foot lat (1 of 2)]
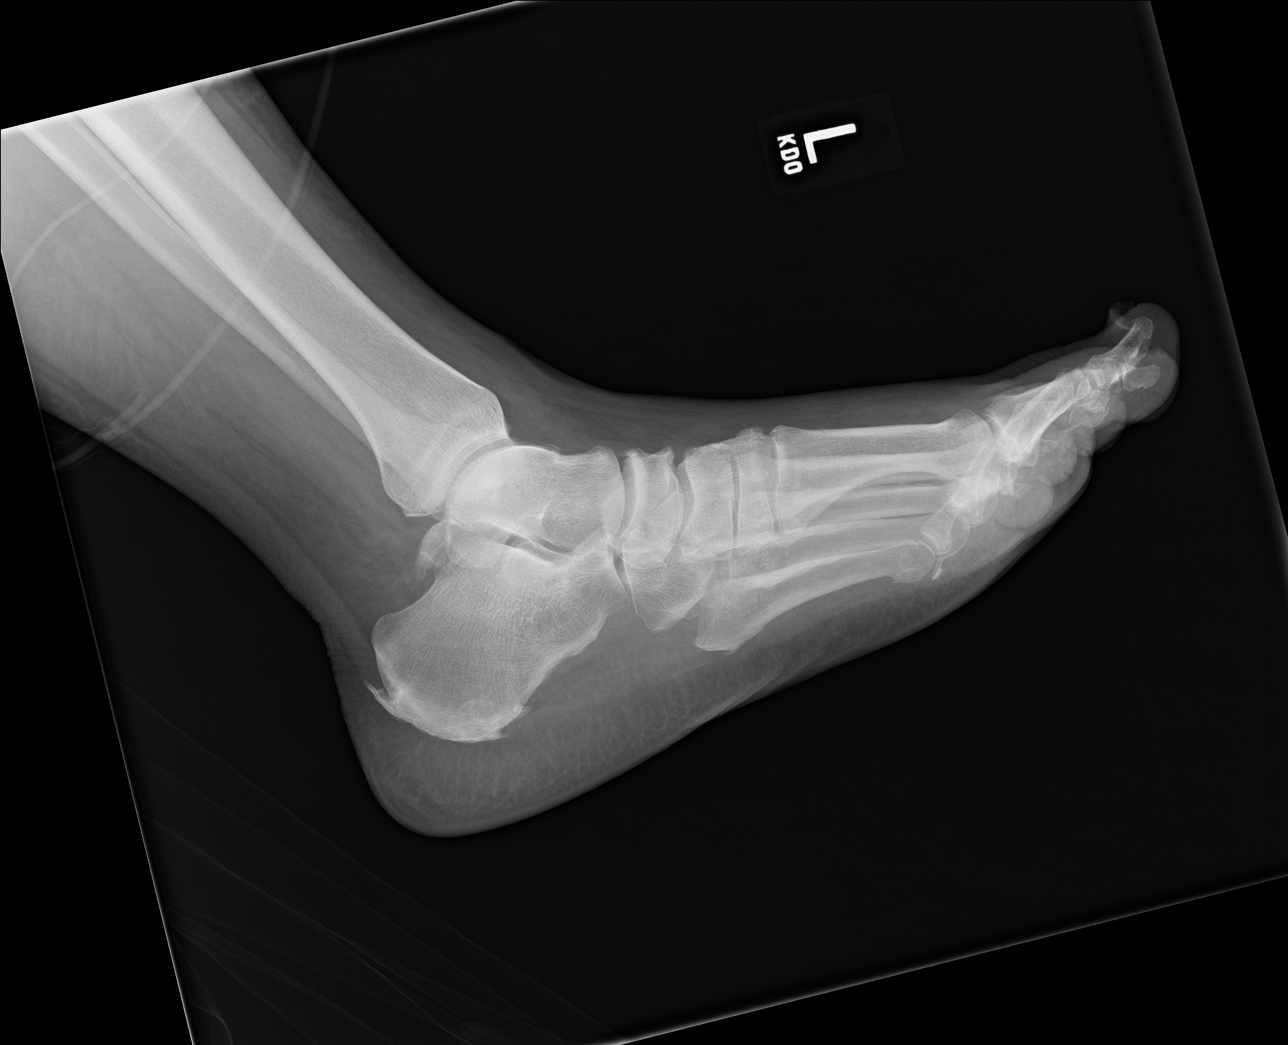

[foot lat (2 of 2)]
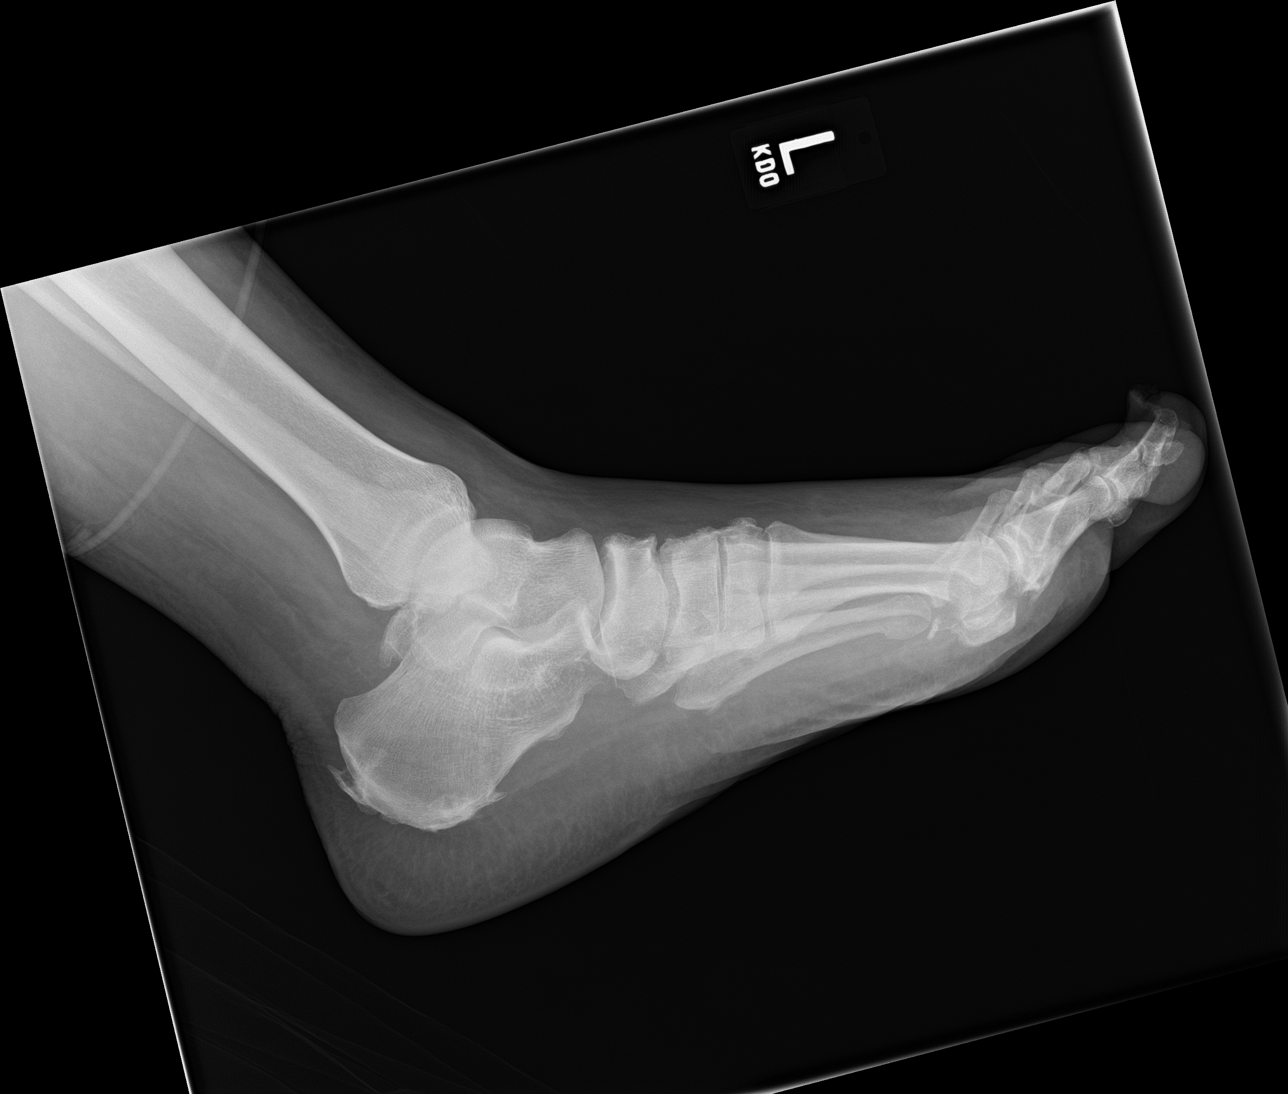

[4 of 4 positions shown; findings below may reference images not displayed]

FINDINGS: There is no evidence of fracture or dislocation. No evidence of
osteolysis or periostitis. There is no evidence of arthropathy.
Small plantar and dorsal calcaneal bone spurs noted. Soft tissues
are unremarkable.
IMPRESSION: No acute findings.

## 2020-05-07 ENCOUNTER — Other Ambulatory Visit: Payer: Self-pay | Admitting: Nurse Practitioner

## 2020-05-07 DIAGNOSIS — E0843 Diabetes mellitus due to underlying condition with diabetic autonomic (poly)neuropathy: Secondary | ICD-10-CM

## 2020-05-17 ENCOUNTER — Other Ambulatory Visit: Payer: Self-pay | Admitting: Nurse Practitioner

## 2020-06-03 DIAGNOSIS — M19071 Primary osteoarthritis, right ankle and foot: Secondary | ICD-10-CM | POA: Diagnosis not present

## 2020-06-03 DIAGNOSIS — M76821 Posterior tibial tendinitis, right leg: Secondary | ICD-10-CM | POA: Diagnosis not present

## 2020-06-03 DIAGNOSIS — M778 Other enthesopathies, not elsewhere classified: Secondary | ICD-10-CM | POA: Diagnosis not present

## 2020-06-15 ENCOUNTER — Ambulatory Visit: Payer: BC Managed Care – PPO | Admitting: Sports Medicine

## 2020-06-15 ENCOUNTER — Other Ambulatory Visit: Payer: Self-pay

## 2020-06-15 ENCOUNTER — Encounter: Payer: Self-pay | Admitting: Sports Medicine

## 2020-06-15 VITALS — BP 130/64 | Ht 75.0 in | Wt 340.0 lb

## 2020-06-15 DIAGNOSIS — M2141 Flat foot [pes planus] (acquired), right foot: Secondary | ICD-10-CM

## 2020-06-15 DIAGNOSIS — M2142 Flat foot [pes planus] (acquired), left foot: Secondary | ICD-10-CM | POA: Diagnosis not present

## 2020-06-15 NOTE — Progress Notes (Addendum)
Michael [name] is a 65 year old male who presents as a referral from Dr. Noemi Chapel at [facility] orthopedics for custom orthotics.  He has a history of pes planus with chronic medial ankle pain and posterior tibialis insufficiency.  He also had degenerative arthritis in the tibiotalar joint and medial malleolus.  At his last visit with Dr. Noemi Chapel on 06/03/2020, is recommended that he consider being fitted for custom orthotics for which he presents today.  Patient was fitted for a : standard, cushioned, semi-rigid orthotic. The orthotic was heated and afterward the patient stood on the orthotic blank positioned on the orthotic stand. The patient was positioned in subtalar neutral position and 10 degrees of ankle dorsiflexion in a weight bearing stance. After completion of molding, a stable base was applied to the orthotic blank. The blank was ground to a stable position for weight bearing. Size: 16 Base: blue EVA Posting: none Additional orthotic padding: none  Patient is actually a size 15, however a size 16 was used and the length was shortened to approximate his shoe length.  After fitting custom orthotics, patient trialed them in his shoes with walking.  His gait showed correction to near neutral, and he reported comfort while wearing the orthotics.  We will plan to trial these in his shoes and see how this affects his pain.  We discussed that if he has any discomfort or concerns, then to come back for modifications.  Michael Ligas, MD Sports Medicine Fellow, [place]  Patient seen and evaluated with the sports medicine fellow.  I agree with the above plan of care.  Orthotics were constructed as above.  Follow-up as needed.

## 2020-07-14 ENCOUNTER — Other Ambulatory Visit: Payer: Self-pay | Admitting: Nurse Practitioner

## 2020-07-14 DIAGNOSIS — E0843 Diabetes mellitus due to underlying condition with diabetic autonomic (poly)neuropathy: Secondary | ICD-10-CM

## 2020-07-14 DIAGNOSIS — I10 Essential (primary) hypertension: Secondary | ICD-10-CM

## 2020-07-20 ENCOUNTER — Other Ambulatory Visit: Payer: Self-pay | Admitting: Nurse Practitioner

## 2020-07-20 DIAGNOSIS — E0843 Diabetes mellitus due to underlying condition with diabetic autonomic (poly)neuropathy: Secondary | ICD-10-CM

## 2020-08-18 ENCOUNTER — Ambulatory Visit: Payer: Self-pay | Admitting: Nurse Practitioner

## 2020-09-10 ENCOUNTER — Other Ambulatory Visit: Payer: Self-pay

## 2020-09-10 ENCOUNTER — Other Ambulatory Visit: Payer: BC Managed Care – PPO

## 2020-09-10 DIAGNOSIS — E0843 Diabetes mellitus due to underlying condition with diabetic autonomic (poly)neuropathy: Secondary | ICD-10-CM | POA: Diagnosis not present

## 2020-09-10 DIAGNOSIS — E782 Mixed hyperlipidemia: Secondary | ICD-10-CM | POA: Diagnosis not present

## 2020-09-10 DIAGNOSIS — D649 Anemia, unspecified: Secondary | ICD-10-CM

## 2020-09-10 DIAGNOSIS — I10 Essential (primary) hypertension: Secondary | ICD-10-CM | POA: Diagnosis not present

## 2020-09-11 LAB — CBC WITH DIFFERENTIAL/PLATELET
Absolute Monocytes: 448 cells/uL (ref 200–950)
Basophils Absolute: 33 cells/uL (ref 0–200)
Basophils Relative: 0.4 %
Eosinophils Absolute: 191 cells/uL (ref 15–500)
Eosinophils Relative: 2.3 %
HCT: 41.8 % (ref 38.5–50.0)
Hemoglobin: 12.8 g/dL — ABNORMAL LOW (ref 13.2–17.1)
Lymphs Abs: 4457 cells/uL — ABNORMAL HIGH (ref 850–3900)
MCH: 23.7 pg — ABNORMAL LOW (ref 27.0–33.0)
MCHC: 30.6 g/dL — ABNORMAL LOW (ref 32.0–36.0)
MCV: 77.3 fL — ABNORMAL LOW (ref 80.0–100.0)
MPV: 13 fL — ABNORMAL HIGH (ref 7.5–12.5)
Monocytes Relative: 5.4 %
Neutro Abs: 3171 cells/uL (ref 1500–7800)
Neutrophils Relative %: 38.2 %
Platelets: 261 10*3/uL (ref 140–400)
RBC: 5.41 10*6/uL (ref 4.20–5.80)
RDW: 14 % (ref 11.0–15.0)
Total Lymphocyte: 53.7 %
WBC: 8.3 10*3/uL (ref 3.8–10.8)

## 2020-09-11 LAB — LIPID PANEL
Cholesterol: 116 mg/dL (ref <200)
HDL: 41 mg/dL (ref >=40)
LDL Cholesterol (Calc): 59 mg/dL (calc)
Non-HDL Cholesterol (Calc): 75 mg/dL (calc) (ref <130)
Total CHOL/HDL Ratio: 2.8 (calc) (ref <5.0)
Triglycerides: 77 mg/dL (ref <150)

## 2020-09-11 LAB — COMPLETE METABOLIC PANEL WITH GFR
AG Ratio: 1.3 (calc) (ref 1.0–2.5)
ALT: 7 U/L — ABNORMAL LOW (ref 9–46)
AST: 15 U/L (ref 10–35)
Albumin: 4.3 g/dL (ref 3.6–5.1)
Alkaline phosphatase (APISO): 94 U/L (ref 35–144)
BUN: 12 mg/dL (ref 7–25)
CO2: 27 mmol/L (ref 20–32)
Calcium: 9.6 mg/dL (ref 8.6–10.3)
Chloride: 102 mmol/L (ref 98–110)
Creat: 0.93 mg/dL (ref 0.70–1.25)
GFR, Est African American: 101 mL/min/{1.73_m2} (ref >=60)
GFR, Est Non African American: 87 mL/min/{1.73_m2} (ref >=60)
Globulin: 3.3 g/dL (calc) (ref 1.9–3.7)
Glucose, Bld: 98 mg/dL (ref 65–99)
Potassium: 4.1 mmol/L (ref 3.5–5.3)
Sodium: 139 mmol/L (ref 135–146)
Total Bilirubin: 0.5 mg/dL (ref 0.2–1.2)
Total Protein: 7.6 g/dL (ref 6.1–8.1)

## 2020-09-11 LAB — HEMOGLOBIN A1C
Hgb A1c MFr Bld: 6.3 % of total Hgb — ABNORMAL HIGH (ref <5.7)
Mean Plasma Glucose: 134 mg/dL
eAG (mmol/L): 7.4 mmol/L

## 2020-09-15 ENCOUNTER — Ambulatory Visit: Payer: BC Managed Care – PPO | Admitting: Nurse Practitioner

## 2020-09-15 ENCOUNTER — Other Ambulatory Visit: Payer: Self-pay

## 2020-09-15 ENCOUNTER — Encounter: Payer: Self-pay | Admitting: Nurse Practitioner

## 2020-09-15 VITALS — BP 122/78 | HR 94 | Temp 96.6°F | Ht 75.0 in | Wt 342.6 lb

## 2020-09-15 DIAGNOSIS — N529 Male erectile dysfunction, unspecified: Secondary | ICD-10-CM

## 2020-09-15 DIAGNOSIS — M1990 Unspecified osteoarthritis, unspecified site: Secondary | ICD-10-CM

## 2020-09-15 DIAGNOSIS — I10 Essential (primary) hypertension: Secondary | ICD-10-CM

## 2020-09-15 DIAGNOSIS — E782 Mixed hyperlipidemia: Secondary | ICD-10-CM

## 2020-09-15 DIAGNOSIS — D649 Anemia, unspecified: Secondary | ICD-10-CM

## 2020-09-15 DIAGNOSIS — E0843 Diabetes mellitus due to underlying condition with diabetic autonomic (poly)neuropathy: Secondary | ICD-10-CM

## 2020-09-15 MED ORDER — ATORVASTATIN CALCIUM 20 MG PO TABS: 1.0000 | ORAL_TABLET | Freq: Every day | ORAL | 0 refills | Status: DC

## 2020-09-15 NOTE — Patient Instructions (Signed)
Try to get back into exercise and proper nutrition for weight loss.

## 2020-09-15 NOTE — Progress Notes (Signed)
Careteam: Patient Care Team: Lauree Chandler, NP as PCP - General (Geriatric Medicine) Carol Ada, MD as Consulting Physician (Gastroenterology) Brunetta Genera, MD as Consulting Physician (Hematology) Elsie Saas, MD as Consulting Physician (Orthopedic Surgery)  PLACE OF SERVICE:  [place] Directive information    No Known Allergies  Chief Complaint  Patient presents with   Medical Management of Chronic Issues    6 month follow up.   Health Maintenance    Zoster, COVID booster, Eye exam HIV screening     HPI: Patient is a 65 y.o. male for routine follow up.   Not exercising as routinely. Reports legs "messed up" knees have been hurting him.  Went to orthopedic.   Had been working on diet but went out of town which got him off track.   Long standing hx of chest pains but they have been less.   Stress at work.   Osteoarthritis- followed by orthopedics, has had injections.   Low testosterone- reports he got a test and consult from a telephone doc. Recommended testosterone but he does not want to take due to side effects .  DM- does not have ophthalmologist, reports he plans to get on.  No hypoglycemia.    Review of Systems:  Review of Systems  Constitutional:  Negative for chills, fever and weight loss.  HENT:  Negative for tinnitus.   Respiratory:  Negative for cough, sputum production and shortness of breath.   Cardiovascular:  Negative for chest pain, palpitations and leg swelling.  Gastrointestinal:  Negative for abdominal pain, constipation, diarrhea and heartburn.  Genitourinary:  Negative for dysuria, frequency and urgency.  Musculoskeletal:  Positive for back pain and joint pain. Negative for falls and myalgias.  Skin: Negative.   Neurological:  Negative for dizziness and headaches.  Psychiatric/Behavioral:  Negative for depression and memory loss. The patient does not have insomnia.    Past Medical History:  Diagnosis Date    Chest pain    Per incoming records from [place]    Diabetes mellitus without complication ([place])    Epilepsy ([place])    Hypertension    Obesity, morbid, BMI 50 or higher ([place])    Per incoming records from [facility]    Type 2 diabetes mellitus [facility])    Past Surgical History:  Procedure Laterality Date   COLONOSCOPY WITH PROPOFOL N/A 11/01/2018   Procedure: COLONOSCOPY WITH PROPOFOL;  Surgeon: Carol Ada, MD;  [facility];  Service: Endoscopy;  Laterality: N/A;   KNEE ARTHROSCOPY Right 2005   Dr Shirlean Kelly   POLYPECTOMY  11/01/2018   Procedure: POLYPECTOMY;  Surgeon: Carol Ada, MD;  [facility];  Service: Endoscopy;;   Social History:   reports that he quit smoking about 15 years ago. He has never used smokeless tobacco. He reports previous alcohol use. He reports that he does not use drugs.  History reviewed. No pertinent family history.  Medications: Patient's Medications  New Prescriptions   No medications on file  Previous Medications   AMLODIPINE (NORVASC) 10 MG TABLET    Take 1 tablet by mouth once daily   ATORVASTATIN (LIPITOR) 20 MG TABLET    Take 1 tablet by mouth once daily   HYDROCHLOROTHIAZIDE (HYDRODIURIL) 25 MG TABLET    Take 1 tablet by mouth once daily   JARDIANCE 10 MG TABS TABLET    TAKE 1 TABLET BY MOUTH ONCE DAILY BEFORE BREAKFAST   LISINOPRIL (ZESTRIL) 10 MG TABLET    Take  1 tablet by mouth once daily   METFORMIN (GLUCOPHAGE) 1000 MG TABLET    TAKE 1 TABLET BY MOUTH TWICE DAILY WITH A MEAL   TADALAFIL (CIALIS) 10 MG TABLET    Take 1 tablet (10 mg total) by mouth daily as needed for erectile dysfunction.  Modified Medications   No medications on file  Discontinued Medications   No medications on file    Physical Exam:  Vitals:   09/15/20 1430  BP: 122/78  Pulse: 94  Temp: (!) 96.6 F (35.9 C)  Weight: (!) 342 lb 9.6 oz (155.4 kg)  Height: _0  (1.905 m)   Body mass index is 42.82 kg/m. Wt Readings from Last 3  Encounters:  09/15/20 (!) 342 lb 9.6 oz (155.4 kg)  06/15/20 (!) 340 lb (154.2 kg)  02/25/20 (!) 352 lb (159.7 kg)    Physical Exam Constitutional:      General: He is not in acute distress.    Appearance: He is well-developed. He is not diaphoretic.  HENT:     Head: Normocephalic and atraumatic.     Right Ear: External ear normal.     Left Ear: External ear normal.     Mouth/Throat:     Pharynx: No oropharyngeal exudate.  Eyes:     Conjunctiva/sclera: Conjunctivae normal.     Pupils: Pupils are equal, round, and reactive to light.  Cardiovascular:     Rate and Rhythm: Normal rate and regular rhythm.     Heart sounds: Normal heart sounds.  Pulmonary:     Effort: Pulmonary effort is normal.     Breath sounds: Normal breath sounds.  Abdominal:     General: Bowel sounds are normal.     Palpations: Abdomen is soft.  Musculoskeletal:        General: No tenderness.     Cervical back: Normal range of motion and neck supple.     Right lower leg: No edema.     Left lower leg: No edema.  Skin:    General: Skin is warm and dry.  Neurological:     Mental Status: He is alert and oriented to person, place, and time.  Psychiatric:        Mood and Affect: Mood normal.    Labs reviewed: Basic Metabolic Panel: Recent Labs    10/14/19 0815 02/17/20 0806 09/10/20 0819  NA 139 140 139  K 3.7 4.2 4.1  CL 105 104 102  CO2 _1 GLUCOSE 105* 93 98  BUN _2 CREATININE 0.93 0.98 0.93  CALCIUM 9.1 9.5 9.6   Liver Function Tests: Recent Labs    02/17/20 0806 09/10/20 0819  AST 16 15  ALT 9 7*  BILITOT 0.4 0.5  PROT 7.3 7.6   No results for input(s): LIPASE, AMYLASE in the last 8760 hours. No results for input(s): AMMONIA in the last 8760 hours. CBC: Recent Labs    02/17/20 0806 09/10/20 0819  WBC 8.5 8.3  NEUTROABS 3,689 3,171  HGB 12.2* 12.8*  HCT 41.2 41.8  MCV 76.9* 77.3*  PLT 238 261   Lipid Panel: Recent Labs    02/17/20 0806 09/10/20 0819   CHOL 123 116  HDL 44 41  LDLCALC 64 59  TRIG 72 77  CHOLHDL 2.8 2.8   TSH: No results for input(s): TSH in the last 8760 hours. A1C: Lab Results  Component Value Date   HGBA1C 6.3 (H) 09/10/2020     Assessment/Plan 1. Mixed hyperlipidemia -LDL  at goal. Continue to work on diet and exericse - atorvastatin (LIPITOR) 20 MG tablet; Take 1 tablet (20 mg total) by mouth daily.  Dispense: 90 tablet; Refill: 0 - CMP with eGFR([facility]); Future  2. Erectile dysfunction, unspecified erectile dysfunction type Ongoing, reports he had testosterone checked and it was low, does not want further treatment at this time.   3. Anemia, unspecified type Stable at this time.  - CBC with Differential/Platelet; Future  4. Morbid obesity ([place]) -education provided on healthy weight loss through increase in physical activity and proper nutrition   5. Essential hypertension Controlled on current regimen. To continue medication with dietary modifications.  - CMP with eGFR([facility]); Future - CBC with Differential/Platelet; Future  6. Arthritis Ongoing, encouraged weight loss to help knees, tylenol PRN. He is followed by orthopedics and has had injections in the past with good effects.  7. Diabetes mellitus due to underlying condition with diabetic autonomic neuropathy, without long-term current use of insulin (HCC) -stable A1c at this time. He has not followed up with an eye doctor. Offered referral but declines and he states he will make appt soon.  Encouraged dietary compliance, routine foot care/monitoring and to keep up with diabetic eye exams through ophthalmology  -will continue current medication regimen at this time. - Hemoglobin A1c; Future    Next appt: 6 months, labs prior Jamisyn Langer K. [place], [facility] [phone]

## 2020-11-08 ENCOUNTER — Other Ambulatory Visit: Payer: Self-pay | Admitting: Nurse Practitioner

## 2020-11-08 DIAGNOSIS — E0843 Diabetes mellitus due to underlying condition with diabetic autonomic (poly)neuropathy: Secondary | ICD-10-CM

## 2020-11-08 DIAGNOSIS — I10 Essential (primary) hypertension: Secondary | ICD-10-CM

## 2020-12-28 ENCOUNTER — Other Ambulatory Visit: Payer: Self-pay | Admitting: Nurse Practitioner

## 2020-12-28 DIAGNOSIS — E782 Mixed hyperlipidemia: Secondary | ICD-10-CM

## 2021-01-21 DIAGNOSIS — Z23 Encounter for immunization: Secondary | ICD-10-CM | POA: Diagnosis not present

## 2021-03-14 ENCOUNTER — Other Ambulatory Visit: Payer: BC Managed Care – PPO

## 2021-03-17 ENCOUNTER — Other Ambulatory Visit: Payer: BC Managed Care – PPO

## 2021-03-24 ENCOUNTER — Other Ambulatory Visit: Payer: Self-pay

## 2021-03-24 ENCOUNTER — Other Ambulatory Visit: Payer: BC Managed Care – PPO

## 2021-03-24 DIAGNOSIS — E782 Mixed hyperlipidemia: Secondary | ICD-10-CM | POA: Diagnosis not present

## 2021-03-24 DIAGNOSIS — D649 Anemia, unspecified: Secondary | ICD-10-CM | POA: Diagnosis not present

## 2021-03-24 DIAGNOSIS — E0843 Diabetes mellitus due to underlying condition with diabetic autonomic (poly)neuropathy: Secondary | ICD-10-CM | POA: Diagnosis not present

## 2021-03-24 DIAGNOSIS — I10 Essential (primary) hypertension: Secondary | ICD-10-CM | POA: Diagnosis not present

## 2021-03-25 LAB — CBC WITH DIFFERENTIAL/PLATELET
Absolute Monocytes: 361 cells/uL (ref 200–950)
Basophils Absolute: 33 cells/uL (ref 0–200)
Basophils Relative: 0.4 %
Eosinophils Absolute: 238 cells/uL (ref 15–500)
Eosinophils Relative: 2.9 %
HCT: 40.4 % (ref 38.5–50.0)
Hemoglobin: 12.4 g/dL — ABNORMAL LOW (ref 13.2–17.1)
Lymphs Abs: 4494 cells/uL — ABNORMAL HIGH (ref 850–3900)
MCH: 23.8 pg — ABNORMAL LOW (ref 27.0–33.0)
MCHC: 30.7 g/dL — ABNORMAL LOW (ref 32.0–36.0)
MCV: 77.4 fL — ABNORMAL LOW (ref 80.0–100.0)
MPV: 13 fL — ABNORMAL HIGH (ref 7.5–12.5)
Monocytes Relative: 4.4 %
Neutro Abs: 3075 cells/uL (ref 1500–7800)
Neutrophils Relative %: 37.5 %
Platelets: 236 10*3/uL (ref 140–400)
RBC: 5.22 10*6/uL (ref 4.20–5.80)
RDW: 14.2 % (ref 11.0–15.0)
Total Lymphocyte: 54.8 %
WBC: 8.2 10*3/uL (ref 3.8–10.8)

## 2021-03-25 LAB — COMPLETE METABOLIC PANEL WITH GFR
AG Ratio: 1.2 (calc) (ref 1.0–2.5)
ALT: 8 U/L — ABNORMAL LOW (ref 9–46)
AST: 15 U/L (ref 10–35)
Albumin: 4 g/dL (ref 3.6–5.1)
Alkaline phosphatase (APISO): 94 U/L (ref 35–144)
BUN: 15 mg/dL (ref 7–25)
CO2: 26 mmol/L (ref 20–32)
Calcium: 9.4 mg/dL (ref 8.6–10.3)
Chloride: 105 mmol/L (ref 98–110)
Creat: 0.97 mg/dL (ref 0.70–1.35)
Globulin: 3.3 g/dL (calc) (ref 1.9–3.7)
Glucose, Bld: 106 mg/dL — ABNORMAL HIGH (ref 65–99)
Potassium: 4 mmol/L (ref 3.5–5.3)
Sodium: 140 mmol/L (ref 135–146)
Total Bilirubin: 0.4 mg/dL (ref 0.2–1.2)
Total Protein: 7.3 g/dL (ref 6.1–8.1)
eGFR: 87 mL/min/{1.73_m2} (ref >=60)

## 2021-03-25 LAB — HEMOGLOBIN A1C
Hgb A1c MFr Bld: 6.6 % of total Hgb — ABNORMAL HIGH (ref <5.7)
Mean Plasma Glucose: 143 mg/dL
eAG (mmol/L): 7.9 mmol/L

## 2021-03-31 ENCOUNTER — Other Ambulatory Visit: Payer: BC Managed Care – PPO

## 2021-04-08 ENCOUNTER — Encounter: Payer: Self-pay | Admitting: Nurse Practitioner

## 2021-04-08 ENCOUNTER — Other Ambulatory Visit: Payer: Self-pay

## 2021-04-08 ENCOUNTER — Ambulatory Visit: Payer: BC Managed Care – PPO | Admitting: Nurse Practitioner

## 2021-04-08 VITALS — BP 122/78 | HR 83 | Temp 97.1°F | Ht 75.0 in | Wt 345.0 lb

## 2021-04-08 DIAGNOSIS — H548 Legal blindness, as defined in USA: Secondary | ICD-10-CM

## 2021-04-08 DIAGNOSIS — E0843 Diabetes mellitus due to underlying condition with diabetic autonomic (poly)neuropathy: Secondary | ICD-10-CM | POA: Diagnosis not present

## 2021-04-08 DIAGNOSIS — I10 Essential (primary) hypertension: Secondary | ICD-10-CM | POA: Diagnosis not present

## 2021-04-08 DIAGNOSIS — E782 Mixed hyperlipidemia: Secondary | ICD-10-CM

## 2021-04-08 DIAGNOSIS — D649 Anemia, unspecified: Secondary | ICD-10-CM | POA: Diagnosis not present

## 2021-04-08 DIAGNOSIS — Q845 Enlarged and hypertrophic nails: Secondary | ICD-10-CM

## 2021-04-08 NOTE — Patient Instructions (Signed)
Tylenol 500 mg 1-2 tablets every 8 hours as needed pain.   Follow up with orthopedics for knee

## 2021-04-08 NOTE — Progress Notes (Signed)
Careteam: Patient Care Team: Lauree Chandler, NP as PCP - General (Geriatric Medicine) Carol Ada, MD as Consulting Physician (Gastroenterology) Brunetta Genera, MD as Consulting Physician (Hematology) Elsie Saas, MD as Consulting Physician (Orthopedic Surgery)  PLACE OF SERVICE:  [place] Directive information Does Patient Have a Medical Advance Directive?: Yes, Type of Advance Directive: [place], Does patient want to make changes to medical advance directive?: No - Patient declined  No Known Allergies  Chief Complaint  Patient presents with   Medical Management of Chronic Issues    6 month follow-up, discuss labs from 03/24/21 (copy given to patient). Discuss need for eye exam, HIV screening, covid boosters, and flu vaccine or post pone if patient refuses (if vaccines are up to date patient needs to provide documentation). Foot exam today. Discuss knee/leg stiffness.      HPI: Patient is a 66 y.o. male for routine follow up.   DM- A1c at goal 6.6 (forgets to take his PM metformin), continues on jardiance   HTN- well controlled on norvasc and hctz with lisinopril.   Anemia- stable.   Does not get eye exam "blind and I am going to stay blind" "nothing going to change"   Up to date on covid booster  OA knee- has had 2 procedures on right knee. Has orthopedic but has not followed up recently.  Review of Systems:  Review of Systems  Constitutional:  Negative for chills, fever and weight loss.  HENT:  Negative for tinnitus.   Respiratory:  Negative for cough, sputum production and shortness of breath.   Cardiovascular:  Negative for chest pain, palpitations and leg swelling.  Gastrointestinal:  Negative for abdominal pain, constipation, diarrhea and heartburn.  Genitourinary:  Negative for dysuria, frequency and urgency.  Musculoskeletal:  Positive for joint pain. Negative for back pain, falls and myalgias.  Skin: Negative.    Neurological:  Negative for dizziness and headaches.  Psychiatric/Behavioral:  Negative for depression and memory loss. The patient does not have insomnia.    Past Medical History:  Diagnosis Date   Chest pain    Per incoming records from [place]    Diabetes mellitus without complication ([place])    Epilepsy ([place])    Hypertension    Obesity, morbid, BMI 50 or higher ([place])    Per incoming records from [facility]    Type 2 diabetes mellitus [facility])    Past Surgical History:  Procedure Laterality Date   COLONOSCOPY WITH PROPOFOL N/A 11/01/2018   Procedure: COLONOSCOPY WITH PROPOFOL;  Surgeon: Carol Ada, MD;  [facility];  Service: Endoscopy;  Laterality: N/A;   KNEE ARTHROSCOPY Right 2005   Dr Shirlean Kelly   POLYPECTOMY  11/01/2018   Procedure: POLYPECTOMY;  Surgeon: Carol Ada, MD;  [facility];  Service: Endoscopy;;   Social History:   reports that he quit smoking about 16 years ago. His smoking use included cigarettes. He has never used smokeless tobacco. He reports that he does not currently use alcohol. He reports that he does not use drugs.  History reviewed. No pertinent family history.  Medications: Patient's Medications  New Prescriptions   No medications on file  Previous Medications   AMLODIPINE (NORVASC) 10 MG TABLET    Take 1 tablet by mouth once daily   ATORVASTATIN (LIPITOR) 20 MG TABLET    Take 1 tablet by mouth once daily   EMPAGLIFLOZIN (JARDIANCE) 10 MG TABS TABLET    TAKE 1 TABLET BY  MOUTH ONCE DAILY BEFORE BREAKFAST   HYDROCHLOROTHIAZIDE (HYDRODIURIL) 25 MG TABLET    Take 1 tablet by mouth once daily   LISINOPRIL (ZESTRIL) 10 MG TABLET    Take 1 tablet by mouth once daily   METFORMIN (GLUCOPHAGE) 1000 MG TABLET    TAKE 1 TABLET BY MOUTH TWICE DAILY WITH A MEAL   TADALAFIL (CIALIS) 10 MG TABLET    Take 1 tablet (10 mg total) by mouth daily as needed for erectile dysfunction.  Modified Medications   No medications on file   Discontinued Medications   No medications on file    Physical Exam:  Vitals:   04/08/21 1451  BP: 122/78  Pulse: 83  Temp: (!) 97.1 F (36.2 C)  TempSrc: Temporal  SpO2: 97%  Weight: (!) 345 lb (156.5 kg)  Height: 6' 3"  (1.905 m)   Body mass index is 43.12 kg/m. Wt Readings from Last 3 Encounters:  04/08/21 (!) 345 lb (156.5 kg)  09/15/20 (!) 342 lb 9.6 oz (155.4 kg)  06/15/20 (!) 340 lb (154.2 kg)    Physical Exam Constitutional:      General: He is not in acute distress.    Appearance: He is well-developed. He is obese. He is not diaphoretic.  HENT:     Head: Normocephalic and atraumatic.     Right Ear: External ear normal.     Left Ear: External ear normal.     Mouth/Throat:     Pharynx: No oropharyngeal exudate.  Eyes:     Conjunctiva/sclera: Conjunctivae normal.     Pupils: Pupils are equal, round, and reactive to light.  Cardiovascular:     Rate and Rhythm: Normal rate and regular rhythm.     Heart sounds: Normal heart sounds.  Pulmonary:     Effort: Pulmonary effort is normal.     Breath sounds: Normal breath sounds.  Abdominal:     General: Bowel sounds are normal.     Palpations: Abdomen is soft.  Musculoskeletal:        General: No tenderness.     Cervical back: Normal range of motion and neck supple.     Right lower leg: No edema.     Left lower leg: No edema.  Skin:    General: Skin is warm and dry.  Neurological:     Mental Status: He is alert and oriented to person, place, and time.    Labs reviewed: Basic Metabolic Panel: Recent Labs    09/10/20 0819 03/24/21 0816  NA 139 140  K 4.1 4.0  CL 102 105  CO2 27 26  GLUCOSE 98 106*  BUN 12 15  CREATININE 0.93 0.97  CALCIUM 9.6 9.4   Liver Function Tests: Recent Labs    09/10/20 0819 03/24/21 0816  AST 15 15  ALT 7* 8*  BILITOT 0.5 0.4  PROT 7.6 7.3   No results for input(s): LIPASE, AMYLASE in the last 8760 hours. No results for input(s): AMMONIA in the last 8760  hours. CBC: Recent Labs    09/10/20 0819 03/24/21 0816  WBC 8.3 8.2  NEUTROABS 3,171 3,075  HGB 12.8* 12.4*  HCT 41.8 40.4  MCV 77.3* 77.4*  PLT 261 236   Lipid Panel: Recent Labs    09/10/20 0819  CHOL 116  HDL 41  LDLCALC 59  TRIG 77  CHOLHDL 2.8   TSH: No results for input(s): TSH in the last 8760 hours. A1C: Lab Results  Component Value Date   HGBA1C 6.6 (H) 03/24/2021  Assessment/Plan 1. Anemia, unspecified type -hgb stable.   2. Mixed hyperlipidemia -continues on lipitor with dietary modifications.  - Lipid Panel; Future - CMP with eGFR([facility]); Future  3. Essential hypertension -stable. Goal bp <140/90. Continue on current regimen with low sodium diet.  - CBC with Differential/Platelet; Future  4. Diabetes mellitus due to underlying condition with diabetic autonomic neuropathy, without long-term current use of insulin (HCC) -a1c at goal. Encouraged dietary and medication compliance, routine foot care/monitoring and to keep up with diabetic eye exams through ophthalmology.  - Hemoglobin A1c; Future  5. Morbid obesity ([place]) -education provided on healthy weight loss through increase in physical activity and proper nutrition   6. Legally blind -does not wish to have any additional ophthalmology visit due to progressive optic nerve disease.   7. Enlarged and hypertrophic nails Toenails trimmed to bilateral feet due to enlarged long toenails. Pt tolerated well.     Next appt: 6 months, labs prior  Amazing Cowman K. [place], [facility] [phone]

## 2021-04-26 ENCOUNTER — Other Ambulatory Visit: Payer: Self-pay | Admitting: Nurse Practitioner

## 2021-04-26 DIAGNOSIS — E0843 Diabetes mellitus due to underlying condition with diabetic autonomic (poly)neuropathy: Secondary | ICD-10-CM

## 2021-05-09 ENCOUNTER — Other Ambulatory Visit: Payer: Self-pay | Admitting: Nurse Practitioner

## 2021-05-11 ENCOUNTER — Other Ambulatory Visit: Payer: Self-pay | Admitting: Nurse Practitioner

## 2021-05-11 DIAGNOSIS — E782 Mixed hyperlipidemia: Secondary | ICD-10-CM

## 2021-07-11 ENCOUNTER — Other Ambulatory Visit: Payer: Self-pay | Admitting: Nurse Practitioner

## 2021-07-11 DIAGNOSIS — E0843 Diabetes mellitus due to underlying condition with diabetic autonomic (poly)neuropathy: Secondary | ICD-10-CM

## 2021-07-11 DIAGNOSIS — I10 Essential (primary) hypertension: Secondary | ICD-10-CM

## 2021-09-13 DIAGNOSIS — M170 Bilateral primary osteoarthritis of knee: Secondary | ICD-10-CM | POA: Diagnosis not present

## 2021-10-03 ENCOUNTER — Other Ambulatory Visit: Payer: BC Managed Care – PPO

## 2021-10-03 DIAGNOSIS — I10 Essential (primary) hypertension: Secondary | ICD-10-CM

## 2021-10-03 DIAGNOSIS — E0843 Diabetes mellitus due to underlying condition with diabetic autonomic (poly)neuropathy: Secondary | ICD-10-CM | POA: Diagnosis not present

## 2021-10-03 DIAGNOSIS — E782 Mixed hyperlipidemia: Secondary | ICD-10-CM

## 2021-10-04 LAB — COMPLETE METABOLIC PANEL WITH GFR
AG Ratio: 1.2 (calc) (ref 1.0–2.5)
ALT: 7 U/L — ABNORMAL LOW (ref 9–46)
AST: 14 U/L (ref 10–35)
Albumin: 3.9 g/dL (ref 3.6–5.1)
Alkaline phosphatase (APISO): 104 U/L (ref 35–144)
BUN: 21 mg/dL (ref 7–25)
CO2: 27 mmol/L (ref 20–32)
Calcium: 9.4 mg/dL (ref 8.6–10.3)
Chloride: 103 mmol/L (ref 98–110)
Creat: 1.09 mg/dL (ref 0.70–1.35)
Globulin: 3.2 g/dL (calc) (ref 1.9–3.7)
Glucose, Bld: 90 mg/dL (ref 65–99)
Potassium: 4.2 mmol/L (ref 3.5–5.3)
Sodium: 139 mmol/L (ref 135–146)
Total Bilirubin: 0.4 mg/dL (ref 0.2–1.2)
Total Protein: 7.1 g/dL (ref 6.1–8.1)
eGFR: 76 mL/min/{1.73_m2} (ref >=60)

## 2021-10-04 LAB — CBC WITH DIFFERENTIAL/PLATELET
Absolute Monocytes: 500 cells/uL (ref 200–950)
Basophils Absolute: 49 cells/uL (ref 0–200)
Basophils Relative: 0.4 %
Eosinophils Absolute: 49 cells/uL (ref 15–500)
Eosinophils Relative: 0.4 %
HCT: 42.1 % (ref 38.5–50.0)
Hemoglobin: 13 g/dL — ABNORMAL LOW (ref 13.2–17.1)
Lymphs Abs: 5222 cells/uL — ABNORMAL HIGH (ref 850–3900)
MCH: 23.8 pg — ABNORMAL LOW (ref 27.0–33.0)
MCHC: 30.9 g/dL — ABNORMAL LOW (ref 32.0–36.0)
MCV: 77.1 fL — ABNORMAL LOW (ref 80.0–100.0)
MPV: 12 fL (ref 7.5–12.5)
Monocytes Relative: 4.1 %
Neutro Abs: 6381 cells/uL (ref 1500–7800)
Neutrophils Relative %: 52.3 %
Platelets: 258 10*3/uL (ref 140–400)
RBC: 5.46 10*6/uL (ref 4.20–5.80)
RDW: 14.5 % (ref 11.0–15.0)
Total Lymphocyte: 42.8 %
WBC: 12.2 10*3/uL — ABNORMAL HIGH (ref 3.8–10.8)

## 2021-10-04 LAB — HEMOGLOBIN A1C
Hgb A1c MFr Bld: 6.4 % of total Hgb — ABNORMAL HIGH (ref <5.7)
Mean Plasma Glucose: 137 mg/dL
eAG (mmol/L): 7.6 mmol/L

## 2021-10-04 LAB — LIPID PANEL
Cholesterol: 118 mg/dL (ref <200)
HDL: 55 mg/dL (ref >=40)
LDL Cholesterol (Calc): 52 mg/dL (calc)
Non-HDL Cholesterol (Calc): 63 mg/dL (calc) (ref <130)
Total CHOL/HDL Ratio: 2.1 (calc) (ref <5.0)
Triglycerides: 37 mg/dL (ref <150)

## 2021-10-07 ENCOUNTER — Ambulatory Visit: Payer: BC Managed Care – PPO | Admitting: Nurse Practitioner

## 2021-10-07 ENCOUNTER — Encounter: Payer: Self-pay | Admitting: Nurse Practitioner

## 2021-10-07 VITALS — BP 130/82 | HR 91 | Temp 97.5°F | Ht 75.0 in | Wt 336.8 lb

## 2021-10-07 DIAGNOSIS — D649 Anemia, unspecified: Secondary | ICD-10-CM

## 2021-10-07 DIAGNOSIS — I10 Essential (primary) hypertension: Secondary | ICD-10-CM

## 2021-10-07 DIAGNOSIS — E0843 Diabetes mellitus due to underlying condition with diabetic autonomic (poly)neuropathy: Secondary | ICD-10-CM

## 2021-10-07 DIAGNOSIS — E782 Mixed hyperlipidemia: Secondary | ICD-10-CM | POA: Diagnosis not present

## 2021-10-07 DIAGNOSIS — M170 Bilateral primary osteoarthritis of knee: Secondary | ICD-10-CM

## 2021-10-07 NOTE — Progress Notes (Signed)
Careteam: Patient Care Team: Lauree Chandler, NP as PCP - General (Geriatric Medicine) Carol Ada, MD as Consulting Physician (Gastroenterology) Brunetta Genera, MD as Consulting Physician (Hematology) Elsie Saas, MD as Consulting Physician (Orthopedic Surgery)  PLACE OF SERVICE:  [place] Directive information    No Known Allergies  Chief Complaint  Patient presents with   Medical Management of Chronic Issues    Patient presents today for a 6 month follow-up.     HPI: Patient is a 66 y.o. male for routine follow up.   DM- A1c controlled on metformin and jardiance  Htn- controlled on norvasc, hctz and lisinopril   Hyperlipidemia- at goal on lipitor.   June is a busy month for him, has not been exercising routinely but plans to get back   Reports he needs both knees replaced but has to get BMI down. Reports needs to lose 25 more lbs.  He had both knees injected at last appt.    Review of Systems:  Review of Systems  Constitutional:  Negative for chills, fever and weight loss.  HENT:  Negative for tinnitus.   Eyes:        Blind  Respiratory:  Negative for cough, sputum production and shortness of breath.   Cardiovascular:  Negative for chest pain, palpitations and leg swelling.  Gastrointestinal:  Negative for abdominal pain, constipation, diarrhea and heartburn.  Genitourinary:  Negative for dysuria, frequency and urgency.  Musculoskeletal:  Positive for joint pain (both knees with pain). Negative for back pain, falls and myalgias.  Skin: Negative.   Neurological:  Negative for dizziness and headaches.  Psychiatric/Behavioral:  Negative for depression and memory loss. The patient does not have insomnia.     Past Medical History:  Diagnosis Date   Chest pain    Per incoming records from [place]    Diabetes mellitus without complication ([place])    Epilepsy ([place])    Hypertension    Obesity, morbid, BMI 50 or higher ([place])     [facility] from [facility]    Type 2 diabetes mellitus [facility])    Past Surgical History:  Procedure Laterality Date   COLONOSCOPY WITH PROPOFOL N/A 11/01/2018   Procedure: COLONOSCOPY WITH PROPOFOL;  Surgeon: Carol Ada, MD;  [facility];  Service: Endoscopy;  Laterality: N/A;   KNEE ARTHROSCOPY Right 2005   Dr Shirlean Kelly   POLYPECTOMY  11/01/2018   Procedure: POLYPECTOMY;  Surgeon: Carol Ada, MD;  [facility];  Service: Endoscopy;;   Social History:   reports that he quit smoking about 16 years ago. His smoking use included cigarettes. He has never used smokeless tobacco. He reports that he does not currently use alcohol. He reports that he does not use drugs.  History reviewed. No pertinent family history.  Medications: Patient's Medications  New Prescriptions   No medications on file  Previous Medications   AMLODIPINE (NORVASC) 10 MG TABLET    Take 1 tablet by mouth once daily   ATORVASTATIN (LIPITOR) 20 MG TABLET    Take 1 tablet by mouth once daily   EMPAGLIFLOZIN (JARDIANCE) 10 MG TABS TABLET    TAKE 1 TABLET BY MOUTH ONCE DAILY BEFORE BREAKFAST   HYDROCHLOROTHIAZIDE (HYDRODIURIL) 25 MG TABLET    Take 1 tablet by mouth once daily   LISINOPRIL (ZESTRIL) 10 MG TABLET    Take 1 tablet by mouth once daily   METFORMIN (GLUCOPHAGE) 1000 MG TABLET    TAKE 1 TABLET BY  MOUTH TWICE DAILY WITH A MEAL  Modified Medications   No medications on file  Discontinued Medications   TADALAFIL (CIALIS) 10 MG TABLET    Take 1 tablet (10 mg total) by mouth daily as needed for erectile dysfunction.    Physical Exam:  Vitals:   10/07/21 1520  BP: 130/82  Pulse: 91  Temp: (!) 97.5 F (36.4 C)  SpO2: 97%  Weight: (!) 336 lb 12.8 oz (152.8 kg)  Height: _0  (1.905 m)   Body mass index is 42.1 kg/m. Wt Readings from Last 3 Encounters:  10/07/21 (!) 336 lb 12.8 oz (152.8 kg)  04/08/21 (!) 345 lb (156.5 kg)  09/15/20 (!) 342 lb 9.6 oz (155.4 kg)     Physical Exam Constitutional:      General: He is not in acute distress.    Appearance: He is well-developed. He is not diaphoretic.  HENT:     Head: Normocephalic and atraumatic.     Right Ear: External ear normal.     Left Ear: External ear normal.     Mouth/Throat:     Pharynx: No oropharyngeal exudate.  Eyes:     Conjunctiva/sclera: Conjunctivae normal.     Pupils: Pupils are equal, round, and reactive to light.  Cardiovascular:     Rate and Rhythm: Normal rate and regular rhythm.     Heart sounds: Normal heart sounds.  Pulmonary:     Effort: Pulmonary effort is normal.     Breath sounds: Normal breath sounds.  Abdominal:     General: Bowel sounds are normal.     Palpations: Abdomen is soft.  Musculoskeletal:        General: No tenderness.     Cervical back: Normal range of motion and neck supple.     Right lower leg: No edema.     Left lower leg: No edema.  Skin:    General: Skin is warm and dry.  Neurological:     Mental Status: He is alert and oriented to person, place, and time.     Labs reviewed: Basic Metabolic Panel: Recent Labs    03/24/21 0816 10/03/21 0803  NA 140 139  K 4.0 4.2  CL 105 103  CO2 26 27  GLUCOSE 106* 90  BUN 15 21  CREATININE 0.97 1.09  CALCIUM 9.4 9.4   Liver Function Tests: Recent Labs    03/24/21 0816 10/03/21 0803  AST 15 14  ALT 8* 7*  BILITOT 0.4 0.4  PROT 7.3 7.1   No results for input(s): "LIPASE", "AMYLASE" in the last 8760 hours. No results for input(s): "AMMONIA" in the last 8760 hours. CBC: Recent Labs    03/24/21 0816 10/03/21 0803  WBC 8.2 12.2*  NEUTROABS 3,075 6,381  HGB 12.4* 13.0*  HCT 40.4 42.1  MCV 77.4* 77.1*  PLT 236 258   Lipid Panel: Recent Labs    10/03/21 0803  CHOL 118  HDL 55  LDLCALC 52  TRIG 37  CHOLHDL 2.1   TSH: No results for input(s): "TSH" in the last 8760 hours. A1C: Lab Results  Component Value Date   HGBA1C 6.4 (H) 10/03/2021     Assessment/Plan 1.  Essential hypertension -Blood pressure well controlled Continue current medications Follow  metabolic panel  2. Diabetes mellitus due to underlying condition with diabetic autonomic neuropathy, without long-term current use of insulin ([place]) -Encouraged dietary compliance, routine foot care/monitoring and to keep up with diabetic eye exams through ophthalmology  -continue metfomrin twice daily with  jardiance - Hemoglobin A1c; Future  3. Morbid obesity ([place]) --continues to work on weight loss, education provided on healthy weight loss through increase in physical activity and proper nutrition   4. Mixed hyperlipidemia -cholesterol at goal. Continue current regimen. - CBC with Differential/Platelet; Future - CMP with eGFR([facility]); Future  5. Anemia, unspecified type -hgb remains stable. No signs of blood loss.   6. Bilateral primary osteoarthritis of knee --recent bilateral knee injections, plans to have surgery once he gets BMI down.    Return in about 6 months (around 04/09/2022) for routine follow up, labs prior to appt. Carlos American. [place], [facility] [phone]

## 2021-10-07 NOTE — Patient Instructions (Signed)
To use Claritin or zyrtec 10 mg by mouth daily as needed for nasal congestion/ear fullness.

## 2021-12-12 ENCOUNTER — Other Ambulatory Visit: Payer: Self-pay | Admitting: Nurse Practitioner

## 2021-12-12 DIAGNOSIS — E782 Mixed hyperlipidemia: Secondary | ICD-10-CM

## 2021-12-13 DIAGNOSIS — M170 Bilateral primary osteoarthritis of knee: Secondary | ICD-10-CM | POA: Diagnosis not present

## 2022-02-07 ENCOUNTER — Other Ambulatory Visit: Payer: Self-pay | Admitting: Nurse Practitioner

## 2022-02-07 DIAGNOSIS — E0843 Diabetes mellitus due to underlying condition with diabetic autonomic (poly)neuropathy: Secondary | ICD-10-CM

## 2022-03-13 ENCOUNTER — Telehealth: Payer: Self-pay

## 2022-03-13 DIAGNOSIS — E782 Mixed hyperlipidemia: Secondary | ICD-10-CM

## 2022-03-13 MED ORDER — ATORVASTATIN CALCIUM 20 MG PO TABS: 20.0000 mg | ORAL_TABLET | Freq: Every day | ORAL | 1 refills | Status: DC

## 2022-03-13 NOTE — Telephone Encounter (Signed)
Pharmacy send a rx for Lipitor 20 mg and medication was send into pharmacy.

## 2022-03-14 DIAGNOSIS — M170 Bilateral primary osteoarthritis of knee: Secondary | ICD-10-CM | POA: Diagnosis not present

## 2022-03-24 ENCOUNTER — Other Ambulatory Visit: Payer: Self-pay

## 2022-03-24 DIAGNOSIS — E782 Mixed hyperlipidemia: Secondary | ICD-10-CM

## 2022-03-24 DIAGNOSIS — E0843 Diabetes mellitus due to underlying condition with diabetic autonomic (poly)neuropathy: Secondary | ICD-10-CM

## 2022-03-29 ENCOUNTER — Other Ambulatory Visit: Payer: Self-pay | Admitting: Nurse Practitioner

## 2022-04-10 ENCOUNTER — Other Ambulatory Visit: Payer: Medicare Other

## 2022-04-10 DIAGNOSIS — E782 Mixed hyperlipidemia: Secondary | ICD-10-CM | POA: Diagnosis not present

## 2022-04-10 DIAGNOSIS — E0843 Diabetes mellitus due to underlying condition with diabetic autonomic (poly)neuropathy: Secondary | ICD-10-CM | POA: Diagnosis not present

## 2022-04-11 LAB — CBC WITH DIFFERENTIAL/PLATELET
Absolute Monocytes: 465 cells/uL (ref 200–950)
Basophils Absolute: 40 cells/uL (ref 0–200)
Basophils Relative: 0.4 %
Eosinophils Absolute: 109 cells/uL (ref 15–500)
Eosinophils Relative: 1.1 %
HCT: 40.3 % (ref 38.5–50.0)
Hemoglobin: 12.6 g/dL — ABNORMAL LOW (ref 13.2–17.1)
Lymphs Abs: 4623 cells/uL — ABNORMAL HIGH (ref 850–3900)
MCH: 23.7 pg — ABNORMAL LOW (ref 27.0–33.0)
MCHC: 31.3 g/dL — ABNORMAL LOW (ref 32.0–36.0)
MCV: 75.9 fL — ABNORMAL LOW (ref 80.0–100.0)
MPV: 12.3 fL (ref 7.5–12.5)
Monocytes Relative: 4.7 %
Neutro Abs: 4663 cells/uL (ref 1500–7800)
Neutrophils Relative %: 47.1 %
Platelets: 248 10*3/uL (ref 140–400)
RBC: 5.31 10*6/uL (ref 4.20–5.80)
RDW: 13.5 % (ref 11.0–15.0)
Total Lymphocyte: 46.7 %
WBC: 9.9 10*3/uL (ref 3.8–10.8)

## 2022-04-11 LAB — COMPLETE METABOLIC PANEL WITH GFR
AG Ratio: 1.1 (calc) (ref 1.0–2.5)
ALT: 5 U/L — ABNORMAL LOW (ref 9–46)
AST: 12 U/L (ref 10–35)
Albumin: 4 g/dL (ref 3.6–5.1)
Alkaline phosphatase (APISO): 115 U/L (ref 35–144)
BUN: 20 mg/dL (ref 7–25)
CO2: 29 mmol/L (ref 20–32)
Calcium: 9.4 mg/dL (ref 8.6–10.3)
Chloride: 103 mmol/L (ref 98–110)
Creat: 1.07 mg/dL (ref 0.70–1.35)
Globulin: 3.5 g/dL (calc) (ref 1.9–3.7)
Glucose, Bld: 92 mg/dL (ref 65–99)
Potassium: 4.2 mmol/L (ref 3.5–5.3)
Sodium: 142 mmol/L (ref 135–146)
Total Bilirubin: 0.4 mg/dL (ref 0.2–1.2)
Total Protein: 7.5 g/dL (ref 6.1–8.1)
eGFR: 77 mL/min/{1.73_m2} (ref >=60)

## 2022-04-11 LAB — HEMOGLOBIN A1C
Hgb A1c MFr Bld: 6.8 % of total Hgb — ABNORMAL HIGH (ref <5.7)
Mean Plasma Glucose: 148 mg/dL
eAG (mmol/L): 8.2 mmol/L

## 2022-04-14 ENCOUNTER — Ambulatory Visit: Payer: BC Managed Care – PPO | Admitting: Nurse Practitioner

## 2022-04-17 ENCOUNTER — Ambulatory Visit (INDEPENDENT_AMBULATORY_CARE_PROVIDER_SITE_OTHER): Payer: Medicare Other | Admitting: Nurse Practitioner

## 2022-04-17 ENCOUNTER — Encounter: Payer: Self-pay | Admitting: Nurse Practitioner

## 2022-04-17 VITALS — BP 130/78 | HR 61 | Temp 97.8°F | Resp 16 | Ht 75.0 in | Wt 337.0 lb

## 2022-04-17 DIAGNOSIS — D649 Anemia, unspecified: Secondary | ICD-10-CM

## 2022-04-17 DIAGNOSIS — E0843 Diabetes mellitus due to underlying condition with diabetic autonomic (poly)neuropathy: Secondary | ICD-10-CM

## 2022-04-17 DIAGNOSIS — M1990 Unspecified osteoarthritis, unspecified site: Secondary | ICD-10-CM

## 2022-04-17 DIAGNOSIS — I10 Essential (primary) hypertension: Secondary | ICD-10-CM

## 2022-04-17 DIAGNOSIS — G40909 Epilepsy, unspecified, not intractable, without status epilepticus: Secondary | ICD-10-CM

## 2022-04-17 DIAGNOSIS — E782 Mixed hyperlipidemia: Secondary | ICD-10-CM

## 2022-04-17 NOTE — Progress Notes (Signed)
Careteam: Patient Care Team: Lauree Chandler, NP as PCP - General (Geriatric Medicine) Carol Ada, MD as Consulting Physician (Gastroenterology) Brunetta Genera, MD as Consulting Physician (Hematology) Elsie Saas, MD as Consulting Physician (Orthopedic Surgery)  PLACE OF SERVICE:  [place] Directive information Does Patient Have a Medical Advance Directive?: Yes, Type of Advance Directive: [place];Living will, Does patient want to make changes to medical advance directive?: No - Patient declined  No Known Allergies  Chief Complaint  Patient presents with   Medical Management of Chronic Issues    6 month follow up   Immunizations    Discussed the need for flu, covid and pneumonia     HPI: Patient is a 67 y.o. male for routine follow up  Got flu and covid vaccine through his job.   Holidays have limited weight loss. He is going to gym after he leaves appt.   He is planning to have knee surgery in July. Recently had knee injection.  Plans to get left total knee then right total knee in September.   Last seizure in 1971- was on medication for 4-5 years but has not been on since and no more seizures.   Review of Systems:  Review of Systems  Constitutional:  Negative for chills, fever and weight loss.  HENT:  Negative for tinnitus.   Respiratory:  Negative for cough, sputum production and shortness of breath.   Cardiovascular:  Negative for chest pain, palpitations and leg swelling.  Gastrointestinal:  Negative for abdominal pain, constipation, diarrhea and heartburn.  Genitourinary:  Negative for dysuria, frequency and urgency.  Musculoskeletal:  Positive for joint pain. Negative for back pain, falls and myalgias.  Skin: Negative.   Neurological:  Negative for dizziness and headaches.  Psychiatric/Behavioral:  Negative for depression and memory loss. The patient does not have insomnia.     Past Medical History:   Diagnosis Date   Chest pain    Per incoming records from [place]    Diabetes mellitus without complication ([place])    Epilepsy ([place])    Hypertension    Obesity, morbid, BMI 50 or higher ([place])    Per incoming records from [facility]    Type 2 diabetes mellitus [facility])    Past Surgical History:  Procedure Laterality Date   COLONOSCOPY WITH PROPOFOL N/A 11/01/2018   Procedure: COLONOSCOPY WITH PROPOFOL;  Surgeon: Carol Ada, MD;  Location: [facility];  Service: Endoscopy;  Laterality: N/A;   KNEE ARTHROSCOPY Right 2005   Dr Shirlean Kelly   POLYPECTOMY  11/01/2018   Procedure: POLYPECTOMY;  Surgeon: Carol Ada, MD;  Location: [facility];  Service: Endoscopy;;   Social History:   reports that he quit smoking about 17 years ago. His smoking use included cigarettes. He has never used smokeless tobacco. He reports that he does not currently use alcohol. He reports that he does not use drugs.  History reviewed. No pertinent family history.  Medications: Patient's Medications  New Prescriptions   No medications on file  Previous Medications   AMLODIPINE (NORVASC) 10 MG TABLET    Take 1 tablet by mouth once daily   ATORVASTATIN (LIPITOR) 20 MG TABLET    Take 1 tablet (20 mg total) by mouth daily.   EMPAGLIFLOZIN (JARDIANCE) 10 MG TABS TABLET    TAKE 1 TABLET BY MOUTH ONCE DAILY BEFORE BREAKFAST   HYDROCHLOROTHIAZIDE (HYDRODIURIL) 25 MG TABLET    Take 1 tablet by mouth once daily   LISINOPRIL (  ZESTRIL) 10 MG TABLET    Take 1 tablet by mouth once daily   METFORMIN (GLUCOPHAGE) 1000 MG TABLET    TAKE 1 TABLET BY MOUTH TWICE DAILY WITH A MEAL  Modified Medications   No medications on file  Discontinued Medications   No medications on file    Physical Exam:  Vitals:   04/17/22 1435  BP: 130/78  Pulse: 61  Resp: 16  Temp: 97.8 F (36.6 C)  TempSrc: Temporal  SpO2: 97%  Weight: (!) 337 lb (152.9 kg)  Height: '6\' 3"'$  (1.905 m)   Body mass index is 42.12 kg/m. Wt Readings  from Last 3 Encounters:  04/17/22 (!) 337 lb (152.9 kg)  10/07/21 (!) 336 lb 12.8 oz (152.8 kg)  04/08/21 (!) 345 lb (156.5 kg)    Physical Exam Constitutional:      General: He is not in acute distress.    Appearance: He is well-developed. He is not diaphoretic.  HENT:     Head: Normocephalic and atraumatic.     Right Ear: External ear normal.     Left Ear: External ear normal.     Mouth/Throat:     Pharynx: No oropharyngeal exudate.  Eyes:     Conjunctiva/sclera: Conjunctivae normal.     Pupils: Pupils are equal, round, and reactive to light.  Cardiovascular:     Rate and Rhythm: Normal rate and regular rhythm.     Heart sounds: Normal heart sounds.  Pulmonary:     Effort: Pulmonary effort is normal.     Breath sounds: Normal breath sounds.  Abdominal:     General: Bowel sounds are normal.     Palpations: Abdomen is soft.  Musculoskeletal:        General: No tenderness.     Cervical back: Normal range of motion and neck supple.     Right lower leg: No edema.     Left lower leg: No edema.  Skin:    General: Skin is warm and dry.  Neurological:     Mental Status: He is alert and oriented to person, place, and time.     Labs reviewed: Basic Metabolic Panel: Recent Labs    10/03/21 0803 04/10/22 0857  NA 139 142  K 4.2 4.2  CL 103 103  CO2 27 29  GLUCOSE 90 92  BUN 21 20  CREATININE 1.09 1.07  CALCIUM 9.4 9.4   Liver Function Tests: Recent Labs    10/03/21 0803 04/10/22 0857  AST 14 12  ALT 7* 5*  BILITOT 0.4 0.4  PROT 7.1 7.5   No results for input(s): "LIPASE", "AMYLASE" in the last 8760 hours. No results for input(s): "AMMONIA" in the last 8760 hours. CBC: Recent Labs    10/03/21 0803 04/10/22 0857  WBC 12.2* 9.9  NEUTROABS 6,381 4,663  HGB 13.0* 12.6*  HCT 42.1 40.3  MCV 77.1* 75.9*  PLT 258 248   Lipid Panel: Recent Labs    10/03/21 0803  CHOL 118  HDL 55  LDLCALC 52  TRIG 37  CHOLHDL 2.1   TSH: No results for input(s):  "TSH" in the last 8760 hours. A1C: Lab Results  Component Value Date   HGBA1C 6.8 (H) 04/10/2022     Assessment/Plan 1. Diabetes mellitus due to underlying condition with diabetic autonomic neuropathy, without long-term current use of insulin ([place]) -Encouraged dietary compliance, routine foot care/monitoring and to keep up with diabetic eye exams through ophthalmology  -continues on jardiance and metformin  - Hemoglobin A1c;  Future - Microalbumin/Creatinine Ratio, Urine; Future  2. Morbid obesity ([place]) --education provided on healthy weight loss through increase in physical activity and proper nutrition   3. Essential hypertension -Blood pressure well controlled, goal bp <130/90 (for got to take medication today) Continue current medications and dietary modifications follow metabolic panel - COMPLETE METABOLIC PANEL WITH GFR; Future - CBC with Differential/Platelet; Future  4. Arthritis -plan to have total knee replacement.   5. Anemia, unspecified type -stable, continue to follow  hgb  6. Mixed hyperlipidemia -continue dietary modifications with statin  - Lipid panel; Future - COMPLETE METABOLIC PANEL WITH GFR; Future  7. Nonintractable epilepsy without status epilepticus, unspecified epilepsy type ([place]) -no recent seizures, has not been on any medication since 1975   Return in about 5 months (around 09/16/2022) for routine follow up, labs prior .  Carlos American. [place], [facility] [phone]

## 2022-04-27 DIAGNOSIS — H52223 Regular astigmatism, bilateral: Secondary | ICD-10-CM | POA: Diagnosis not present

## 2022-04-27 LAB — HM DIABETES EYE EXAM

## 2022-07-14 ENCOUNTER — Other Ambulatory Visit: Payer: Self-pay | Admitting: Nurse Practitioner

## 2022-08-03 DIAGNOSIS — M170 Bilateral primary osteoarthritis of knee: Secondary | ICD-10-CM | POA: Diagnosis not present

## 2022-09-07 DIAGNOSIS — M170 Bilateral primary osteoarthritis of knee: Secondary | ICD-10-CM | POA: Diagnosis not present

## 2022-09-18 ENCOUNTER — Encounter: Payer: Medicare Other | Admitting: Nurse Practitioner

## 2022-09-19 ENCOUNTER — Other Ambulatory Visit: Payer: Self-pay | Admitting: Nurse Practitioner

## 2022-09-19 DIAGNOSIS — E0843 Diabetes mellitus due to underlying condition with diabetic autonomic (poly)neuropathy: Secondary | ICD-10-CM

## 2022-09-19 DIAGNOSIS — I10 Essential (primary) hypertension: Secondary | ICD-10-CM

## 2022-09-22 ENCOUNTER — Ambulatory Visit: Payer: Medicare Other | Admitting: Nurse Practitioner

## 2022-10-03 ENCOUNTER — Encounter: Payer: Self-pay | Admitting: Nurse Practitioner

## 2022-10-04 ENCOUNTER — Encounter: Payer: Self-pay | Admitting: Nurse Practitioner

## 2022-10-04 ENCOUNTER — Ambulatory Visit (INDEPENDENT_AMBULATORY_CARE_PROVIDER_SITE_OTHER): Payer: Medicare Other | Admitting: Nurse Practitioner

## 2022-10-04 VITALS — BP 140/86 | HR 70 | Temp 97.3°F | Resp 18 | Ht 75.0 in | Wt 338.0 lb

## 2022-10-04 DIAGNOSIS — E0843 Diabetes mellitus due to underlying condition with diabetic autonomic (poly)neuropathy: Secondary | ICD-10-CM

## 2022-10-04 DIAGNOSIS — I10 Essential (primary) hypertension: Secondary | ICD-10-CM

## 2022-10-04 DIAGNOSIS — E782 Mixed hyperlipidemia: Secondary | ICD-10-CM

## 2022-10-04 DIAGNOSIS — Z0000 Encounter for general adult medical examination without abnormal findings: Secondary | ICD-10-CM

## 2022-10-04 NOTE — Patient Instructions (Signed)
  Michael [name] , Thank you for taking time to come for your Medicare Wellness Visit. I appreciate your ongoing commitment to your health goals. Please review the following plan we discussed and let me know if I can assist you in the future.   These are the goals we discussed:  Goals      Weight (lb) < 220 lb (99.8 kg)     Weight loss with diet and exercise         This is a list of the screening recommended for you and due dates:  Health Maintenance  Topic Date Due   Eye exam for diabetics  Never done   HIV Screening  Never done   Yearly kidney health urinalysis for diabetes  Never done   COVID-19 Vaccine (4 - 2023-24 season) 12/02/2021   Complete foot exam   04/08/2022   Hemoglobin A1C  10/09/2022   Flu Shot  11/02/2022   Yearly kidney function blood test for diabetes  04/11/2023   Medicare Annual Wellness Visit  10/04/2023   DTaP/Tdap/Td vaccine (2 - Td or Tdap) 11/11/2025   Colon Cancer Screening  10/31/2028   Pneumonia Vaccine  Completed   Hepatitis C Screening  Completed   HPV Vaccine  Aged Out   Zoster (Shingles) Vaccine  Discontinued

## 2022-10-04 NOTE — Progress Notes (Signed)
Subjective:   Michael [name]. is a 67 y.o. male who presents for a Welcome to Medicare exam.   Review of Systems:  Cardiac Risk Factors include: advanced age (>23men, >49 women);diabetes mellitus;hypertension;dyslipidemia;male gender;obesity (BMI >30kg/m2)     Objective:    Today's Vitals   10/04/22 0841 10/04/22 0858 10/04/22 0900  BP: (!) 150/90 (!) 140/86   Pulse: 70    Resp: 18    Temp: (!) 97.3 F (36.3 C)    SpO2: 95%    Weight: (!) 338 lb (153.3 kg)    Height: 6\' 3"  (1.905 m)    PainSc:   1    Body mass index is 42.25 kg/m.  Medications Outpatient Encounter Medications as of 10/04/2022  Medication Sig   amLODipine (NORVASC) 10 MG tablet Take 1 tablet by mouth once daily   atorvastatin (LIPITOR) 20 MG tablet Take 1 tablet (20 mg total) by mouth daily.   hydrochlorothiazide (HYDRODIURIL) 25 MG tablet Take 1 tablet by mouth once daily   JARDIANCE 10 MG TABS tablet TAKE 1 TABLET BY MOUTH ONCE DAILY BEFORE BREAKFAST   lisinopril (ZESTRIL) 10 MG tablet Take 1 tablet by mouth once daily   metFORMIN (GLUCOPHAGE) 1000 MG tablet TAKE 1 TABLET BY MOUTH TWICE DAILY WITH A MEAL   No facility-administered encounter medications on file as of 10/04/2022.     History: Past Medical History:  Diagnosis Date   Chest pain    Per incoming records from [facility]    Diabetes mellitus without complication (HCC)    Epilepsy (HCC)    Hypertension    Obesity, morbid, BMI 50 or higher (HCC)    Per incoming [facility] from [facility]    Type 2 diabetes mellitus [facility])    Past Surgical History:  Procedure Laterality Date   COLONOSCOPY WITH PROPOFOL N/A 11/01/2018   Procedure: COLONOSCOPY WITH PROPOFOL;  Surgeon: Jeani Hawking, MD;  [facility];  Service: Endoscopy;  Laterality: N/A;   KNEE ARTHROSCOPY Right 2005   Dr Luther Hearing   POLYPECTOMY  11/01/2018   Procedure: POLYPECTOMY;  Surgeon: Jeani Hawking, MD;  [facility];  Service: Endoscopy;;    History  reviewed. No pertinent family history. Social History   Occupational History   Not on file  Tobacco Use   Smoking status: Former    Types: Cigarettes    Quit date: 12/02/2004    Years since quitting: 17.8   Smokeless tobacco: Never   Tobacco comments:    8-10 a day off/on for 30 years  Vaping Use   Vaping Use: Never used  Substance and Sexual Activity   Alcohol use: Not Currently   Drug use: Never   Sexual activity: Not on file    Tobacco Counseling Counseling given: Not Answered Tobacco comments: 8-10 a day off/on for 30 years   Immunizations and Health Maintenance Immunization History  Administered Date(s) Administered   Fluad Quad(high Dose 65+) 01/15/2019, 02/25/2020   Influenza, High Dose Seasonal PF 02/01/2021   Influenza-Unspecified 02/01/2022   Moderna SARS-COV2 Booster Vaccination 02/01/2021   Moderna Sars-Covid-2 Vaccination 05/20/2019, 06/18/2019   PNEUMOCOCCAL CONJUGATE-20 04/17/2022   Pneumococcal Polysaccharide-23 01/15/2019   Tdap 11/12/2015   Health Maintenance Due  Topic Date Due   OPHTHALMOLOGY EXAM  Never done   HIV Screening  Never done   Diabetic kidney evaluation - Urine ACR  Never done   COVID-19 Vaccine (4 - 2023-24 season) 12/02/2021   FOOT EXAM  04/08/2022    Activities of  Daily Living    10/04/2022    8:58 AM  In your present state of health, do you have any difficulty performing the following activities:  Hearing? 0  Vision? 1  Difficulty concentrating or making decisions? 0  Walking or climbing stairs? 1  Comment due to knees  Dressing or bathing? 0  Doing errands, shopping? 0  Preparing Food and eating ? N  Using the Toilet? N  In the past six months, have you accidently leaked urine? N  Do you have problems with loss of bowel control? N  Managing your Medications? N  Managing your Finances? N  Housekeeping or managing your Housekeeping? N     Advanced Directives:      Assessment:    This is a routine wellness   examination for this patient .  Vision/Hearing screen No results found.  Dietary issues and exercise activities discussed:      Goals      Weight (lb) < 220 lb (99.8 kg)     Weight loss with diet and exercise         Depression Screen    10/04/2022    8:39 AM 04/17/2022    2:32 PM 04/08/2021    2:53 PM 02/25/2020    2:14 PM  PHQ 2/9 Scores  PHQ - 2 Score 0 0 0 0  Exception Documentation Other- indicate reason in comment box     Not completed awv        Fall Risk    10/04/2022    8:38 AM  Fall Risk   Falls in the past year? 0  Number falls in past yr: 0  Injury with Fall? 0  Risk for fall due to : No Fall Risks  Follow up Falls evaluation completed    Cognitive Function    01/15/2019    1:19 PM  MMSE - Mini Mental State Exam  Orientation to time 5  Orientation to Place 5  Registration 3  Attention/ Calculation 5  Recall 3  Language- name 2 objects 2  Language- repeat 1  Language- follow 3 step command 3  Language- read & follow direction 1  Write a sentence 1  Copy design 1  Total score 30        10/04/2022    8:39 AM  6CIT Screen  What Year? 0 points  What month? 0 points  What time? 0 points  Count back from 20 0 points  Months in reverse 0 points  Repeat phrase 0 points  Total Score 0 points    Patient Care Team: Sharon Seller, NP as PCP - General (Geriatric Medicine) Jeani Hawking, MD as Consulting Physician (Gastroenterology) Johney Maine, MD as Consulting Physician (Hematology) Salvatore Marvel, MD as Consulting Physician (Orthopedic Surgery)     Plan:     I have personally reviewed and noted the following in the patient's chart:   Medical and social history Use of alcohol, tobacco or illicit drugs  Current medications and supplements Functional ability and status Nutritional status Physical activity Advanced directives List of other physicians Hospitalizations, surgeries, and ER visits in previous 12  months Vitals Screenings to include cognitive, depression, and falls Referrals and appointments  In addition, I have reviewed and discussed with patient certain preventive protocols, quality metrics, and best practice recommendations. A written personalized care plan for preventive services as well as general preventive health recommendations were provided to patient.     Sharon Seller, NP 10/04/2022

## 2022-10-05 LAB — COMPLETE METABOLIC PANEL WITH GFR
AG Ratio: 1.2 (calc) (ref 1.0–2.5)
ALT: 7 U/L — ABNORMAL LOW (ref 9–46)
AST: 13 U/L (ref 10–35)
Albumin: 4.1 g/dL (ref 3.6–5.1)
Alkaline phosphatase (APISO): 111 U/L (ref 35–144)
BUN: 17 mg/dL (ref 7–25)
CO2: 29 mmol/L (ref 20–32)
Calcium: 9.6 mg/dL (ref 8.6–10.3)
Chloride: 103 mmol/L (ref 98–110)
Creat: 0.93 mg/dL (ref 0.70–1.35)
Globulin: 3.3 g/dL (calc) (ref 1.9–3.7)
Glucose, Bld: 103 mg/dL — ABNORMAL HIGH (ref 65–99)
Potassium: 3.8 mmol/L (ref 3.5–5.3)
Sodium: 139 mmol/L (ref 135–146)
Total Bilirubin: 0.4 mg/dL (ref 0.2–1.2)
Total Protein: 7.4 g/dL (ref 6.1–8.1)
eGFR: 91 mL/min/{1.73_m2} (ref >=60)

## 2022-10-05 LAB — CBC WITH DIFFERENTIAL/PLATELET
Absolute Monocytes: 442 {cells}/uL (ref 200–950)
Basophils Absolute: 37 {cells}/uL (ref 0–200)
Basophils Relative: 0.4 %
Eosinophils Absolute: 156 {cells}/uL (ref 15–500)
Eosinophils Relative: 1.7 %
HCT: 39.3 % (ref 38.5–50.0)
Hemoglobin: 12 g/dL — ABNORMAL LOW (ref 13.2–17.1)
Lymphs Abs: 4278 {cells}/uL — ABNORMAL HIGH (ref 850–3900)
MCH: 23.3 pg — ABNORMAL LOW (ref 27.0–33.0)
MCHC: 30.5 g/dL — ABNORMAL LOW (ref 32.0–36.0)
MCV: 76.5 fL — ABNORMAL LOW (ref 80.0–100.0)
MPV: 12.2 fL (ref 7.5–12.5)
Monocytes Relative: 4.8 %
Neutro Abs: 4287 {cells}/uL (ref 1500–7800)
Neutrophils Relative %: 46.6 %
Platelets: 256 10*3/uL (ref 140–400)
RBC: 5.14 Million/uL (ref 4.20–5.80)
RDW: 14.2 % (ref 11.0–15.0)
Total Lymphocyte: 46.5 %
WBC: 9.2 10*3/uL (ref 3.8–10.8)

## 2022-10-05 LAB — HEMOGLOBIN A1C
Hgb A1c MFr Bld: 6.7 %{Hb} — ABNORMAL HIGH
Mean Plasma Glucose: 146 mg/dL
eAG (mmol/L): 8.1 mmol/L

## 2022-10-05 LAB — LIPID PANEL
Cholesterol: 121 mg/dL (ref <200)
HDL: 43 mg/dL (ref >=40)
LDL Cholesterol (Calc): 64 mg/dL (calc)
Non-HDL Cholesterol (Calc): 78 mg/dL (calc) (ref <130)
Total CHOL/HDL Ratio: 2.8 (calc) (ref <5.0)
Triglycerides: 62 mg/dL (ref <150)

## 2022-10-09 ENCOUNTER — Ambulatory Visit (INDEPENDENT_AMBULATORY_CARE_PROVIDER_SITE_OTHER): Payer: Medicare Other | Admitting: Nurse Practitioner

## 2022-10-09 ENCOUNTER — Encounter: Payer: Self-pay | Admitting: Nurse Practitioner

## 2022-10-09 VITALS — BP 122/68 | HR 98 | Temp 97.7°F | Resp 18 | Ht 75.0 in | Wt 332.0 lb

## 2022-10-09 DIAGNOSIS — Z01818 Encounter for other preprocedural examination: Secondary | ICD-10-CM | POA: Diagnosis not present

## 2022-10-09 DIAGNOSIS — E0843 Diabetes mellitus due to underlying condition with diabetic autonomic (poly)neuropathy: Secondary | ICD-10-CM

## 2022-10-09 DIAGNOSIS — I10 Essential (primary) hypertension: Secondary | ICD-10-CM | POA: Diagnosis not present

## 2022-10-09 DIAGNOSIS — M1990 Unspecified osteoarthritis, unspecified site: Secondary | ICD-10-CM

## 2022-10-09 DIAGNOSIS — M71349 Other bursal cyst, unspecified hand: Secondary | ICD-10-CM

## 2022-10-09 DIAGNOSIS — E782 Mixed hyperlipidemia: Secondary | ICD-10-CM | POA: Diagnosis not present

## 2022-10-09 DIAGNOSIS — D649 Anemia, unspecified: Secondary | ICD-10-CM

## 2022-10-09 NOTE — Progress Notes (Unsigned)
Careteam: Patient Care Team: Sharon Seller, NP as PCP - General (Geriatric Medicine) Jeani Hawking, MD as Consulting Physician (Gastroenterology) Johney Maine, MD as Consulting Physician (Hematology) Salvatore Marvel, MD as Consulting Physician (Orthopedic Surgery)  PLACE OF SERVICE:  [facility]  Advanced Directive information Does Patient Have a Medical Advance Directive?: No, Would patient like information on creating a medical advance directive?: No - Patient declined  No Known Allergies  Chief Complaint  Patient presents with   Medical Management of Chronic Issues    Patient is here for a follow up for chronic conditions      HPI: Patient is a 67 y.o. male for routine follow up.  Putting off surgery until September due to his BMI. Surgeon wants him to lose 10-12 more lbs. He is going to have bilateral knee replacement left first as it is worse than right.   He has chronic chest pain but has had a thorough cardiac workup and was negative. Felt like it was muscular because when he moves his arm the pain goes away.  No shortness of breath, no palpitations.  No leg swelling.   Reports he had an eKG in the past and was sent to the ED for "nothing" cardiologist or ED doctor did not find anything wrong.   He states he has a nodule on his hand that he has noticed and wonders what can be done about it. No pain/discomfort. Mobile.   Review of Systems:  Review of Systems  Constitutional:  Negative for chills, fever and weight loss.  HENT:  Negative for tinnitus.   Respiratory:  Negative for cough, sputum production and shortness of breath.   Cardiovascular:  Negative for chest pain, palpitations and leg swelling.  Gastrointestinal:  Negative for abdominal pain, constipation, diarrhea and heartburn.  Genitourinary:  Negative for dysuria, frequency and urgency.  Musculoskeletal:  Negative for back pain, falls, joint pain and myalgias.  Skin: Negative.    Neurological:  Negative for dizziness and headaches.  Psychiatric/Behavioral:  Negative for depression and memory loss. The patient does not have insomnia.     Past Medical History:  Diagnosis Date   Chest pain    Per incoming records from [facility]    Diabetes mellitus without complication (HCC)    Epilepsy (HCC)    Hypertension    Obesity, morbid, BMI 50 or higher (HCC)    Per incoming [facility] from [facility]    Type 2 diabetes mellitus [facility])    Past Surgical History:  Procedure Laterality Date   COLONOSCOPY WITH PROPOFOL N/A 11/01/2018   Procedure: COLONOSCOPY WITH PROPOFOL;  Surgeon: Jeani Hawking, MD;  [facility];  Service: Endoscopy;  Laterality: N/A;   KNEE ARTHROSCOPY Right 2005   Dr Luther Hearing   POLYPECTOMY  11/01/2018   Procedure: POLYPECTOMY;  Surgeon: Jeani Hawking, MD;  [facility];  Service: Endoscopy;;   Social History:   reports that he quit smoking about 17 years ago. His smoking use included cigarettes. He has never used smokeless tobacco. He reports that he does not currently use alcohol. He reports that he does not use drugs.  History reviewed. No pertinent family history.  Medications: Patient's Medications  New Prescriptions   No medications on file  Previous Medications   AMLODIPINE (NORVASC) 10 MG TABLET    Take 1 tablet by mouth once daily   ATORVASTATIN (LIPITOR) 20 MG TABLET    Take 1 tablet (20 mg total) by mouth daily.   HYDROCHLOROTHIAZIDE (  HYDRODIURIL) 25 MG TABLET    Take 1 tablet by mouth once daily   JARDIANCE 10 MG TABS TABLET    TAKE 1 TABLET BY MOUTH ONCE DAILY BEFORE BREAKFAST   LISINOPRIL (ZESTRIL) 10 MG TABLET    Take 1 tablet by mouth once daily   METFORMIN (GLUCOPHAGE) 1000 MG TABLET    TAKE 1 TABLET BY MOUTH TWICE DAILY WITH A MEAL  Modified Medications   No medications on file  Discontinued Medications   No medications on file    Physical Exam:  Vitals:   10/09/22 1503  BP: 122/68  Pulse: 98   Resp: 18  Temp: 97.7 F (36.5 C)  SpO2: 97%  Weight: (!) 332 lb (150.6 kg)  Height: 6\' 3"  (1.905 m)   Body mass index is 41.5 kg/m. Wt Readings from Last 3 Encounters:  10/09/22 (!) 332 lb (150.6 kg)  10/04/22 (!) 338 lb (153.3 kg)  04/17/22 (!) 337 lb (152.9 kg)    Physical Exam Constitutional:      General: He is not in acute distress.    Appearance: He is well-developed. He is not diaphoretic.  HENT:     Head: Normocephalic and atraumatic.     Right Ear: External ear normal.     Left Ear: External ear normal.     Mouth/Throat:     Pharynx: No oropharyngeal exudate.  Eyes:     Conjunctiva/sclera: Conjunctivae normal.     Pupils: Pupils are equal, round, and reactive to light.  Cardiovascular:     Rate and Rhythm: Normal rate and regular rhythm.     Heart sounds: Normal heart sounds.  Pulmonary:     Effort: Pulmonary effort is normal.     Breath sounds: Normal breath sounds.  Abdominal:     General: Bowel sounds are normal.     Palpations: Abdomen is soft.  Musculoskeletal:        General: No tenderness.     Cervical back: Normal range of motion and neck supple.     Right lower leg: No edema.     Left lower leg: No edema.     Comments: Mobile cyst to to MCP joint on right hand.   Skin:    General: Skin is warm and dry.  Neurological:     Mental Status: He is alert and oriented to person, place, and time.     Labs reviewed: Basic Metabolic Panel: Recent Labs    04/10/22 0857 10/04/22 0813  NA 142 139  K 4.2 3.8  CL 103 103  CO2 29 29  GLUCOSE 92 103*  BUN 20 17  CREATININE 1.07 0.93  CALCIUM 9.4 9.6   Liver Function Tests: Recent Labs    04/10/22 0857 10/04/22 0813  AST 12 13  ALT 5* 7*  BILITOT 0.4 0.4  PROT 7.5 7.4   No results for input(s): "LIPASE", "AMYLASE" in the last 8760 hours. No results for input(s): "AMMONIA" in the last 8760 hours. CBC: Recent Labs    04/10/22 0857 10/04/22 0813  WBC 9.9 9.2  NEUTROABS 4,663 4,287  HGB  12.6* 12.0*  HCT 40.3 39.3  MCV 75.9* 76.5*  PLT 248 256   Lipid Panel: Recent Labs    10/04/22 0813  CHOL 121  HDL 43  LDLCALC 64  TRIG 62  CHOLHDL 2.8   TSH: No results for input(s): "TSH" in the last 8760 hours. A1C: Lab Results  Component Value Date   HGBA1C 6.7 (H) 10/04/2022  Assessment/Plan 1. Diabetes mellitus due to underlying condition with diabetic autonomic neuropathy, without long-term current use of insulin (HCC) -Encouraged dietary compliance, routine foot care/monitoring and to keep up with diabetic eye exams through ophthalmology  -continues current medication regimen.   2. Essential hypertension -Blood pressure well controlled, goal bp <140/90 Continue current medications and dietary modifications follow metabolic panel  3. Mixed hyperlipidemia LDL at goal on lipitor.   4. Morbid obesity (HCC) -looking to lose additional weight for surgery. Encouraged dietary compliance, routine foot care/monitoring and to keep up with diabetic eye exams through ophthalmology   5. Arthritis -ongoing, looking to schedule bilateral knee replacement once BMI at goal.   6. Anemia, unspecified type -ongoing and stable.    Return in about 6 months (around 04/11/2023) for routine follow up.  Janene Harvey. Biagio [facility] [phone]

## 2022-10-20 ENCOUNTER — Other Ambulatory Visit: Payer: Self-pay | Admitting: Nurse Practitioner

## 2022-10-20 DIAGNOSIS — E782 Mixed hyperlipidemia: Secondary | ICD-10-CM

## 2022-10-23 ENCOUNTER — Other Ambulatory Visit: Payer: Self-pay | Admitting: Nurse Practitioner

## 2022-11-10 ENCOUNTER — Ambulatory Visit: Payer: Self-pay | Admitting: Orthopedic Surgery

## 2022-11-14 DIAGNOSIS — M1712 Unilateral primary osteoarthritis, left knee: Secondary | ICD-10-CM | POA: Diagnosis not present

## 2022-11-17 NOTE — Progress Notes (Signed)
Surgery orders requested via Epic inbox. °

## 2022-11-20 ENCOUNTER — Other Ambulatory Visit (INDEPENDENT_AMBULATORY_CARE_PROVIDER_SITE_OTHER): Payer: Medicare Other

## 2022-11-20 ENCOUNTER — Ambulatory Visit (INDEPENDENT_AMBULATORY_CARE_PROVIDER_SITE_OTHER): Payer: Medicare Other | Admitting: Orthopedic Surgery

## 2022-11-20 DIAGNOSIS — M67441 Ganglion, right hand: Secondary | ICD-10-CM

## 2022-11-20 NOTE — Progress Notes (Signed)
Michael [name]. - 67 y.o. male MRN [id number]  Date of birth: Jun 09, 1955  Office Visit Note: Visit Date: 11/20/2022 PCP: Sharon Seller, NP Referred by: Sharon Seller, NP  Subjective: No chief complaint on file.  HPI: Michael [name]. is a pleasant 67 y.o. male who presents today for evaluation of ongoing right hand mass that has been present for approximately 2 months.  It is over the dorsal aspect of the right hand at the level of the MCP of the long finger.  It is mobile and compressible.  Does not have any excessive pain at rest, occasionally painful when pressure is applied.  He is a well-controlled diabetic, recent A1c 6.7.  He is right-hand dominant.  Visit Reason: right hand/ middle finger Hand dominance: right Occupation: Insurance underwriter  Diabetic: Yes/ 6.7 Heart/Lung History: none Blood Thinners: none  Prior Testing/EMG: none Injections (Date): none Treatments: none Prior Surgery: none    *only painful when pressure is applied*  Pertinent ROS were reviewed with the patient and found to be negative unless otherwise specified above in HPI.   Assessment & Plan: Visit Diagnoses:  1. Ganglion cyst of finger of right hand     Plan: Extensive discussion was had with patient today regarding his right hand mass.  This appears to be a cyst emanating from the extensor tendon of the long finger.  We discussed treatment options ranging from conservative to surgical.  From a conservative standpoint, he can trial observation and see if the cyst may reabsorb given that his symptoms are minimal.  I did offer surgical excision to be done as an outpatient, mass would then be sent for specimen to confirm diagnosis.  Risks and benefits of the procedure were discussed, risks including but not limited to infection, bleeding, scarring, stiffness, nerve injury, vascular injury, hardware complication, recurrence of symptoms and need for subsequent operation.  Patient  expressed understanding.    He has an upcoming knee replacement surgery in September.  He would like to get through the knee surgery prior to any intervention for the hand which I am in agreement with.  He will return to me once he has recovered from the knee replacement surgery for further discussion regarding scheduling for right hand mass surgical excision.    Follow-up: No follow-ups on file.   Meds & Orders: No orders of the defined types were placed in this encounter.   Orders Placed This Encounter  Procedures   XR Hand Complete Right     Procedures: No procedures performed      Clinical History: No specialty comments available.  He reports that he quit smoking about 17 years ago. His smoking use included cigarettes. He has never used smokeless tobacco.  Recent Labs    04/10/22 0857 10/04/22 0813  HGBA1C 6.8* 6.7*    Objective:   Vital Signs: There were no vitals taken for this visit.  Physical Exam  Gen: Well-appearing, in no acute distress; non-toxic CV: Regular Rate. Well-perfused. Warm.  Resp: Breathing unlabored on room air; no wheezing. Psych: Fluid speech in conversation; appropriate affect; normal thought process Neuro: Sensation intact throughout. No gross coordination deficits.   Ortho Exam Right hand: - Palpable mass notable over the dorsal aspect of the right hand in line with the long finger at the level of the MCP, mass is compressible and mobile - Able to perform full digital flexion and extension of all digits without restriction, composite fist without restriction -  Mild pain over the mass when palpated, mass measures approximately 0.5 cm x 0.5 cm in size - Sensation intact distally, hand is warm well-perfused  Imaging: XR Hand Complete Right  Result Date: 11/20/2022 X-rays of the right hand, multiple views were obtained today X-rays demonstrate well-preserved radiocarpal and midcarpal articulations without significant degenerative change.   Small joints are well-preserved throughout the digits as well.  Bone mineralization is appropriate.  No significant bony abnormalities appreciated.   Past Medical/Family/Surgical/Social History: Medications & Allergies reviewed per EMR, new medications updated. Patient Active Problem List   Diagnosis Date Noted   Diabetes mellitus due to underlying condition with diabetic autonomic neuropathy (HCC) 08/29/2017   Anemia 09/27/2016   Hyperlipidemia 09/27/2016   Morbid obesity (HCC) 09/27/2016   Essential hypertension 08/23/2016   Arthritis 08/21/2016   Past Medical History:  Diagnosis Date   Chest pain    Per incoming records from [facility]    Diabetes mellitus without complication (HCC)    Epilepsy (HCC)    Hypertension    Obesity, morbid, BMI 50 or higher (HCC)    Per incoming records from [facility]    Type 2 diabetes mellitus (HCC)    No family history on file. Past Surgical History:  Procedure Laterality Date   COLONOSCOPY WITH PROPOFOL N/A 11/01/2018   Procedure: COLONOSCOPY WITH PROPOFOL;  Surgeon: Jeani Hawking, MD;  Location: [facility];  Service: Endoscopy;  Laterality: N/A;   KNEE ARTHROSCOPY Right 2005   Dr Luther Hearing   POLYPECTOMY  11/01/2018   Procedure: POLYPECTOMY;  Surgeon: Jeani Hawking, MD;  Location: [facility];  Service: Endoscopy;;   Social History   Occupational History   Not on file  Tobacco Use   Smoking status: Former    Current packs/day: 0.00    Types: Cigarettes    Quit date: 12/02/2004    Years since quitting: 17.9   Smokeless tobacco: Never   Tobacco comments:    8-10 a day off/on for 30 years  Vaping Use   Vaping status: Never Used  Substance and Sexual Activity   Alcohol use: Not Currently   Drug use: Never   Sexual activity: Not on file    Michael [name], M.D. [facility] 8:35 AM

## 2022-11-21 NOTE — Patient Instructions (Addendum)
SURGICAL WAITING ROOM VISITATION  Patients having surgery or a procedure may have no more than 2 support people in the waiting area - these visitors may rotate.    Children under the age of 45 must have an adult with them who is not the patient.  Due to an increase in RSV and influenza rates and associated hospitalizations, children ages 54 and under may not visit patients in [facility] hospitals.  If the patient needs to stay at the hospital during part of their recovery, the visitor guidelines for inpatient rooms apply. Pre-op nurse will coordinate an appropriate time for 1 support person to accompany patient in pre-op.  This support person may not rotate.    Please refer to the [facility] website for the visitor guidelines for Inpatients (after your surgery is over and you are in a regular room).       Your procedure is scheduled on:  12/06/22    Report to [facility] Main Entrance    Report to admitting at  0830 AM   Call this number if you have problems the morning of surgery [phone]   Do not eat food :After Midnight.   After Midnight you may have the following liquids until _ 0800_____ AM DAY OF SURGERY  Water Non-Citrus Juices (without pulp, NO RED-Apple, White grape, White cranberry) Black Coffee (NO MILK/CREAM OR CREAMERS, sugar ok)  Clear Tea (NO MILK/CREAM OR CREAMERS, sugar ok) regular and decaf                             Plain Jell-O (NO RED)                                           Fruit ices (not with fruit pulp, NO RED)                                     Popsicles (NO RED)                                                               Sports drinks like Gatorade (NO RED)                    The day of surgery:  Drink ONE (1) Pre-Surgery Clear Ensure or G2 at   0800AM ( have completed by )  the morning of surgery. Drink in one sitting. Do not sip.  This drink was given to you during your hospital  pre-op appointment visit. Nothing else to drink  after completing the  Pre-Surgery Clear Ensure or G2.          If you have questions, please contact your surgeon's office.        Oral Hygiene is also important to reduce your risk of infection.                                    Remember - BRUSH YOUR TEETH THE MORNING OF SURGERY  WITH YOUR REGULAR TOOTHPASTE  DENTURES WILL BE REMOVED PRIOR TO SURGERY PLEASE DO NOT APPLY "Poly grip" OR ADHESIVES!!!   Do NOT smoke after Midnight   Stop all vitamins and herbal supplements 7 days before surgery. Amlodipine               Jardiance- Hold for 72 hours prior to surgery-              Metformin- None day of surgery    Take these medicines the morning of surgery with A SIP OF WATER: amlodipine   DO NOT TAKE ANY ORAL DIABETIC MEDICATIONS DAY OF YOUR SURGERY  Bring CPAP mask and tubing day of surgery.                              You may not have any metal on your body including hair pins, jewelry, and body piercing             Do not wear make-up, lotions, powders, perfumes/cologne, or deodorant  Do not wear nail polish including gel and S&S, artificial/acrylic nails, or any other type of covering on natural nails including finger and toenails. If you have artificial nails, gel coating, etc. that needs to be removed by a nail salon please have this removed prior to surgery or surgery may need to be canceled/ delayed if the surgeon/ anesthesia feels like they are unable to be safely monitored.   Do not shave  48 hours prior to surgery.               Men may shave face and neck.   Do not bring valuables to the hospital. [facility] IS NOT             RESPONSIBLE   FOR VALUABLES.   Contacts, glasses, dentures or bridgework may not be worn into surgery.   Bring small overnight bag day of surgery.   DO NOT BRING YOUR HOME MEDICATIONS TO THE HOSPITAL. PHARMACY WILL DISPENSE MEDICATIONS LISTED ON YOUR MEDICATION LIST TO YOU DURING YOUR ADMISSION IN THE HOSPITAL!    Patients discharged on  the day of surgery will not be allowed to drive home.  Someone NEEDS to stay with you for the first 24 hours after anesthesia.   Special Instructions: Bring a copy of your healthcare power of attorney and living will documents the day of surgery if you haven't scanned them before.              Please read over the following fact sheets you were given: IF YOU HAVE QUESTIONS ABOUT YOUR PRE-OP INSTRUCTIONS PLEASE CALL [phone]   If you received a COVID test during your pre-op visit  it is requested that you wear a mask when out in public, stay away from anyone that may not be feeling well and notify your surgeon if you develop symptoms. If you test positive for Covid or have been in contact with anyone that has tested positive in the last 10 days please notify you surgeon.      Pre-operative 5 CHG Bath Instructions   You can play a key role in reducing the risk of infection after surgery. Your skin needs to be as free of germs as possible. You can reduce the number of germs on your skin by washing with CHG (chlorhexidine gluconate) soap before surgery. CHG is an antiseptic soap that kills germs and continues to kill germs even after washing.  DO NOT use if you have an allergy to chlorhexidine/CHG or antibacterial soaps. If your skin becomes reddened or irritated, stop using the CHG and notify one of our RNs at [phone].   Please shower with the CHG soap starting 4 days before surgery using the following schedule:     Please keep in mind the following:  DO NOT shave, including legs and underarms, starting the day of your first shower.   You may shave your face at any point before/day of surgery.  Place clean sheets on your bed the day you start using CHG soap. Use a clean washcloth (not used since being washed) for each shower. DO NOT sleep with pets once you start using the CHG.   CHG Shower Instructions:  If you choose to wash your hair and private area, wash first with your  normal shampoo/soap.  After you use shampoo/soap, rinse your hair and body thoroughly to remove shampoo/soap residue.  Turn the water OFF and apply about 3 tablespoons (45 ml) of CHG soap to a CLEAN washcloth.  Apply CHG soap ONLY FROM YOUR NECK DOWN TO YOUR TOES (washing for 3-5 minutes)  DO NOT use CHG soap on face, private areas, open wounds, or sores.  Pay special attention to the area where your surgery is being performed.  If you are having back surgery, having someone wash your back for you may be helpful. Wait 2 minutes after CHG soap is applied, then you may rinse off the CHG soap.  Pat dry with a clean towel  Put on clean clothes/pajamas   If you choose to wear lotion, please use ONLY the CHG-compatible lotions on the back of this paper.     Additional instructions for the day of surgery: DO NOT APPLY any lotions, deodorants, cologne, or perfumes.   Put on clean/comfortable clothes.  Brush your teeth.  Ask your nurse before applying any prescription medications to the skin.      CHG Compatible Lotions   Aveeno Moisturizing lotion  Cetaphil Moisturizing Cream  Cetaphil Moisturizing Lotion  Clairol Herbal Essence Moisturizing Lotion, Dry Skin  Clairol Herbal Essence Moisturizing Lotion, Extra Dry Skin  Clairol Herbal Essence Moisturizing Lotion, Normal Skin  Curel Age Defying Therapeutic Moisturizing Lotion with Alpha Hydroxy  Curel Extreme Care Body Lotion  Curel Soothing Hands Moisturizing Hand Lotion  Curel Therapeutic Moisturizing Cream, Fragrance-Free  Curel Therapeutic Moisturizing Lotion, Fragrance-Free  Curel Therapeutic Moisturizing Lotion, Original Formula  Eucerin Daily Replenishing Lotion  Eucerin Dry Skin Therapy Plus Alpha Hydroxy Crme  Eucerin Dry Skin Therapy Plus Alpha Hydroxy Lotion  Eucerin Original Crme  Eucerin Original Lotion  Eucerin Plus Crme Eucerin Plus Lotion  Eucerin TriLipid Replenishing Lotion  Keri Anti-Bacterial Hand Lotion  Keri  Deep Conditioning Original Lotion Dry Skin Formula Softly Scented  Keri Deep Conditioning Original Lotion, Fragrance Free Sensitive Skin Formula  Keri Lotion Fast Absorbing Fragrance Free Sensitive Skin Formula  Keri Lotion Fast Absorbing Softly Scented Dry Skin Formula  Keri Original Lotion  Keri Skin Renewal Lotion Keri Silky Smooth Lotion  Keri Silky Smooth Sensitive Skin Lotion  Nivea Body Creamy Conditioning Oil  Nivea Body Extra Enriched Teacher, adult education Moisturizing Lotion Nivea Crme  Nivea Skin Firming Lotion  NutraDerm 30 Skin Lotion  NutraDerm Skin Lotion  NutraDerm Therapeutic Skin Cream  NutraDerm Therapeutic Skin Lotion  ProShield Protective Hand Cream  Provon moisturizing lotion

## 2022-11-21 NOTE — Progress Notes (Addendum)
Anesthesia Review:  PCP: Abbey Chatters , NP LOV 10/09/22  Cardiologist :  NOt seen by one since 2016 per pt Was followed for chest pain that was not cardiac per pt  Chest x-ray : EKG : 10/09/22  Echo : Stress test: Cardiac Cath :  Activity level: can do a flgiht of stairs without difficutly  Sleep Study/ CPAP : none  Fasting Blood Sugar :      / Checks Blood Sugar -- times a day:   Blood Thinner/ Instructions /Last Dose: ASA / Instructions/ Last Dose :    DM type 2- checs glucose once daily per pt  Hgba1c-10/31/22-6.7    Metformin- none day of surgery  Jardiance - hold for 72 hours prior to surgery- Last dose on 12/02/22    Legally Blind    Called pt on 11/24/22 at 1700pm  to make sure he understood preop instructions reviewed on 11/24/22 at preop appt. PT is aware he is to drink  G2 lower sugar at 0730am and have gone by 0800am morning of surgery on 12/06/22.  PT is also aware to stop all vitamins and herbal supplments for 7 days prior to surgery. pT is aware to take amlodipine  only    am of surgery.  Marland Kitchenand last dose of Jardiance on 12/02/22 prior to surgery and no metformin am of surgery.  pT is aware to continue other meds as prescribed prior to surgery   PT voiced understanding.

## 2022-11-22 NOTE — Progress Notes (Signed)
Sent message, via epic in basket, requesting orders in epic from surgeon.  

## 2022-11-22 NOTE — Progress Notes (Signed)
Second request for pre op orders in [facility]: Left a voicemail with [employer].

## 2022-11-23 ENCOUNTER — Ambulatory Visit: Payer: Self-pay | Admitting: Emergency Medicine

## 2022-11-23 DIAGNOSIS — G8929 Other chronic pain: Secondary | ICD-10-CM

## 2022-11-23 NOTE — H&P (Signed)
TOTAL KNEE ADMISSION H&P  Patient is being admitted for left total knee arthroplasty.  Subjective:  Chief Complaint:left knee pain.  HPI: Michael [name]., 67 y.o. male, has a history of pain and functional disability in the left knee due to arthritis and has failed non-surgical conservative treatments for greater than 12 weeks to includeNSAID's and/or analgesics, corticosteriod injections, use of assistive devices, weight reduction as appropriate, and activity modification.  Onset of symptoms was gradual, starting 1 years ago with gradually worsening course since that time. The patient noted no past surgery on the left knee(s).  Patient currently rates pain in the left knee(s) at 10 out of 10 with activity. Patient has night pain, worsening of pain with activity and weight bearing, pain that interferes with activities of daily living, and pain with passive range of motion.  Patient has evidence of periarticular osteophytes and joint space narrowing by imaging studies.  There is no active infection.  Patient Active Problem List   Diagnosis Date Noted   Diabetes mellitus due to underlying condition with diabetic autonomic neuropathy (HCC) 08/29/2017   Anemia 09/27/2016   Hyperlipidemia 09/27/2016   Morbid obesity (HCC) 09/27/2016   Essential hypertension 08/23/2016   Arthritis 08/21/2016   Past Medical History:  Diagnosis Date   Chest pain    Per incoming records from [facility]    Diabetes mellitus without complication (HCC)    Epilepsy (HCC)    Hypertension    Obesity, morbid, BMI 50 or higher (HCC)    Per incoming records from [facility]    Type 2 diabetes mellitus (HCC)     Past Surgical History:  Procedure Laterality Date   COLONOSCOPY WITH PROPOFOL N/A 11/01/2018   Procedure: COLONOSCOPY WITH PROPOFOL;  Surgeon: Jeani Hawking, MD;  Location: [facility];  Service: Endoscopy;  Laterality: N/A;   KNEE ARTHROSCOPY Right 2005   Dr Luther Hearing   POLYPECTOMY  11/01/2018    Procedure: POLYPECTOMY;  Surgeon: Jeani Hawking, MD;  Location: [facility];  Service: Endoscopy;;    Current Outpatient Medications  Medication Sig Dispense Refill Last Dose   amLODipine (NORVASC) 10 MG tablet Take 1 tablet by mouth once daily 90 tablet 1    atorvastatin (LIPITOR) 20 MG tablet Take 1 tablet by mouth once daily 90 tablet 1    hydrochlorothiazide (HYDRODIURIL) 25 MG tablet Take 1 tablet by mouth once daily 90 tablet 0    JARDIANCE 10 MG TABS tablet TAKE 1 TABLET BY MOUTH ONCE DAILY BEFORE BREAKFAST 90 tablet 0    lisinopril (ZESTRIL) 10 MG tablet Take 1 tablet by mouth once daily 90 tablet 0    metFORMIN (GLUCOPHAGE) 1000 MG tablet TAKE 1 TABLET BY MOUTH TWICE DAILY WITH A MEAL 180 tablet 1    naproxen sodium (ALEVE) 220 MG tablet Take 220 mg by mouth 2 (two) times daily as needed (pain.).      No current facility-administered medications for this visit.   No Known Allergies  Social History   Tobacco Use   Smoking status: Former    Current packs/day: 0.00    Types: Cigarettes    Quit date: 12/02/2004    Years since quitting: 17.9   Smokeless tobacco: Never   Tobacco comments:    8-10 a day off/on for 30 years  Substance Use Topics   Alcohol use: Not Currently    No family history on file.   Review of Systems  Musculoskeletal:  Positive for arthralgias.  All other systems reviewed and are  negative.   Objective:  Physical Exam Constitutional:      General: He is not in acute distress.    Appearance: Normal appearance. He is normal weight.     Comments: Morbid obesity  HENT:     Head: Normocephalic and atraumatic.  Eyes:     General: No scleral icterus.    Conjunctiva/sclera: Conjunctivae normal.     Pupils: Pupils are equal, round, and reactive to light.     Comments: Legally blind  Cardiovascular:     Rate and Rhythm: Normal rate and regular rhythm.     Pulses: Normal pulses.     Heart sounds: Normal heart sounds.  Pulmonary:     Effort: Pulmonary  effort is normal. No respiratory distress.     Breath sounds: Normal breath sounds.  Abdominal:     General: Bowel sounds are normal. There is no distension.     Palpations: Abdomen is soft.     Tenderness: There is no abdominal tenderness.  Musculoskeletal:        General: Tenderness present.     Cervical back: Normal range of motion and neck supple.     Comments: TTP over medial and lateral joint line.  No calf tenderness, swelling, or erythema.  No overlying lesions of area of chief complaint.  Decreased strength and ROM due to elicited pain.  Pre-operative ROM 0-100.  Dorsiflexion and plantarflexion intact.  Stable to varus and valgus stress.  BLE appear grossly neurovascularly intact.  Gait mildly antalgic.   Lymphadenopathy:     Cervical: No cervical adenopathy.  Skin:    General: Skin is warm and dry.     Capillary Refill: Capillary refill takes less than 2 seconds.     Findings: No erythema or rash.  Neurological:     General: No focal deficit present.     Mental Status: He is alert and oriented to person, place, and time.  Psychiatric:        Mood and Affect: Mood normal.        Behavior: Behavior normal.     Vital signs in last 24 hours: @VSRANGES @  Labs:   Estimated body mass index is 41.5 kg/m as calculated from the following:   Height as of 10/09/22: 6\' 3"  (1.905 m).   Weight as of 10/09/22: 150.6 kg.   Imaging Review Plain radiographs demonstrate severe degenerative joint disease of the left knee(s). The overall alignment is moderate varus . The bone quality appears to be fair for age and reported activity level.      Assessment/Plan:  End stage arthritis, left knee   The patient history, physical examination, clinical judgment of the provider and imaging studies are consistent with end stage degenerative joint disease of the left knee(s) and total knee arthroplasty is deemed medically necessary. The treatment options including medical management, injection  therapy arthroscopy and arthroplasty were discussed at length. The risks and benefits of total knee arthroplasty were presented and reviewed. The risks due to aseptic loosening, infection, stiffness, patella tracking problems, thromboembolic complications and other imponderables were discussed. The patient acknowledged the explanation, agreed to proceed with the plan and consent was signed. Patient is being admitted for inpatient treatment for surgery, pain control, PT, OT, prophylactic antibiotics, VTE prophylaxis, progressive ambulation and ADL's and discharge planning. The patient is planning to be discharge home with [facility].    Anticipated LOS equal to or greater than 2 midnights due to - Age 32 and older with one or more of  the following:  - Obesity  - Expected need for hospital services (PT, OT, Nursing) required for safe  discharge  - Anticipated need for postoperative skilled nursing care or inpatient rehab  - Active co-morbidities: Diabetes mellitus, HTN, HLD, legally blind, morbid obesity class III OR   - Unanticipated findings during/Post Surgery: None  - Patient is a high risk of re-admission due to: None

## 2022-11-24 ENCOUNTER — Encounter (HOSPITAL_COMMUNITY): Payer: Self-pay

## 2022-11-24 ENCOUNTER — Other Ambulatory Visit: Payer: Self-pay

## 2022-11-24 ENCOUNTER — Encounter (HOSPITAL_COMMUNITY)
Admission: RE | Admit: 2022-11-24 | Discharge: 2022-11-24 | Disposition: A | Discharge status: Home / Self Care | Payer: Medicare Other | Source: Ambulatory Visit | Attending: Orthopedic Surgery | Admitting: Orthopedic Surgery

## 2022-11-24 VITALS — BP 136/80 | HR 53 | Temp 98.2°F | Resp 16 | Ht 75.0 in | Wt 321.0 lb

## 2022-11-24 DIAGNOSIS — G8929 Other chronic pain: Secondary | ICD-10-CM | POA: Insufficient documentation

## 2022-11-24 DIAGNOSIS — M25562 Pain in left knee: Secondary | ICD-10-CM | POA: Diagnosis not present

## 2022-11-24 DIAGNOSIS — Z01818 Encounter for other preprocedural examination: Secondary | ICD-10-CM

## 2022-11-24 DIAGNOSIS — Z01812 Encounter for preprocedural laboratory examination: Secondary | ICD-10-CM | POA: Diagnosis not present

## 2022-11-24 HISTORY — DX: Unspecified osteoarthritis, unspecified site: M19.90

## 2022-11-24 HISTORY — DX: Anemia, unspecified: D64.9

## 2022-11-24 LAB — COMPREHENSIVE METABOLIC PANEL
ALT: 10 U/L (ref 0–44)
AST: 17 U/L (ref 15–41)
Albumin: 4 g/dL (ref 3.5–5.0)
Alkaline Phosphatase: 99 U/L (ref 38–126)
Anion gap: 8 (ref 5–15)
BUN: 14 mg/dL (ref 8–23)
CO2: 27 mmol/L (ref 22–32)
Calcium: 9.3 mg/dL (ref 8.9–10.3)
Chloride: 101 mmol/L (ref 98–111)
Creatinine, Ser: 1.01 mg/dL (ref 0.61–1.24)
GFR, Estimated: >60 mL/min (ref >60)
Glucose, Bld: 102 mg/dL — ABNORMAL HIGH (ref 70–99)
Potassium: 3.6 mmol/L (ref 3.5–5.1)
Sodium: 136 mmol/L (ref 135–145)
Total Bilirubin: 0.6 mg/dL (ref 0.3–1.2)
Total Protein: 7.9 g/dL (ref 6.5–8.1)

## 2022-11-24 LAB — CBC WITH DIFFERENTIAL/PLATELET
Abs Immature Granulocytes: 0.02 10*3/uL (ref 0.00–0.07)
Basophils Absolute: 0.1 10*3/uL (ref 0.0–0.1)
Basophils Relative: 1 %
Eosinophils Absolute: 0.2 10*3/uL (ref 0.0–0.5)
Eosinophils Relative: 2 %
HCT: 41.6 % (ref 39.0–52.0)
Hemoglobin: 12.4 g/dL — ABNORMAL LOW (ref 13.0–17.0)
Immature Granulocytes: 0 %
Lymphocytes Relative: 59 %
Lymphs Abs: 5.5 10*3/uL — ABNORMAL HIGH (ref 0.7–4.0)
MCH: 23.7 pg — ABNORMAL LOW (ref 26.0–34.0)
MCHC: 29.8 g/dL — ABNORMAL LOW (ref 30.0–36.0)
MCV: 79.4 fL — ABNORMAL LOW (ref 80.0–100.0)
Monocytes Absolute: 0.5 10*3/uL (ref 0.1–1.0)
Monocytes Relative: 6 %
Neutro Abs: 2.9 10*3/uL (ref 1.7–7.7)
Neutrophils Relative %: 32 %
Platelets: 268 10*3/uL (ref 150–400)
RBC: 5.24 MIL/uL (ref 4.22–5.81)
RDW: 14.7 % (ref 11.5–15.5)
WBC: 9.2 10*3/uL (ref 4.0–10.5)
nRBC: 0 % (ref 0.0–0.2)

## 2022-11-24 LAB — TYPE AND SCREEN
ABO/RH(D): O POS
Antibody Screen: NEGATIVE

## 2022-11-24 LAB — GLUCOSE, CAPILLARY: Glucose-Capillary: 93 mg/dL (ref 70–99)

## 2022-11-24 LAB — SURGICAL PCR SCREEN
MRSA, PCR: NEGATIVE
Staphylococcus aureus: NEGATIVE

## 2022-11-27 ENCOUNTER — Encounter (HOSPITAL_COMMUNITY): Payer: Self-pay | Admitting: Medical

## 2022-11-30 DIAGNOSIS — M1712 Unilateral primary osteoarthritis, left knee: Secondary | ICD-10-CM | POA: Diagnosis not present

## 2022-12-06 ENCOUNTER — Encounter (HOSPITAL_COMMUNITY): Admission: RE | Payer: Self-pay | Source: Ambulatory Visit

## 2022-12-06 ENCOUNTER — Ambulatory Visit (HOSPITAL_COMMUNITY): Admission: RE | Admit: 2022-12-06 | Payer: Medicare Other | Source: Ambulatory Visit | Admitting: Orthopedic Surgery

## 2022-12-06 SURGERY — ARTHROPLASTY, KNEE, TOTAL
Anesthesia: Spinal | Site: Knee | Laterality: Left

## 2023-01-02 ENCOUNTER — Other Ambulatory Visit: Payer: Self-pay | Admitting: Nurse Practitioner

## 2023-01-02 DIAGNOSIS — I10 Essential (primary) hypertension: Secondary | ICD-10-CM

## 2023-01-02 DIAGNOSIS — E0843 Diabetes mellitus due to underlying condition with diabetic autonomic (poly)neuropathy: Secondary | ICD-10-CM

## 2023-01-21 ENCOUNTER — Other Ambulatory Visit: Payer: Self-pay

## 2023-01-21 ENCOUNTER — Ambulatory Visit
Admission: RE | Admit: 2023-01-21 | Discharge: 2023-01-21 | Disposition: A | Discharge status: Home / Self Care | Payer: Medicare Other | Source: Ambulatory Visit | Attending: Internal Medicine | Admitting: Internal Medicine

## 2023-01-21 VITALS — BP 137/71 | HR 83 | Temp 98.0°F | Resp 20

## 2023-01-21 DIAGNOSIS — R051 Acute cough: Secondary | ICD-10-CM | POA: Diagnosis not present

## 2023-01-21 DIAGNOSIS — B349 Viral infection, unspecified: Secondary | ICD-10-CM | POA: Insufficient documentation

## 2023-01-21 DIAGNOSIS — Z1152 Encounter for screening for COVID-19: Secondary | ICD-10-CM | POA: Diagnosis not present

## 2023-01-21 MED ORDER — BENZONATATE 100 MG PO CAPS: 100.0000 mg | ORAL_CAPSULE | Freq: Three times a day (TID) | ORAL | 0 refills | Status: DC | PRN

## 2023-01-21 NOTE — Discharge Instructions (Addendum)
I suspect that you have a viral illness causing your persistent cough.  I have prescribed you cough medication to take as needed.  COVID test is pending.  We will call if it is positive.  Please follow-up if any symptoms persist or worsen.

## 2023-01-21 NOTE — ED Provider Notes (Signed)
[facility]    CSN: [id number] Arrival date & time: 01/21/23  0272      History   Chief Complaint Chief Complaint  Patient presents with   Cough    Entered by patient    HPI Michael [name]. is a 67 y.o. male.   Patient presents with cough and generally not feeling well.  Patient states that he got COVID and flu vaccines on 10/10 and started generally not feeling well about 1 to 2 days later. States that seemed to improve but that he developed coughing approximately 4 days ago.  He denies any significant runny nose or nasal congestion.  Denies chest pain or shortness of breath.  Denies any associated fever.  Denies any known sick contacts.  Denies history of asthma or COPD.   Cough   Past Medical History:  Diagnosis Date   Anemia    Arthritis    Chest pain    Per incoming records from [facility]    Diabetes mellitus without complication (HCC)    Epilepsy (HCC)    Hypertension    Obesity, morbid, BMI 50 or higher (HCC)    Per incoming records from [facility]    Type 2 diabetes mellitus [facility])     Patient Active Problem List   Diagnosis Date Noted   Diabetes mellitus due to underlying condition with diabetic autonomic neuropathy (HCC) 08/29/2017   Anemia 09/27/2016   Hyperlipidemia 09/27/2016   Morbid obesity (HCC) 09/27/2016   Essential hypertension 08/23/2016   Arthritis 08/21/2016    Past Surgical History:  Procedure Laterality Date   COLONOSCOPY WITH PROPOFOL N/A 11/01/2018   Procedure: COLONOSCOPY WITH PROPOFOL;  Surgeon: Jeani Hawking, MD;  Location: [facility];  Service: Endoscopy;  Laterality: N/A;   KNEE ARTHROSCOPY Right 2005   Dr Luther Hearing   POLYPECTOMY  11/01/2018   Procedure: POLYPECTOMY;  Surgeon: Jeani Hawking, MD;  Location: [facility];  Service: Endoscopy;;       Home Medications    Prior to Admission medications   Medication Sig Start Date End Date Taking? Authorizing Provider  benzonatate (TESSALON) 100 MG  capsule Take 1 capsule (100 mg total) by mouth every 8 (eight) hours as needed for cough. 01/21/23  Yes Aerabella Galasso, Rolly Salter E, FNP  amLODipine (NORVASC) 10 MG tablet Take 1 tablet by mouth once daily 10/24/22   Sharon Seller, NP  atorvastatin (LIPITOR) 20 MG tablet Take 1 tablet by mouth once daily 10/20/22   Sharon Seller, NP  hydrochlorothiazide (HYDRODIURIL) 25 MG tablet Take 1 tablet by mouth once daily 01/03/23   Sharon Seller, NP  JARDIANCE 10 MG TABS tablet TAKE 1 TABLET BY MOUTH ONCE DAILY BEFORE BREAKFAST 01/03/23   Sharon Seller, NP  lisinopril (ZESTRIL) 10 MG tablet Take 1 tablet by mouth once daily 01/03/23   Sharon Seller, NP  metFORMIN (GLUCOPHAGE) 1000 MG tablet TAKE 1 TABLET BY MOUTH TWICE DAILY WITH A MEAL 02/07/22   Sharon Seller, NP  naproxen sodium (ALEVE) 220 MG tablet Take 220 mg by mouth 2 (two) times daily as needed (pain.).    [provider]    Family History History reviewed. No pertinent family history.  Social History Social History   Tobacco Use   Smoking status: Former    Current packs/day: 0.00    Types: Cigarettes    Quit date: 12/02/2004    Years since quitting: 18.1   Smokeless tobacco: Never   Tobacco comments:  8-10 a day off/on for 30 years  Vaping Use   Vaping status: Never Used  Substance Use Topics   Alcohol use: Not Currently    Comment: quit 2005   Drug use: Never     Allergies   Patient has no known allergies.   Review of Systems Review of Systems Per HPI  Physical Exam Triage Vital Signs ED Triage Vitals  Encounter Vitals Group     BP 01/21/23 1024 137/71     Systolic BP Percentile --      Diastolic BP Percentile --      Pulse Rate 01/21/23 1024 81     Resp 01/21/23 1024 20     Temp 01/21/23 1024 98 F (36.7 C)     Temp Source 01/21/23 1024 Oral     SpO2 01/21/23 1024 93 %     Weight --      Height --      Head Circumference --      Peak Flow --      Pain Score 01/21/23 1032 0     Pain  Loc --      Pain Education --      Exclude from Growth Chart --    No data found.  Updated Vital Signs BP 137/71 (BP Location: Right Arm)   Pulse 83   Temp 98 F (36.7 C) (Oral)   Resp 20   SpO2 98%   Visual Acuity Right Eye Distance:   Left Eye Distance:   Bilateral Distance:    Right Eye Near:   Left Eye Near:    Bilateral Near:     Physical Exam Constitutional:      General: He is not in acute distress.    Appearance: Normal appearance. He is not toxic-appearing or diaphoretic.  HENT:     Head: Normocephalic and atraumatic.     Right Ear: Tympanic membrane and ear canal normal.     Left Ear: Tympanic membrane and ear canal normal.     Nose: Congestion present.     Mouth/Throat:     Mouth: Mucous membranes are moist.     Pharynx: No posterior oropharyngeal erythema.  Eyes:     Extraocular Movements: Extraocular movements intact.     Conjunctiva/sclera: Conjunctivae normal.     Pupils: Pupils are equal, round, and reactive to light.  Cardiovascular:     Rate and Rhythm: Normal rate and regular rhythm.     Pulses: Normal pulses.     Heart sounds: Normal heart sounds.  Pulmonary:     Effort: Pulmonary effort is normal. No respiratory distress.     Breath sounds: Normal breath sounds. No stridor. No wheezing, rhonchi or rales.  Abdominal:     General: Abdomen is flat. Bowel sounds are normal.     Palpations: Abdomen is soft.  Musculoskeletal:        General: Normal range of motion.     Cervical back: Normal range of motion.  Skin:    General: Skin is warm and dry.  Neurological:     General: No focal deficit present.     Mental Status: He is alert and oriented to person, place, and time. Mental status is at baseline.  Psychiatric:        Mood and Affect: Mood normal.        Behavior: Behavior normal.      UC Treatments / Results  Labs (all labs ordered are listed, but only abnormal results are displayed) Labs Reviewed  SARS CORONAVIRUS 2 (TAT 6-24  HRS)    EKG   Radiology No results found.  Procedures Procedures (including critical care time)  Medications Ordered in UC Medications - No data to display  Initial Impression / Assessment and Plan / UC Course  I have reviewed the triage vital signs and the nursing notes.  Pertinent labs & imaging results that were available during my care of the patient were reviewed by me and considered in my medical decision making (see chart for details).     Suspect viral cause to patient's symptoms.  There are no adventitious lung sounds on exam so do not think that chest imaging is necessary.  Oxygen rechecked and was 98% and sustaining so do not think that emergent evaluation is necessary.  Will prescribe benzonatate to take as needed for cough and patient was advised of supportive care, adequate fluids, rest, symptom management.  COVID test pending.  Advised strict follow-up precautions if any symptoms persist or worsen.  Patient verbalized understanding and was agreeable with plan. Final Clinical Impressions(s) / UC Diagnoses   Final diagnoses:  Viral illness  Acute cough     Discharge Instructions      I suspect that you have a viral illness causing your persistent cough.  I have prescribed you cough medication to take as needed.  COVID test is pending.  We will call if it is positive.  Please follow-up if any symptoms persist or worsen.    ED Prescriptions     Medication Sig Dispense Auth. Provider   benzonatate (TESSALON) 100 MG capsule Take 1 capsule (100 mg total) by mouth every 8 (eight) hours as needed for cough. 21 capsule [place], Acie Fredrickson, [place]      PDMP not reviewed this encounter.   Gustavus Bryant, [place] 01/21/23 1104

## 2023-01-21 NOTE — ED Triage Notes (Signed)
Cough since last WednesdayErvin Knack, NP at bedside to eval during triage

## 2023-01-22 LAB — SARS CORONAVIRUS 2 (TAT 6-24 HRS): SARS Coronavirus 2: NEGATIVE

## 2023-01-30 DIAGNOSIS — M1712 Unilateral primary osteoarthritis, left knee: Secondary | ICD-10-CM | POA: Diagnosis not present

## 2023-02-06 DIAGNOSIS — M1712 Unilateral primary osteoarthritis, left knee: Secondary | ICD-10-CM | POA: Diagnosis not present

## 2023-02-09 ENCOUNTER — Ambulatory Visit: Payer: Self-pay | Admitting: Emergency Medicine

## 2023-02-09 DIAGNOSIS — G8929 Other chronic pain: Secondary | ICD-10-CM

## 2023-02-09 NOTE — H&P (Signed)
TOTAL KNEE ADMISSION H&P  Patient is being admitted for left total knee arthroplasty.  Subjective:  Chief Complaint:left knee pain.  HPI: Domenick Chatmon., 67 y.o. male, has a history of pain and functional disability in the left knee due to arthritis and has failed non-surgical conservative treatments for greater than 12 weeks to includeNSAID's and/or analgesics, corticosteriod injections, activity modification, and use of assistive devices .  Onset of symptoms was gradual, starting 6 years ago with gradually worsening course since that time. The patient noted no past surgery on the left knee(s).  Patient currently rates pain in the left knee(s) at 10 out of 10 with activity. Patient has night pain, worsening of pain with activity and weight bearing, pain that interferes with activities of daily living, and pain with passive range of motion.  Patient has evidence of periarticular osteophytes and joint space narrowing by imaging studies.  There is no active infection.  Patient Active Problem List   Diagnosis Date Noted   Diabetes mellitus due to underlying condition with diabetic autonomic neuropathy (HCC) 08/29/2017   Anemia 09/27/2016   Hyperlipidemia 09/27/2016   Morbid obesity (HCC) 09/27/2016   Essential hypertension 08/23/2016   Arthritis 08/21/2016   Past Medical History:  Diagnosis Date   Anemia    Arthritis    Chest pain    Per incoming records from [facility]    Diabetes mellitus without complication (HCC)    Epilepsy (HCC)    Hypertension    Obesity, morbid, BMI 50 or higher (HCC)    Per incoming records from [facility]    Type 2 diabetes mellitus (HCC)     Past Surgical History:  Procedure Laterality Date   COLONOSCOPY WITH PROPOFOL N/A 11/01/2018   Procedure: COLONOSCOPY WITH PROPOFOL;  Surgeon: Jeani Hawking, MD;  Location: [facility];  Service: Endoscopy;  Laterality: N/A;   KNEE ARTHROSCOPY Right 2005   Dr Luther Hearing   POLYPECTOMY  11/01/2018   Procedure:  POLYPECTOMY;  Surgeon: Jeani Hawking, MD;  [facility]: [facility];  Service: Endoscopy;;    Current Outpatient Medications  Medication Sig Dispense Refill Last Dose   amLODipine (NORVASC) 10 MG tablet Take 1 tablet by mouth once daily 90 tablet 1    atorvastatin (LIPITOR) 20 MG tablet Take 1 tablet by mouth once daily 90 tablet 1    benzonatate (TESSALON) 100 MG capsule Take 1 capsule (100 mg total) by mouth every 8 (eight) hours as needed for cough. 21 capsule 0    hydrochlorothiazide (HYDRODIURIL) 25 MG tablet Take 1 tablet by mouth once daily 90 tablet 0    JARDIANCE 10 MG TABS tablet TAKE 1 TABLET BY MOUTH ONCE DAILY BEFORE BREAKFAST 90 tablet 0    lisinopril (ZESTRIL) 10 MG tablet Take 1 tablet by mouth once daily 90 tablet 0    metFORMIN (GLUCOPHAGE) 1000 MG tablet TAKE 1 TABLET BY MOUTH TWICE DAILY WITH A MEAL 180 tablet 1    naproxen sodium (ALEVE) 220 MG tablet Take 220 mg by mouth 2 (two) times daily as needed (pain.).      No current facility-administered medications for this visit.   No Known Allergies  Social History   Tobacco Use   Smoking status: Former    Current packs/day: 0.00    Types: Cigarettes    Quit date: 12/02/2004    Years since quitting: 18.2   Smokeless tobacco: Never   Tobacco comments:    8-10 a day off/on for 30 years  Substance Use Topics  Alcohol use: Not Currently    Comment: quit 2005    No family history on file.   Review of Systems  Musculoskeletal:  Positive for arthralgias.  All other systems reviewed and are negative.   Objective:  Physical Exam Constitutional:      General: He is not in acute distress.    Appearance: Normal appearance. He is normal weight.  HENT:     Head: Normocephalic and atraumatic.  Eyes:     Conjunctiva/sclera: Conjunctivae normal.     Pupils: Pupils are equal, round, and reactive to light.     Comments: Legally blind  Cardiovascular:     Rate and Rhythm: Normal rate and regular rhythm.     Pulses:  Normal pulses.     Heart sounds: Normal heart sounds.  Pulmonary:     Effort: Pulmonary effort is normal. No respiratory distress.     Breath sounds: Normal breath sounds.  Abdominal:     General: Bowel sounds are normal. There is no distension.     Palpations: Abdomen is soft.     Tenderness: There is no abdominal tenderness.  Musculoskeletal:        General: Tenderness present.     Cervical back: Normal range of motion and neck supple.     Comments: TTP over medial and lateral joint line, medial worse than lateral.  No calf tenderness, swelling, or erythema.  No overlying lesions of area of chief complaint.  Decreased strength and ROM due to elicited pain.  Pre-operative ROM 0-100.  Dorsiflexion and plantarflexion intact.  Stable to varus and valgus stress.  BLE appear grossly neurovascularly intact.  Gait mildly antalgic.   Lymphadenopathy:     Cervical: No cervical adenopathy.  Skin:    General: Skin is warm and dry.     Capillary Refill: Capillary refill takes less than 2 seconds.     Findings: No erythema or rash.  Neurological:     General: No focal deficit present.     Mental Status: He is alert and oriented to person, place, and time.  Psychiatric:        Mood and Affect: Mood normal.        Behavior: Behavior normal.     Vital signs in last 24 hours: @VSRANGES @  Labs:   Estimated body mass index is 40.12 kg/m as calculated from the following:   Height as of 11/24/22: 6\' 3"  (1.905 m).   Weight as of 11/24/22: 145.6 kg.   Imaging Review Plain radiographs demonstrate severe degenerative joint disease of the left knee(s). The overall alignment issignificant varus. The bone quality appears to be fair for age and reported activity level.      Assessment/Plan:  End stage arthritis, left knee   The patient history, physical examination, clinical judgment of the provider and imaging studies are consistent with end stage degenerative joint disease of the left knee(s)  and total knee arthroplasty is deemed medically necessary. The treatment options including medical management, injection therapy arthroscopy and arthroplasty were discussed at length. The risks and benefits of total knee arthroplasty were presented and reviewed. The risks due to aseptic loosening, infection, stiffness, patella tracking problems, thromboembolic complications and other imponderables were discussed. The patient acknowledged the explanation, agreed to proceed with the plan and consent was signed. Patient is being admitted for inpatient treatment for surgery, pain control, PT, OT, prophylactic antibiotics, VTE prophylaxis, progressive ambulation and ADL's and discharge planning. The patient is planning to be discharged home with home health  services ([facility]).    Anticipated LOS equal to or greater than 2 midnights due to - Age 35 and older with one or more of the following:  - Obesity  - Expected need for hospital services (PT, OT, Nursing) required for safe  discharge  - Anticipated need for postoperative skilled nursing care or inpatient rehab  - Active co-morbidities: Diabetes and legally blind OR   - Unanticipated findings during/Post Surgery: None  - Patient is a high risk of re-admission due to: Barriers to post-acute care (logistical, no family support in home)

## 2023-02-09 NOTE — H&P (View-Only) (Signed)
 TOTAL KNEE ADMISSION H&P  Patient is being admitted for left total knee arthroplasty.  Subjective:  Chief Complaint:left knee pain.  HPI: Michael [name]., 67 y.o. male, has a history of pain and functional disability in the left knee due to arthritis and has failed non-surgical conservative treatments for greater than 12 weeks to includeNSAID's and/or analgesics, corticosteriod injections, activity modification, and use of assistive devices .  Onset of symptoms was gradual, starting 6 years ago with gradually worsening course since that time. The patient noted no past surgery on the left knee(s).  Patient currently rates pain in the left knee(s) at 10 out of 10 with activity. Patient has night pain, worsening of pain with activity and weight bearing, pain that interferes with activities of daily living, and pain with passive range of motion.  Patient has evidence of periarticular osteophytes and joint space narrowing by imaging studies.  There is no active infection.  Patient Active Problem List   Diagnosis Date Noted   Diabetes mellitus due to underlying condition with diabetic autonomic neuropathy (HCC) 08/29/2017   Anemia 09/27/2016   Hyperlipidemia 09/27/2016   Morbid obesity (HCC) 09/27/2016   Essential hypertension 08/23/2016   Arthritis 08/21/2016   Past Medical History:  Diagnosis Date   Anemia    Arthritis    Chest pain    Per incoming records from [facility]    Diabetes mellitus without complication (HCC)    Epilepsy (HCC)    Hypertension    Obesity, morbid, BMI 50 or higher (HCC)    Per incoming records from [facility]    Type 2 diabetes mellitus (HCC)     Past Surgical History:  Procedure Laterality Date   COLONOSCOPY WITH PROPOFOL N/A 11/01/2018   Procedure: COLONOSCOPY WITH PROPOFOL;  Surgeon: Jeani Hawking, MD;  [facility]: [facility];  Service: Endoscopy;  Laterality: N/A;   KNEE ARTHROSCOPY Right 2005   Dr Luther Hearing   POLYPECTOMY  11/01/2018   Procedure:  POLYPECTOMY;  Surgeon: Jeani Hawking, MD;  Location: [facility];  Service: Endoscopy;;    Current Outpatient Medications  Medication Sig Dispense Refill Last Dose   amLODipine (NORVASC) 10 MG tablet Take 1 tablet by mouth once daily 90 tablet 1    atorvastatin (LIPITOR) 20 MG tablet Take 1 tablet by mouth once daily 90 tablet 1    benzonatate (TESSALON) 100 MG capsule Take 1 capsule (100 mg total) by mouth every 8 (eight) hours as needed for cough. 21 capsule 0    hydrochlorothiazide (HYDRODIURIL) 25 MG tablet Take 1 tablet by mouth once daily 90 tablet 0    JARDIANCE 10 MG TABS tablet TAKE 1 TABLET BY MOUTH ONCE DAILY BEFORE BREAKFAST 90 tablet 0    lisinopril (ZESTRIL) 10 MG tablet Take 1 tablet by mouth once daily 90 tablet 0    metFORMIN (GLUCOPHAGE) 1000 MG tablet TAKE 1 TABLET BY MOUTH TWICE DAILY WITH A MEAL 180 tablet 1    naproxen sodium (ALEVE) 220 MG tablet Take 220 mg by mouth 2 (two) times daily as needed (pain.).      No current facility-administered medications for this visit.   No Known Allergies  Social History   Tobacco Use   Smoking status: Former    Current packs/day: 0.00    Types: Cigarettes    Quit date: 12/02/2004    Years since quitting: 18.2   Smokeless tobacco: Never   Tobacco comments:    8-10 a day off/on for 30 years  Substance Use Topics  Alcohol use: Not Currently    Comment: quit 2005    No family history on file.   Review of Systems  Musculoskeletal:  Positive for arthralgias.  All other systems reviewed and are negative.   Objective:  Physical Exam Constitutional:      General: He is not in acute distress.    Appearance: Normal appearance. He is normal weight.  HENT:     Head: Normocephalic and atraumatic.  Eyes:     Conjunctiva/sclera: Conjunctivae normal.     Pupils: Pupils are equal, round, and reactive to light.     Comments: Legally blind  Cardiovascular:     Rate and Rhythm: Normal rate and regular rhythm.     Pulses:  Normal pulses.     Heart sounds: Normal heart sounds.  Pulmonary:     Effort: Pulmonary effort is normal. No respiratory distress.     Breath sounds: Normal breath sounds.  Abdominal:     General: Bowel sounds are normal. There is no distension.     Palpations: Abdomen is soft.     Tenderness: There is no abdominal tenderness.  Musculoskeletal:        General: Tenderness present.     Cervical back: Normal range of motion and neck supple.     Comments: TTP over medial and lateral joint line, medial worse than lateral.  No calf tenderness, swelling, or erythema.  No overlying lesions of area of chief complaint.  Decreased strength and ROM due to elicited pain.  Pre-operative ROM 0-100.  Dorsiflexion and plantarflexion intact.  Stable to varus and valgus stress.  BLE appear grossly neurovascularly intact.  Gait mildly antalgic.   Lymphadenopathy:     Cervical: No cervical adenopathy.  Skin:    General: Skin is warm and dry.     Capillary Refill: Capillary refill takes less than 2 seconds.     Findings: No erythema or rash.  Neurological:     General: No focal deficit present.     Mental Status: He is alert and oriented to person, place, and time.  Psychiatric:        Mood and Affect: Mood normal.        Behavior: Behavior normal.     Vital signs in last 24 hours: @VSRANGES @  Labs:   Estimated body mass index is 40.12 kg/m as calculated from the following:   Height as of 11/24/22: 6\' 3"  (1.905 m).   Weight as of 11/24/22: 145.6 kg.   Imaging Review Plain radiographs demonstrate severe degenerative joint disease of the left knee(s). The overall alignment issignificant varus. The bone quality appears to be fair for age and reported activity level.      Assessment/Plan:  End stage arthritis, left knee   The patient history, physical examination, clinical judgment of the provider and imaging studies are consistent with end stage degenerative joint disease of the left knee(s)  and total knee arthroplasty is deemed medically necessary. The treatment options including medical management, injection therapy arthroscopy and arthroplasty were discussed at length. The risks and benefits of total knee arthroplasty were presented and reviewed. The risks due to aseptic loosening, infection, stiffness, patella tracking problems, thromboembolic complications and other imponderables were discussed. The patient acknowledged the explanation, agreed to proceed with the plan and consent was signed. Patient is being admitted for inpatient treatment for surgery, pain control, PT, OT, prophylactic antibiotics, VTE prophylaxis, progressive ambulation and ADL's and discharge planning. The patient is planning to be discharged home with home health  services ([facility]).    Anticipated LOS equal to or greater than 2 midnights due to - Age 35 and older with one or more of the following:  - Obesity  - Expected need for hospital services (PT, OT, Nursing) required for safe  discharge  - Anticipated need for postoperative skilled nursing care or inpatient rehab  - Active co-morbidities: Diabetes and legally blind OR   - Unanticipated findings during/Post Surgery: None  - Patient is a high risk of re-admission due to: Barriers to post-acute care (logistical, no family support in home)

## 2023-02-20 NOTE — Patient Instructions (Signed)
SURGICAL WAITING ROOM VISITATION Patients having surgery or a procedure may have no more than 2 support people in the waiting area - these visitors may rotate.    Children under the age of 3 must have an adult with them who is not the patient.  If the patient needs to stay at the hospital during part of their recovery, the visitor guidelines for inpatient rooms apply. Pre-op nurse will coordinate an appropriate time for 1 support person to accompany patient in pre-op.  This support person may not rotate.    Please refer to the [facility] website for the visitor guidelines for Inpatients (after your surgery is over and you are in a regular room).       Your procedure is scheduled on: 03-07-23   Report to [facility] Main Entrance    Report to admitting at 9:00 AM   Call this number if you have problems the morning of surgery [phone]   Do not eat food :After Midnight.   After Midnight you may have the following liquids until 8:30 AM DAY OF SURGERY  Water Non-Citrus Juices (without pulp, NO RED-Apple, White grape, White cranberry) Black Coffee (NO MILK/CREAM OR CREAMERS, sugar ok)  Clear Tea (NO MILK/CREAM OR CREAMERS, sugar ok) regular and decaf                             Plain Jell-O (NO RED)                                           Fruit ices (not with fruit pulp, NO RED)                                     Popsicles (NO RED)                                                               Sports drinks like Gatorade (NO RED)                   The day of surgery:  Drink ONE (1) Pre-Surgery G2 by 8:30 AM the morning of surgery. Drink in one sitting. Do not sip.  This drink was given to you during your hospital  pre-op appointment visit. Nothing else to drink after completing the Pre-Surgery G2.          If you have questions, please contact your surgeon's office.   FOLLOW  ANY ADDITIONAL PRE OP INSTRUCTIONS YOU RECEIVED FROM YOUR SURGEON'S OFFICE!!!      Oral Hygiene is also important to reduce your risk of infection.                                    Remember - BRUSH YOUR TEETH THE MORNING OF SURGERY WITH YOUR REGULAR TOOTHPASTE   Do NOT smoke after Midnight   Take these medicines the morning of surgery with A SIP OF WATER:   Amlodipine  Atorvastatin   Stop all  vitamins and herbal supplements 7 days before surgery  How to Manage Your Diabetes Before and After Surgery  Why is it important to control my blood sugar before and after surgery? Improving blood sugar levels before and after surgery helps healing and can limit problems. A way of improving blood sugar control is eating a healthy diet by:  Eating less sugar and carbohydrates  Increasing activity/exercise  Talking with your doctor about reaching your blood sugar goals High blood sugars (greater than 180 mg/dL) can raise your risk of infections and slow your recovery, so you will need to focus on controlling your diabetes during the weeks before surgery. Make sure that the doctor who takes care of your diabetes knows about your planned surgery including the date and location.  How do I manage my blood sugar before surgery? Check your blood sugar at least 4 times a day, starting 2 days before surgery, to make sure that the level is not too high or low. Check your blood sugar the morning of your surgery when you wake up and every 2 hours until you get to the Short Stay unit. If your blood sugar is less than 70 mg/dL, you will need to treat for low blood sugar: Do not take insulin. Treat a low blood sugar (less than 70 mg/dL) with  cup of clear juice (cranberry or apple), 4 glucose tablets, OR glucose gel. Recheck blood sugar in 15 minutes after treatment (to make sure it is greater than 70 mg/dL). If your blood sugar is not greater than 70 mg/dL on recheck, call [phone] for further instructions. Report your blood sugar to the short stay nurse when you get to Short  Stay.  If you are admitted to the hospital after surgery: Your blood sugar will be checked by the staff and you will probably be given insulin after surgery (instead of oral diabetes medicines) to make sure you have good blood sugar levels. The goal for blood sugar control after surgery is 80-180 mg/dL.   WHAT DO I DO ABOUT MY DIABETES MEDICATION?  Do not take oral diabetes medicines (pills) the morning of surgery.  Hold Jardiance 3 days before surgery (do not take after 03-03-23)  DO NOT TAKE THE FOLLOWING 7 DAYS PRIOR TO SURGERY: Ozempic, Wegovy, Rybelsus (Semaglutide), Byetta (exenatide), Bydureon (exenatide ER), Victoza, Saxenda (liraglutide), or Trulicity (dulaglutide) Mounjaro (Tirzepatide) Adlyxin (Lixisenatide), Polyethylene Glycol Loxenatide.  Reviewed and Endorsed by [facility] Patient Education Committee, August 2015  Bring CPAP mask and tubing day of surgery.                              You may not have any metal on your body including  jewelry, and body piercing             Do not wear  lotions, powders, cologne, or deodorant              Men may shave face and neck.   Do not bring valuables to the hospital. [facility] IS NOT RESPONSIBLE   FOR VALUABLES.   Contacts, dentures or bridgework may not be worn into surgery.   Bring small overnight bag day of surgery.   DO NOT BRING YOUR HOME MEDICATIONS TO THE HOSPITAL. PHARMACY WILL DISPENSE MEDICATIONS LISTED ON YOUR MEDICATION LIST TO YOU DURING YOUR ADMISSION IN THE HOSPITAL!    Special Instructions: Bring a copy of your healthcare power of attorney and living will documents  the day of surgery if you haven't scanned them before.              Please read over the following fact sheets you were given: IF YOU HAVE QUESTIONS ABOUT YOUR PRE-OP INSTRUCTIONS PLEASE CALL [phone] Gwen  If you received a COVID test during your pre-op visit  it is requested that you wear a mask when out in public, stay away from anyone  that may not be feeling well and notify your surgeon if you develop symptoms. If you test positive for Covid or have been in contact with anyone that has tested positive in the last 10 days please notify you surgeon.    Pre-operative 5 CHG Bath Instructions   You can play a key role in reducing the risk of infection after surgery. Your skin needs to be as free of germs as possible. You can reduce the number of germs on your skin by washing with CHG (chlorhexidine gluconate) soap before surgery. CHG is an antiseptic soap that kills germs and continues to kill germs even after washing.   DO NOT use if you have an allergy to chlorhexidine/CHG or antibacterial soaps. If your skin becomes reddened or irritated, stop using the CHG and notify one of our RNs at [phone].   Please shower with the CHG soap starting 4 days before surgery using the following schedule:     Please keep in mind the following:  DO NOT shave, including legs and underarms, starting the day of your first shower.   You may shave your face at any point before/day of surgery.  Place clean sheets on your bed the day you start using CHG soap. Use a clean washcloth (not used since being washed) for each shower. DO NOT sleep with pets once you start using the CHG.   CHG Shower Instructions:  If you choose to wash your hair and private area, wash first with your normal shampoo/soap.  After you use shampoo/soap, rinse your hair and body thoroughly to remove shampoo/soap residue.  Turn the water OFF and apply about 3 tablespoons (45 ml) of CHG soap to a CLEAN washcloth.  Apply CHG soap ONLY FROM YOUR NECK DOWN TO YOUR TOES (washing for 3-5 minutes)  DO NOT use CHG soap on face, private areas, open wounds, or sores.  Pay special attention to the area where your surgery is being performed.  If you are having back surgery, having someone wash your back for you may be helpful. Wait 2 minutes after CHG soap is applied, then you may  rinse off the CHG soap.  Pat dry with a clean towel  Put on clean clothes/pajamas   If you choose to wear lotion, please use ONLY the CHG-compatible lotions on the back of this paper.     Additional instructions for the day of surgery: DO NOT APPLY any lotions, deodorants, cologne, or perfumes.   Put on clean/comfortable clothes.  Brush your teeth.  Ask your nurse before applying any prescription medications to the skin.      CHG Compatible Lotions   Aveeno Moisturizing lotion  Cetaphil Moisturizing Cream  Cetaphil Moisturizing Lotion  Clairol Herbal Essence Moisturizing Lotion, Dry Skin  Clairol Herbal Essence Moisturizing Lotion, Extra Dry Skin  Clairol Herbal Essence Moisturizing Lotion, Normal Skin  Curel Age Defying Therapeutic Moisturizing Lotion with Alpha Hydroxy  Curel Extreme Care Body Lotion  Curel Soothing Hands Moisturizing Hand Lotion  Curel Therapeutic Moisturizing Cream, Fragrance-Free  Curel Therapeutic Moisturizing Lotion, Fragrance-Free  Curel  Therapeutic Moisturizing Lotion, Original Formula  Eucerin Daily Replenishing Lotion  Eucerin Dry Skin Therapy Plus Alpha Hydroxy Crme  Eucerin Dry Skin Therapy Plus Alpha Hydroxy Lotion  Eucerin Original Crme  Eucerin Original Lotion  Eucerin Plus Crme Eucerin Plus Lotion  Eucerin TriLipid Replenishing Lotion  Keri Anti-Bacterial Hand Lotion  Keri Deep Conditioning Original Lotion Dry Skin Formula Softly Scented  Keri Deep Conditioning Original Lotion, Fragrance Free Sensitive Skin Formula  Keri Lotion Fast Absorbing Fragrance Free Sensitive Skin Formula  Keri Lotion Fast Absorbing Softly Scented Dry Skin Formula  Keri Original Lotion  Keri Skin Renewal Lotion Keri Silky Smooth Lotion  Keri Silky Smooth Sensitive Skin Lotion  Nivea Body Creamy Conditioning Oil  Nivea Body Extra Enriched Lotion  Nivea Body Original Lotion  Nivea Body Sheer Moisturizing Lotion Nivea Crme  Nivea Skin Firming Lotion   NutraDerm 30 Skin Lotion  NutraDerm Skin Lotion  NutraDerm Therapeutic Skin Cream  NutraDerm Therapeutic Skin Lotion  ProShield Protective Hand Cream  Provon moisturizing lotion   PATIENT SIGNATURE_________________________________  NURSE SIGNATURE__________________________________  ________________________________________________________________________    Rogelia Mire  An incentive spirometer is a tool that can help keep your lungs clear and active. This tool measures how well you are filling your lungs with each breath. Taking long deep breaths may help reverse or decrease the chance of developing breathing (pulmonary) problems (especially infection) following: A long period of time when you are unable to move or be active. BEFORE THE PROCEDURE  If the spirometer includes an indicator to show your best effort, your nurse or respiratory therapist will set it to a desired goal. If possible, sit up straight or lean slightly forward. Try not to slouch. Hold the incentive spirometer in an upright position. INSTRUCTIONS FOR USE  Sit on the edge of your bed if possible, or sit up as far as you can in bed or on a chair. Hold the incentive spirometer in an upright position. Breathe out normally. Place the mouthpiece in your mouth and seal your lips tightly around it. Breathe in slowly and as deeply as possible, raising the piston or the ball toward the top of the column. Hold your breath for 3-5 seconds or for as long as possible. Allow the piston or ball to fall to the bottom of the column. Remove the mouthpiece from your mouth and breathe out normally. Rest for a few seconds and repeat Steps 1 through 7 at least 10 times every 1-2 hours when you are awake. Take your time and take a few normal breaths between deep breaths. The spirometer may include an indicator to show your best effort. Use the indicator as a goal to work toward during each repetition. After each set of 10 deep  breaths, practice coughing to be sure your lungs are clear. If you have an incision (the cut made at the time of surgery), support your incision when coughing by placing a pillow or rolled up towels firmly against it. Once you are able to get out of bed, walk around indoors and cough well. You may stop using the incentive spirometer when instructed by your caregiver.  RISKS AND COMPLICATIONS Take your time so you do not get dizzy or light-headed. If you are in pain, you may need to take or ask for pain medication before doing incentive spirometry. It is harder to take a deep breath if you are having pain. AFTER USE Rest and breathe slowly and easily. It can be helpful to keep track of a log of  your progress. Your caregiver can provide you with a simple table to help with this. If you are using the spirometer at home, follow these instructions: SEEK MEDICAL CARE IF:  You are having difficultly using the spirometer. You have trouble using the spirometer as often as instructed. Your pain medication is not giving enough relief while using the spirometer. You develop fever of 100.5 F (38.1 C) or higher. SEEK IMMEDIATE MEDICAL CARE IF:  You cough up bloody sputum that had not been present before. You develop fever of 102 F (38.9 C) or greater. You develop worsening pain at or near the incision site. MAKE SURE YOU:  Understand these instructions. Will watch your condition. Will get help right away if you are not doing well or get worse. Document Released: 07/31/2006 Document Revised: 06/12/2011 Document Reviewed: 10/01/2006 [facility] Patient Information 2014 [place], [place].   ________________________________________________________________________

## 2023-02-20 NOTE — Progress Notes (Signed)
COVID Vaccine Completed:  Yes  Date of COVID positive in last 31 days:No  PCP - Abbey Chatters, NP Cardiologist - N/A  Chest x-ray -  N/A EKG - 10-09-22 Epic Stress Test - Yes, several years ago ECHO -  N/A Cardiac Cath -  N/A Pacemaker/ICD device last checked: Spinal Cord Stimulator: N/A  Bowel Prep - N/A  Sleep Study - N/A CPAP -   Fasting Blood Sugar -  Checks Blood Sugar - does not check   Last dose of GLP1 agonist-  N/A GLP1 instructions:  Hold 7 days before surgery    Jardiance Last dose of SGLT-2 inhibitors-  03-03-23 SGLT-2 instructions:  Hold 3 days before surgery   Blood Thinner Instructions:   N/A Aspirin Instructions: Last Dose:  Activity level:  Can go up a flight of stairs and perform activities of daily living without stopping and without symptoms of chest pain or shortness of breath.  Anesthesia review:  N/A  Patient denies shortness of breath, fever, cough and chest pain at PAT appointment  Patient verbalized understanding of instructions that were given to them at the PAT appointment. Patient was also instructed that they will need to review over the PAT instructions again at home before surgery.

## 2023-02-22 ENCOUNTER — Other Ambulatory Visit: Payer: Self-pay

## 2023-02-22 ENCOUNTER — Encounter (HOSPITAL_COMMUNITY)
Admission: RE | Admit: 2023-02-22 | Discharge: 2023-02-22 | Disposition: A | Discharge status: Home / Self Care | Payer: Medicare Other | Source: Ambulatory Visit | Attending: Orthopedic Surgery | Admitting: Orthopedic Surgery

## 2023-02-22 ENCOUNTER — Encounter (HOSPITAL_COMMUNITY): Payer: Self-pay

## 2023-02-22 VITALS — BP 128/75 | HR 78 | Temp 98.1°F | Resp 16 | Ht 75.0 in | Wt 319.7 lb

## 2023-02-22 DIAGNOSIS — E119 Type 2 diabetes mellitus without complications: Secondary | ICD-10-CM | POA: Diagnosis not present

## 2023-02-22 DIAGNOSIS — M25562 Pain in left knee: Secondary | ICD-10-CM | POA: Insufficient documentation

## 2023-02-22 DIAGNOSIS — Z01818 Encounter for other preprocedural examination: Secondary | ICD-10-CM | POA: Diagnosis not present

## 2023-02-22 DIAGNOSIS — G8929 Other chronic pain: Secondary | ICD-10-CM | POA: Insufficient documentation

## 2023-02-22 DIAGNOSIS — Z01812 Encounter for preprocedural laboratory examination: Secondary | ICD-10-CM | POA: Insufficient documentation

## 2023-02-22 LAB — COMPREHENSIVE METABOLIC PANEL
ALT: 9 U/L (ref 0–44)
AST: 17 U/L (ref 15–41)
Albumin: 3.9 g/dL (ref 3.5–5.0)
Alkaline Phosphatase: 103 U/L (ref 38–126)
Anion gap: 8 (ref 5–15)
BUN: 13 mg/dL (ref 8–23)
CO2: 28 mmol/L (ref 22–32)
Calcium: 9.5 mg/dL (ref 8.9–10.3)
Chloride: 99 mmol/L (ref 98–111)
Creatinine, Ser: 0.9 mg/dL (ref 0.61–1.24)
GFR, Estimated: >60 mL/min (ref >60)
Glucose, Bld: 99 mg/dL (ref 70–99)
Potassium: 3.7 mmol/L (ref 3.5–5.1)
Sodium: 135 mmol/L (ref 135–145)
Total Bilirubin: 0.4 mg/dL (ref <1.2)
Total Protein: 7.8 g/dL (ref 6.5–8.1)

## 2023-02-22 LAB — CBC WITH DIFFERENTIAL/PLATELET
Abs Immature Granulocytes: 0.02 10*3/uL (ref 0.00–0.07)
Basophils Absolute: 0 10*3/uL (ref 0.0–0.1)
Basophils Relative: 0 %
Eosinophils Absolute: 0.1 10*3/uL (ref 0.0–0.5)
Eosinophils Relative: 1 %
HCT: 41.4 % (ref 39.0–52.0)
Hemoglobin: 12.1 g/dL — ABNORMAL LOW (ref 13.0–17.0)
Immature Granulocytes: 0 %
Lymphocytes Relative: 53 %
Lymphs Abs: 4.8 10*3/uL — ABNORMAL HIGH (ref 0.7–4.0)
MCH: 23.4 pg — ABNORMAL LOW (ref 26.0–34.0)
MCHC: 29.2 g/dL — ABNORMAL LOW (ref 30.0–36.0)
MCV: 80.1 fL (ref 80.0–100.0)
Monocytes Absolute: 0.6 10*3/uL (ref 0.1–1.0)
Monocytes Relative: 7 %
Neutro Abs: 3.5 10*3/uL (ref 1.7–7.7)
Neutrophils Relative %: 39 %
Platelets: 334 10*3/uL (ref 150–400)
RBC: 5.17 MIL/uL (ref 4.22–5.81)
RDW: 15.3 % (ref 11.5–15.5)
WBC: 9 10*3/uL (ref 4.0–10.5)
nRBC: 0 % (ref 0.0–0.2)

## 2023-02-22 LAB — SURGICAL PCR SCREEN
MRSA, PCR: NEGATIVE
Staphylococcus aureus: NEGATIVE

## 2023-02-22 LAB — TYPE AND SCREEN
ABO/RH(D): O POS
Antibody Screen: NEGATIVE

## 2023-02-22 LAB — HEMOGLOBIN A1C
Hgb A1c MFr Bld: 6.4 % — ABNORMAL HIGH (ref 4.8–5.6)
Mean Plasma Glucose: 136.98 mg/dL

## 2023-02-22 LAB — GLUCOSE, CAPILLARY: Glucose-Capillary: 112 mg/dL — ABNORMAL HIGH (ref 70–99)

## 2023-03-07 ENCOUNTER — Ambulatory Visit (HOSPITAL_COMMUNITY): Payer: Medicare Other | Admitting: Anesthesiology

## 2023-03-07 ENCOUNTER — Observation Stay (HOSPITAL_COMMUNITY)
Admission: RE | Admit: 2023-03-07 | Discharge: 2023-03-08 | Disposition: A | Discharge status: Home Health | Payer: Medicare Other | Attending: Orthopedic Surgery | Admitting: Orthopedic Surgery

## 2023-03-07 ENCOUNTER — Observation Stay (HOSPITAL_COMMUNITY): Payer: Medicare Other

## 2023-03-07 ENCOUNTER — Encounter (HOSPITAL_COMMUNITY)
Admission: RE | Disposition: A | Discharge status: Home Health | Payer: Self-pay | Source: Home / Self Care | Attending: Orthopedic Surgery

## 2023-03-07 ENCOUNTER — Other Ambulatory Visit: Payer: Self-pay

## 2023-03-07 ENCOUNTER — Encounter (HOSPITAL_COMMUNITY): Payer: Self-pay | Admitting: Orthopedic Surgery

## 2023-03-07 DIAGNOSIS — M1712 Unilateral primary osteoarthritis, left knee: Principal | ICD-10-CM | POA: Insufficient documentation

## 2023-03-07 DIAGNOSIS — Z79899 Other long term (current) drug therapy: Secondary | ICD-10-CM | POA: Diagnosis not present

## 2023-03-07 DIAGNOSIS — Z7984 Long term (current) use of oral hypoglycemic drugs: Secondary | ICD-10-CM | POA: Insufficient documentation

## 2023-03-07 DIAGNOSIS — Z87891 Personal history of nicotine dependence: Secondary | ICD-10-CM | POA: Insufficient documentation

## 2023-03-07 DIAGNOSIS — Z96652 Presence of left artificial knee joint: Secondary | ICD-10-CM | POA: Diagnosis not present

## 2023-03-07 DIAGNOSIS — E119 Type 2 diabetes mellitus without complications: Secondary | ICD-10-CM | POA: Diagnosis not present

## 2023-03-07 DIAGNOSIS — M25462 Effusion, left knee: Secondary | ICD-10-CM | POA: Diagnosis not present

## 2023-03-07 DIAGNOSIS — Z471 Aftercare following joint replacement surgery: Secondary | ICD-10-CM | POA: Diagnosis not present

## 2023-03-07 DIAGNOSIS — I10 Essential (primary) hypertension: Secondary | ICD-10-CM | POA: Insufficient documentation

## 2023-03-07 DIAGNOSIS — G8918 Other acute postprocedural pain: Secondary | ICD-10-CM | POA: Diagnosis not present

## 2023-03-07 HISTORY — PX: TOTAL KNEE ARTHROPLASTY: SHX125

## 2023-03-07 LAB — GLUCOSE, CAPILLARY
Glucose-Capillary: 107 mg/dL — ABNORMAL HIGH (ref 70–99)
Glucose-Capillary: 85 mg/dL (ref 70–99)
Glucose-Capillary: 111 mg/dL — ABNORMAL HIGH (ref 70–99)
Glucose-Capillary: 123 mg/dL — ABNORMAL HIGH (ref 70–99)
Glucose-Capillary: 189 mg/dL — ABNORMAL HIGH (ref 70–99)

## 2023-03-07 SURGERY — ARTHROPLASTY, KNEE, TOTAL
Anesthesia: Spinal | Site: Knee | Laterality: Left

## 2023-03-07 MED ORDER — DEXAMETHASONE SODIUM PHOSPHATE 4 MG/ML IJ SOLN: 4.0000 mg | Freq: Once | INTRAMUSCULAR | Status: DC

## 2023-03-07 MED ORDER — ROPIVACAINE HCL 5 MG/ML IJ SOLN
INTRAMUSCULAR | Status: DC | PRN
  Administered 2023-03-07: 20 mL via PERINEURAL

## 2023-03-07 MED ORDER — BUPIVACAINE LIPOSOME 1.3 % IJ SUSP
INTRAMUSCULAR | Status: AC
  Filled 2023-03-07: qty 20

## 2023-03-07 MED ORDER — SODIUM CHLORIDE (PF) 0.9 % IJ SOLN
INTRAMUSCULAR | Status: DC | PRN
  Administered 2023-03-07: 80 mL

## 2023-03-07 MED ORDER — PROPOFOL 10 MG/ML IV BOLUS
INTRAVENOUS | Status: DC | PRN
  Administered 2023-03-07: 40 mg via INTRAVENOUS
  Administered 2023-03-07 (×2): 30 mg via INTRAVENOUS
  Administered 2023-03-07: 40 mg via INTRAVENOUS
  Administered 2023-03-07 (×2): 30 mg via INTRAVENOUS

## 2023-03-07 MED ORDER — ONDANSETRON HCL 4 MG PO TABS: 4.0000 mg | ORAL_TABLET | Freq: Three times a day (TID) | ORAL | 0 refills | Status: AC | PRN

## 2023-03-07 MED ORDER — FENTANYL CITRATE PF 50 MCG/ML IJ SOSY
25.0000 ug | PREFILLED_SYRINGE | INTRAMUSCULAR | Status: DC | PRN
  Administered 2023-03-07: 50 ug via INTRAVENOUS

## 2023-03-07 MED ORDER — MENTHOL 3 MG MT LOZG: 1.0000 | LOZENGE | OROMUCOSAL | Status: DC | PRN

## 2023-03-07 MED ORDER — KETOROLAC TROMETHAMINE 15 MG/ML IJ SOLN
7.5000 mg | Freq: Four times a day (QID) | INTRAMUSCULAR | Status: DC
  Administered 2023-03-07 – 2023-03-08 (×3): 7.5 mg via INTRAVENOUS
  Filled 2023-03-07 (×3): qty 1

## 2023-03-07 MED ORDER — INSULIN ASPART 100 UNIT/ML IJ SOLN: 0.0000 [IU] | INTRAMUSCULAR | Status: DC | PRN

## 2023-03-07 MED ORDER — ONDANSETRON HCL 4 MG PO TABS: 4.0000 mg | ORAL_TABLET | Freq: Four times a day (QID) | ORAL | Status: DC | PRN

## 2023-03-07 MED ORDER — BUPIVACAINE LIPOSOME 1.3 % IJ SUSP: 20.0000 mL | Freq: Once | INTRAMUSCULAR | Status: DC

## 2023-03-07 MED ORDER — DEXAMETHASONE SODIUM PHOSPHATE 10 MG/ML IJ SOLN
INTRAMUSCULAR | Status: DC | PRN
  Administered 2023-03-07: 8 mg via INTRAVENOUS

## 2023-03-07 MED ORDER — OXYCODONE HCL 5 MG PO TABS
5.0000 mg | ORAL_TABLET | ORAL | 0 refills | Status: AC | PRN
Start: 2023-03-07 — End: 2023-03-14

## 2023-03-07 MED ORDER — ONDANSETRON HCL 4 MG/2ML IJ SOLN
INTRAMUSCULAR | Status: AC
  Filled 2023-03-07: qty 2

## 2023-03-07 MED ORDER — DIPHENHYDRAMINE HCL 12.5 MG/5ML PO ELIX: 12.5000 mg | ORAL_SOLUTION | ORAL | Status: DC | PRN

## 2023-03-07 MED ORDER — BUPIVACAINE-EPINEPHRINE 0.25% -1:200000 IJ SOLN
INTRAMUSCULAR | Status: AC
  Filled 2023-03-07: qty 1

## 2023-03-07 MED ORDER — CHLORHEXIDINE GLUCONATE 0.12 % MT SOLN
15.0000 mL | Freq: Once | OROMUCOSAL | Status: AC
  Administered 2023-03-07: 15 mL via OROMUCOSAL

## 2023-03-07 MED ORDER — ACETAMINOPHEN 500 MG PO TABS: 1000.0000 mg | ORAL_TABLET | Freq: Three times a day (TID) | ORAL | Status: AC | PRN

## 2023-03-07 MED ORDER — POVIDONE-IODINE 10 % EX SWAB: 2.0000 | Freq: Once | CUTANEOUS | Status: DC

## 2023-03-07 MED ORDER — POLYETHYLENE GLYCOL 3350 17 G PO PACK: 17.0000 g | PACK | Freq: Every day | ORAL | Status: DC | PRN

## 2023-03-07 MED ORDER — PANTOPRAZOLE SODIUM 40 MG PO TBEC
40.0000 mg | DELAYED_RELEASE_TABLET | Freq: Every day | ORAL | Status: DC
  Administered 2023-03-07 – 2023-03-08 (×2): 40 mg via ORAL
  Filled 2023-03-07 (×2): qty 1

## 2023-03-07 MED ORDER — ACETAMINOPHEN 500 MG PO TABS
1000.0000 mg | ORAL_TABLET | Freq: Once | ORAL | Status: AC
  Administered 2023-03-07: 1000 mg via ORAL
  Filled 2023-03-07: qty 2

## 2023-03-07 MED ORDER — HYDROMORPHONE HCL 1 MG/ML IJ SOLN
0.5000 mg | INTRAMUSCULAR | Status: DC | PRN
  Administered 2023-03-07: 0.5 mg via INTRAVENOUS
  Filled 2023-03-07: qty 1

## 2023-03-07 MED ORDER — LACTATED RINGERS IV SOLN: INTRAVENOUS | Status: AC

## 2023-03-07 MED ORDER — PROPOFOL 500 MG/50ML IV EMUL
INTRAVENOUS | Status: DC | PRN
  Administered 2023-03-07: 75 ug/kg/min via INTRAVENOUS

## 2023-03-07 MED ORDER — ORAL CARE MOUTH RINSE: 15.0000 mL | Freq: Once | OROMUCOSAL | Status: AC

## 2023-03-07 MED ORDER — LIDOCAINE HCL (CARDIAC) PF 100 MG/5ML IV SOSY
PREFILLED_SYRINGE | INTRAVENOUS | Status: DC | PRN
  Administered 2023-03-07: 30 mg via INTRATRACHEAL

## 2023-03-07 MED ORDER — CELECOXIB 100 MG PO CAPS: 100.0000 mg | ORAL_CAPSULE | Freq: Two times a day (BID) | ORAL | 0 refills | Status: AC

## 2023-03-07 MED ORDER — METHOCARBAMOL 500 MG PO TABS
500.0000 mg | ORAL_TABLET | Freq: Four times a day (QID) | ORAL | Status: DC | PRN
  Administered 2023-03-07 (×2): 500 mg via ORAL
  Filled 2023-03-07 (×2): qty 1

## 2023-03-07 MED ORDER — TRANEXAMIC ACID-NACL 1000-0.7 MG/100ML-% IV SOLN
1000.0000 mg | INTRAVENOUS | Status: AC
  Administered 2023-03-07: 1000 mg via INTRAVENOUS
  Filled 2023-03-07: qty 100

## 2023-03-07 MED ORDER — FENTANYL CITRATE PF 50 MCG/ML IJ SOSY: 50.0000 ug | PREFILLED_SYRINGE | INTRAMUSCULAR | Status: DC

## 2023-03-07 MED ORDER — PHENOL 1.4 % MT LIQD: 1.0000 | OROMUCOSAL | Status: DC | PRN

## 2023-03-07 MED ORDER — CLONIDINE HCL (ANALGESIA) 100 MCG/ML EP SOLN
EPIDURAL | Status: DC | PRN
  Administered 2023-03-07: 50 ug

## 2023-03-07 MED ORDER — SODIUM CHLORIDE 0.9 % IR SOLN
Status: DC | PRN
  Administered 2023-03-07: 3000 mL

## 2023-03-07 MED ORDER — INSULIN ASPART 100 UNIT/ML IJ SOLN: 0.0000 [IU] | Freq: Three times a day (TID) | INTRAMUSCULAR | Status: DC

## 2023-03-07 MED ORDER — ASPIRIN 81 MG PO CHEW
81.0000 mg | CHEWABLE_TABLET | Freq: Two times a day (BID) | ORAL | Status: DC
  Administered 2023-03-07 – 2023-03-08 (×2): 81 mg via ORAL
  Filled 2023-03-07 (×2): qty 1

## 2023-03-07 MED ORDER — BUPIVACAINE IN DEXTROSE 0.75-8.25 % IT SOLN
INTRATHECAL | Status: DC | PRN
  Administered 2023-03-07: 1.8 mL via INTRATHECAL

## 2023-03-07 MED ORDER — METHOCARBAMOL 1000 MG/10ML IJ SOLN: 500.0000 mg | Freq: Four times a day (QID) | INTRAMUSCULAR | Status: DC | PRN

## 2023-03-07 MED ORDER — DOCUSATE SODIUM 100 MG PO CAPS
100.0000 mg | ORAL_CAPSULE | Freq: Two times a day (BID) | ORAL | Status: DC
  Administered 2023-03-07 – 2023-03-08 (×2): 100 mg via ORAL
  Filled 2023-03-07 (×2): qty 1

## 2023-03-07 MED ORDER — DEXMEDETOMIDINE HCL IN NACL 80 MCG/20ML IV SOLN
INTRAVENOUS | Status: DC | PRN
  Administered 2023-03-07 (×4): 4 ug via INTRAVENOUS

## 2023-03-07 MED ORDER — ONDANSETRON HCL 4 MG/2ML IJ SOLN: 4.0000 mg | Freq: Four times a day (QID) | INTRAMUSCULAR | Status: DC | PRN

## 2023-03-07 MED ORDER — ACETAMINOPHEN 500 MG PO TABS
1000.0000 mg | ORAL_TABLET | Freq: Four times a day (QID) | ORAL | Status: AC
  Administered 2023-03-07 – 2023-03-08 (×4): 1000 mg via ORAL
  Filled 2023-03-07 (×4): qty 2

## 2023-03-07 MED ORDER — SODIUM CHLORIDE (PF) 0.9 % IJ SOLN
INTRAMUSCULAR | Status: AC
  Filled 2023-03-07: qty 30

## 2023-03-07 MED ORDER — CEFAZOLIN SODIUM-DEXTROSE 2-4 GM/100ML-% IV SOLN
2.0000 g | Freq: Four times a day (QID) | INTRAVENOUS | Status: AC
  Administered 2023-03-07 (×2): 2 g via INTRAVENOUS
  Filled 2023-03-07 (×2): qty 100

## 2023-03-07 MED ORDER — AMISULPRIDE (ANTIEMETIC) 5 MG/2ML IV SOLN: 10.0000 mg | Freq: Once | INTRAVENOUS | Status: DC | PRN

## 2023-03-07 MED ORDER — OXYCODONE HCL 5 MG PO TABS
5.0000 mg | ORAL_TABLET | ORAL | Status: DC | PRN
  Administered 2023-03-07 – 2023-03-08 (×2): 10 mg via ORAL
  Filled 2023-03-07 (×5): qty 2

## 2023-03-07 MED ORDER — PROPOFOL 1000 MG/100ML IV EMUL
INTRAVENOUS | Status: AC
  Filled 2023-03-07: qty 100

## 2023-03-07 MED ORDER — ACETAMINOPHEN 325 MG PO TABS: 325.0000 mg | ORAL_TABLET | Freq: Four times a day (QID) | ORAL | Status: DC | PRN

## 2023-03-07 MED ORDER — ASPIRIN 81 MG PO TBEC: 81.0000 mg | DELAYED_RELEASE_TABLET | Freq: Two times a day (BID) | ORAL | Status: AC

## 2023-03-07 MED ORDER — SURGIPHOR WOUND IRRIGATION SYSTEM - OPTIME
TOPICAL | Status: DC | PRN
  Administered 2023-03-07: 450 mL via TOPICAL

## 2023-03-07 MED ORDER — FENTANYL CITRATE PF 50 MCG/ML IJ SOSY
PREFILLED_SYRINGE | INTRAMUSCULAR | Status: AC
  Administered 2023-03-07: 50 ug via INTRAVENOUS
  Filled 2023-03-07: qty 2

## 2023-03-07 MED ORDER — ISOPROPYL ALCOHOL 70 % SOLN
Status: DC | PRN
  Administered 2023-03-07: 1 via TOPICAL

## 2023-03-07 MED ORDER — DEXMEDETOMIDINE HCL IN NACL 80 MCG/20ML IV SOLN
INTRAVENOUS | Status: AC
  Filled 2023-03-07: qty 20

## 2023-03-07 MED ORDER — ONDANSETRON HCL 4 MG/2ML IJ SOLN
INTRAMUSCULAR | Status: DC | PRN
  Administered 2023-03-07: 4 mg via INTRAVENOUS

## 2023-03-07 MED ORDER — ISOPROPYL ALCOHOL 70 % SOLN
Status: AC
  Filled 2023-03-07: qty 480

## 2023-03-07 MED ORDER — CEFAZOLIN IN SODIUM CHLORIDE 3-0.9 GM/100ML-% IV SOLN
3.0000 g | INTRAVENOUS | Status: AC
  Administered 2023-03-07: 2 g via INTRAVENOUS
  Filled 2023-03-07: qty 100

## 2023-03-07 MED ORDER — 0.9 % SODIUM CHLORIDE (POUR BTL) OPTIME
TOPICAL | Status: DC | PRN
  Administered 2023-03-07: 1000 mL

## 2023-03-07 MED ORDER — LIDOCAINE HCL (PF) 2 % IJ SOLN
INTRAMUSCULAR | Status: AC
  Filled 2023-03-07: qty 5

## 2023-03-07 MED ORDER — SODIUM CHLORIDE 0.9 % IV SOLN: INTRAVENOUS | Status: DC

## 2023-03-07 MED ORDER — METHOCARBAMOL 500 MG PO TABS: 500.0000 mg | ORAL_TABLET | Freq: Three times a day (TID) | ORAL | 0 refills | Status: AC | PRN

## 2023-03-07 MED ORDER — DEXAMETHASONE SODIUM PHOSPHATE 10 MG/ML IJ SOLN
INTRAMUSCULAR | Status: AC
  Filled 2023-03-07: qty 1

## 2023-03-07 MED ORDER — FENTANYL CITRATE PF 50 MCG/ML IJ SOSY
PREFILLED_SYRINGE | INTRAMUSCULAR | Status: AC
  Filled 2023-03-07: qty 3

## 2023-03-07 SURGICAL SUPPLY — 55 items
BAG COUNTER SPONGE SURGICOUNT (BAG) IMPLANT
BLADE SAG 18X100X1.27 (BLADE) ×2 IMPLANT
BLADE SAW SAG 35X64 .89 (BLADE) ×2 IMPLANT
BNDG COHESIVE 3X5 TAN ST LF (GAUZE/BANDAGES/DRESSINGS) ×2 IMPLANT
BNDG ELASTIC 6X10 VLCR STRL LF (GAUZE/BANDAGES/DRESSINGS) ×2 IMPLANT
BOWL SMART MIX CTS (DISPOSABLE) ×2 IMPLANT
CEMENT BONE R 1X40 (Cement) IMPLANT
CEMENT BONE REFOBACIN R1X40 US (Cement) IMPLANT
CHLORAPREP W/TINT 26 (MISCELLANEOUS) ×4 IMPLANT
COMP FEM PS STD KNEE 12 LT (Joint) ×1 IMPLANT
COMP PATELLA 3 PEG 35 (Joint) ×1 IMPLANT
COMP TIB PS G 0D LT (Joint) ×1 IMPLANT
COMPONENT FEM PS STD KN 12 LT (Joint) IMPLANT
COMPONENT PATELLA 3 PEG 35 (Joint) IMPLANT
COMPONET TIB PS G 0D LT (Joint) IMPLANT
COVER SURGICAL LIGHT HANDLE (MISCELLANEOUS) ×2 IMPLANT
CUFF TRNQT CYL 34X4.125X (TOURNIQUET CUFF) ×2 IMPLANT
DERMABOND ADVANCED .7 DNX12 (GAUZE/BANDAGES/DRESSINGS) ×2 IMPLANT
DRAPE INCISE IOBAN 85X60 (DRAPES) ×2 IMPLANT
DRAPE SHEET LG 3/4 BI-LAMINATE (DRAPES) ×2 IMPLANT
DRAPE U-SHAPE 47X51 STRL (DRAPES) ×2 IMPLANT
DRSG AQUACEL AG ADV 3.5X10 (GAUZE/BANDAGES/DRESSINGS) ×2 IMPLANT
ELECT REM PT RETURN 15FT ADLT (MISCELLANEOUS) ×2 IMPLANT
GAUZE SPONGE 4X4 12PLY STRL (GAUZE/BANDAGES/DRESSINGS) ×2 IMPLANT
GLOVE BIO SURGEON STRL SZ 6.5 (GLOVE) ×4 IMPLANT
GLOVE BIOGEL PI IND STRL 6.5 (GLOVE) ×2 IMPLANT
GLOVE BIOGEL PI IND STRL 8 (GLOVE) ×2 IMPLANT
GLOVE SURG ORTHO 8.0 STRL STRW (GLOVE) ×4 IMPLANT
GOWN STRL REUS W/ TWL XL LVL3 (GOWN DISPOSABLE) ×4 IMPLANT
HOLDER FOLEY CATH W/STRAP (MISCELLANEOUS) ×2 IMPLANT
HOOD PEEL AWAY T7 (MISCELLANEOUS) ×6 IMPLANT
KIT TURNOVER KIT A (KITS) IMPLANT
LINER TIB ASF PS GH/12 LT (Liner) IMPLANT
MANIFOLD NEPTUNE II (INSTRUMENTS) ×2 IMPLANT
MARKER SKIN DUAL TIP RULER LAB (MISCELLANEOUS) ×2 IMPLANT
NS IRRIG 1000ML POUR BTL (IV SOLUTION) ×2 IMPLANT
PACK TOTAL KNEE CUSTOM (KITS) ×2 IMPLANT
PIN DRILL HDLS TROCAR 75 4PK (PIN) IMPLANT
SCREW HEADED 33MM KNEE (MISCELLANEOUS) IMPLANT
SET HNDPC FAN SPRY TIP SCT (DISPOSABLE) ×2 IMPLANT
SOLUTION IRRIG SURGIPHOR (IV SOLUTION) IMPLANT
SPIKE FLUID TRANSFER (MISCELLANEOUS) ×2 IMPLANT
STRIP CLOSURE SKIN 1/2X4 (GAUZE/BANDAGES/DRESSINGS) ×2 IMPLANT
SUT MNCRL AB 3-0 PS2 18 (SUTURE) ×2 IMPLANT
SUT STRATAFIX 0 PDS 27 VIOLET (SUTURE) ×1
SUT STRATAFIX 14 PDO 48 VLT (SUTURE) ×2 IMPLANT
SUT STRATAFIX PDO 1 14 VIOLET (SUTURE) ×1
SUT VIC AB 2-0 CT2 27 (SUTURE) ×4 IMPLANT
SUTURE STRATFX 0 PDS 27 VIOLET (SUTURE) ×2 IMPLANT
SYR 50ML LL SCALE MARK (SYRINGE) ×2 IMPLANT
TRAY FOL W/BAG SLVR 16FR STRL (SET/KITS/TRAYS/PACK) IMPLANT
TRAY FOLEY MTR SLVR 14FR STAT (SET/KITS/TRAYS/PACK) IMPLANT
TUBE SUCTION HIGH CAP CLEAR NV (SUCTIONS) ×2 IMPLANT
UNDERPAD 30X36 HEAVY ABSORB (UNDERPADS AND DIAPERS) ×2 IMPLANT
WRAP KNEE MAXI GEL POST OP (GAUZE/BANDAGES/DRESSINGS) IMPLANT

## 2023-03-07 NOTE — Transfer of Care (Signed)
Immediate Anesthesia Transfer of Care Note  Patient: Michael [name].  Procedure(s) Performed: TOTAL KNEE ARTHROPLASTY (Left: Knee)  Patient Location: PACU  Anesthesia Type:MAC and Spinal  Level of Consciousness: awake, alert , and oriented  Airway & Oxygen Therapy: Patient Spontanous Breathing and Patient connected to face mask oxygen  Post-op Assessment: Report given to RN and Post -op Vital signs reviewed and stable  Post vital signs: Reviewed and stable  Last Vitals:  Vitals Value Taken Time  BP 116/74 03/07/23 1521  Temp    Pulse 81 03/07/23 1525  Resp 24 03/07/23 1525  SpO2 92 % 03/07/23 1525  Vitals shown include unfiled device data.  Last Pain:  Vitals:   03/07/23 1118  TempSrc:   PainSc: 0-No pain      Patients Stated Pain Goal: 5 (03/07/23 1007)  Complications: No notable events documented.

## 2023-03-07 NOTE — Anesthesia Preprocedure Evaluation (Signed)
Anesthesia Evaluation  Patient identified by MRN, date of birth, ID band Patient awake    Reviewed: Allergy & Precautions, NPO status , Patient's Chart, lab work & pertinent test results  Airway Mallampati: II  TM Distance: >3 FB Neck ROM: Full    Dental  (+) Dental Advisory Given   Pulmonary former smoker   breath sounds clear to auscultation       Cardiovascular hypertension, Pt. on medications  Rhythm:Regular Rate:Normal     Neuro/Psych Seizures -,     GI/Hepatic negative GI ROS, Neg liver ROS,,,  Endo/Other  diabetes, Type 2  Class 3 obesity  Renal/GU negative Renal ROS     Musculoskeletal  (+) Arthritis ,    Abdominal   Peds  Hematology  (+) Blood dyscrasia, anemia   Anesthesia Other Findings   Reproductive/Obstetrics                              Lab Results  Component Value Date   WBC 9.0 02/22/2023   HGB 12.1 (L) 02/22/2023   HCT 41.4 02/22/2023   MCV 80.1 02/22/2023   PLT 334 02/22/2023   Lab Results  Component Value Date   NA 135 02/22/2023   CL 99 02/22/2023   K 3.7 02/22/2023   CO2 28 02/22/2023   BUN 13 02/22/2023   CREATININE 0.90 02/22/2023   GFRNONAA >60 02/22/2023   CALCIUM 9.5 02/22/2023   ALBUMIN 3.9 02/22/2023   GLUCOSE 99 02/22/2023    Anesthesia Physical Anesthesia Plan  ASA: 3  Anesthesia Plan: Spinal   Post-op Pain Management: Regional block* and Tylenol PO (pre-op)*   Induction:   PONV Risk Score and Plan: 1 and Propofol infusion, Dexamethasone and Ondansetron  Airway Management Planned: Natural Airway and Simple Face Mask  Additional Equipment:   Intra-op Plan:   Post-operative Plan:   Informed Consent: I have reviewed the patients History and Physical, chart, labs and discussed the procedure including the risks, benefits and alternatives for the proposed anesthesia with the patient or authorized representative who has indicated  his/her understanding and acceptance.       Plan Discussed with: CRNA  Anesthesia Plan Comments:          Anesthesia Quick Evaluation

## 2023-03-07 NOTE — Op Note (Signed)
DATE OF SURGERY:  03/07/2023 TIME: 2:34 PM  PATIENT NAME:  Michael [name].   AGE: 67 y.o.    PRE-OPERATIVE DIAGNOSIS: End-stage left knee osteoarthritis  POST-OPERATIVE DIAGNOSIS:  Same  PROCEDURE: Left total Knee Arthroplasty  SURGEON:  Evyn Putzier A Chiana Wamser, MD   ASSISTANT: Kathie Dike, PA-C, present and scrubbed throughout the case, critical for assistance with exposure, retraction, instrumentation, and closure.   OPERATIVE IMPLANTS:  [employer] press-fit persona size 12 left CR standard femur, size G left tibial baseplate, 35 mm 3 peg press-fit patella, 12 mm MC poly Implant Name Type Inv. Item Serial No. Manufacturer Lot No. LRB No. Used Action  COMP TIB PS G 0D LT - [id number] Joint COMP TIB PS G 0D LT  [employer](ORTH,TRAU,BIO,SG) [id number] Left 1 Implanted  COMP FEM PS STD KNEE 12 LT - [id number] Joint COMP FEM PS STD KNEE 12 LT  [employer](ORTH,TRAU,BIO,SG) [id number] Left 1 Implanted  COMP PATELLA 3 PEG 35 - [id number] Joint COMP PATELLA 3 PEG 35  [employer](ORTH,TRAU,BIO,SG) [id number] Left 1 Implanted  LINER TIB ASF PS GH/12 LT - [id number] Liner LINER TIB ASF PS GH/12 LT  [employer](ORTH,TRAU,BIO,SG) [id number] Left 1 Implanted      PREOPERATIVE INDICATIONS:  Michael [name]. is a 67 y.o. year old male with end stage bone on bone degenerative arthritis of the knee who failed conservative treatment, including injections, antiinflammatories, activity modification, and assistive devices, and had significant impairment of their activities of daily living, and elected for Total Knee Arthroplasty.   The risks, benefits, and alternatives were discussed at length including but not limited to the risks of infection, bleeding, nerve injury, stiffness, blood clots, the need for revision surgery, cardiopulmonary complications, among others, and they were willing to proceed.  ESTIMATED BLOOD LOSS: 50cc  OPERATIVE DESCRIPTION:   Once adequate anesthesia was  induced, preoperative antibiotics, 3 gm of ancef,1 gm of Tranexamic Acid, and 8 mg of Decadron administered, the patient was positioned supine with a left thigh tourniquet placed.  The left lower extremity was prepped and draped in sterile fashion.  A time-  out was performed identifying the patient, planned procedure, and the appropriate extremity.     The leg was  exsanguinated, tourniquet elevated to 250 mmHg.  A midline incision was  made followed by median parapatellar arthrotomy. Anterior horn of the medial meniscus was released and resected. A medial release was performed, the infrapatellar fat pad was resected with care taken to protect the patellar tendon. The suprapatellar fat was removed to exposed the distal anterior femur. The anterior horn of the lateral meniscus and ACL were released.    Following initial  exposure, I first started with the femur  The femoral  canal was opened with a drill, canal was suctioned to try to prevent fat emboli.  An  intramedullary rod was passed set at 6 degrees valgus, 10 mm. The distal femur was resected.  Following this resection, the tibia was  subluxated anteriorly.  Using the extramedullary guide, 10 mm of bone was resected off   the proximal lateral tibia.  We confirmed the gap would be  stable medially and laterally with a size 10mm spacer block as well as confirmed that the tibial cut was perpendicular in the coronal plane, checking with an alignment rod.    Once this was done, the posterior femoral referencing femoral sizer was placed under to the posterior condyles with 3 degrees of external rotational which was parallel to the transepicondylar  axis and perpendicular to [employer]. The femur was sized to be a size 12 in the anterior-  posterior dimension. The  anterior, posterior, and  chamfer cuts were made without difficulty nor   notching making certain that I was along the anterior cortex to help  with flexion gap stability. Next a laminar  spreader was placed with the knee in flexion and the medial lateral menisci were resected.  5 cc of the Exparel mixture was injected in the medial side of the back of the knee and 3 cc in the lateral side.  1/2 inch curved osteotome was used to resect posterior osteophyte that was then removed with a pituitary rongeur.       At this point, the tibia was sized to be a size G.  The size G tray was  then pinned in position. Trial reduction was now carried with a 12 femur, G tibia, a 10 mm MC insert.  Difficult to get the 10 mm poly and because the knee was still too tight in both flexion extension.  Had a rectangular flexion extension space of albuterol while balance.  I did resect additional 4 mm off the proximal tibia.  The knee had full extension and was stable to varus valgus stress in extension.  The knee was slightly tight in flexion and the PCL was partially released.   Attention was next directed to the patella.  Precut  measurement was noted to be 30 mm.  I resected down to 18 mm and used a  35mm patellar button to restore patellar height as well as cover the cut surface.     The patella lug holes were drilled and a 35 mm patella poly trial was placed.    The knee was brought to full extension with good flexion stability with the patella tracking through the trochlea without application of pressure.    Next the femoral component was again assessed and determined to be seated and appropriately lateralized.  The femoral lug holes were drilled.  The femoral component was then removed. Tibial component was again assessed and felt to be seated and appropriately rotated with the medial third of the tubercle. The tibia was then drilled, and keel punched.     Final components were  opened and impacted into place.   The knee was irrigated with sterile Betadine diluted in saline as well as pulse lavage normal saline. The synovial lining was  then injected a dilute Exparel with 30cc of 0.25% marcaine with  epinephrine.     I confirmed that I was satisfied with the range of motion and stability, and the final 12 mm MC poly insert was chosen.  It was placed into the knee.         The tourniquet had been let down.  No significant hemostasis was required.  The medial parapatellar arthrotomy was then reapproximated using #1 Vicryl and #1 Stratafix sutures with the knee  in flexion.  The remaining wound was closed with 0 stratafix, 2-0 Vicryl, and running 3-0 Monocryl. The knee was cleaned, dried, dressed sterilely using Dermabond and   Aquacel dressing.  The patient was then brought to recovery room in stable condition, tolerating the procedure  well. There were no complications.   Post op recs: WB: WBAT Abx: ancef Imaging: PACU xrays DVT prophylaxis: Aspirin 81mg  BID x4 weeks Follow up: 2 weeks after surgery for a wound check with Dr. Blanchie Dessert at [facility].  Address: [address],  [place], [place] [address]  Office Phone: [phone]  Weber Cooks, MD Orthopaedic Surgery

## 2023-03-07 NOTE — Interval H&P Note (Signed)
The patient has been re-examined, and the chart reviewed, and there have been no interval changes to the documented history and physical.    Plan for L TKA for L knee OA  The operative side was examined and the patient was confirmed to have sensation to DPN, SPN, TN intact, Motor EHL, ext, flex 5/5, and DP 2+, PT 2+, No significant edema.   The risks, benefits, and alternatives have been discussed at length with patient, and the patient is willing to proceed.  Left knee marked. Consent has been signed.  

## 2023-03-07 NOTE — Anesthesia Postprocedure Evaluation (Signed)
Anesthesia Post Note  Patient: Michael [name].  Procedure(s) Performed: TOTAL KNEE ARTHROPLASTY (Left: Knee)     Patient location during evaluation: PACU Anesthesia Type: Spinal Level of consciousness: awake and alert Pain management: pain level controlled Vital Signs Assessment: post-procedure vital signs reviewed and stable Respiratory status: spontaneous breathing and respiratory function stable Cardiovascular status: blood pressure returned to baseline and stable Postop Assessment: spinal receding Anesthetic complications: no  No notable events documented.  Last Vitals:  Vitals:   03/07/23 1645 03/07/23 1700  BP:  (!) 141/75  Pulse: 68 70  Resp: (!) 24 12  Temp:    SpO2: 100% 100%    Last Pain:  Vitals:   03/07/23 1736  TempSrc:   PainSc: 8                  Kennieth Rad

## 2023-03-07 NOTE — Progress Notes (Signed)
Orthopedic Tech Progress Note Patient Details:  Christopherjose Janosik. [birth year] [id number]  Ortho Devices Type of Ortho Device: Bone foam zero knee Ortho Device/Splint Location: left Ortho Device/Splint Interventions: Ordered, Application, Adjustment   Post Interventions Patient Tolerated: Well Instructions Provided: Adjustment of device, Care of device  Kizzie Fantasia 03/07/2023, 4:21 PM

## 2023-03-07 NOTE — Anesthesia Procedure Notes (Signed)
Anesthesia Regional Block: Adductor canal block   Pre-Anesthetic Checklist: , timeout performed,  Correct Patient, Correct Site, Correct Laterality,  Correct Procedure, Correct Position, site marked,  Risks and benefits discussed,  Surgical consent,  Pre-op evaluation,  At surgeon's request and post-op pain management  Laterality: Left  Prep: chloraprep       Needles:  Injection technique: Single-shot  Needle Type: Echogenic Needle     Needle Length: 9cm  Needle Gauge: 21     Additional Needles:   Procedures:,,,, ultrasound used (permanent image in chart),,    Narrative:  Start time: 03/07/2023 11:00 AM End time: 03/07/2023 11:05 AM Injection made incrementally with aspirations every 5 mL.  Performed by: Personally  Anesthesiologist: Marcene Duos, MD

## 2023-03-07 NOTE — Anesthesia Procedure Notes (Addendum)
Spinal  Patient location during procedure: OR Start time: 03/07/2023 12:35 PM End time: 03/07/2023 12:40 PM Reason for block: surgical anesthesia Staffing Performed: anesthesiologist  Anesthesiologist: Marcene Duos, MD Performed by: Marcene Duos, MD Authorized by: Marcene Duos, MD   Preanesthetic Checklist Completed: patient identified, IV checked, site marked, risks and benefits discussed, surgical consent, monitors and equipment checked, pre-op evaluation and timeout performed Spinal Block Patient position: sitting Prep: DuraPrep Patient monitoring: heart rate, cardiac monitor, continuous pulse ox and blood pressure Approach: midline Location: L4-5 Injection technique: single-shot Needle Needle type: Pencan  Needle gauge: 24 G Needle length: 9 cm Assessment Sensory level: T4 Events: CSF return

## 2023-03-07 NOTE — Plan of Care (Signed)

## 2023-03-07 NOTE — Evaluation (Signed)
Physical Therapy Evaluation Patient Details Name: Michael [name]. MRN: [id number] DOB: 1955/04/09 Today's Date: 03/07/2023  History of Present Illness  67 yo male presents to therapy s/p L TKA on 03/07/2023 due to failure of conservative measures. Pt PMH includes but is not limited to: DM II, anemia, HLD, HTN, epilepsy, leagally blind and angina.  Clinical Impression   Michael [name]. is a 68 y.o. male POD 0 s/p L TKA. Patient reports IND with mobility at baseline. Patient is now limited by functional impairments (see PT problem list below) and requires CGA for bed mobility and min A for transfers. Patient was able to ambulate 40 feet with RW and CGA level of assist. Patient instructed in exercise to facilitate ROM and circulation to manage edema.  Patient will benefit from continued skilled PT interventions to address impairments and progress towards PLOF. Acute PT will follow to progress mobility and stair training in preparation for safe discharge home with caregiver and social support with [facility] services.       If plan is discharge home, recommend the following: A little help with walking and/or transfers;A little help with bathing/dressing/bathroom;Assistance with cooking/housework;Assist for transportation;Help with stairs or ramp for entrance   Can travel by private vehicle        Equipment Recommendations Rolling walker (2 wheels)  Recommendations for Other Services       Functional Status Assessment Patient has had a recent decline in their functional status and demonstrates the ability to make significant improvements in function in a reasonable and predictable amount of time.     Precautions / Restrictions Precautions Precautions: Knee;Fall (leagally blind) Restrictions Weight Bearing Restrictions: No      Mobility  Bed Mobility Overal bed mobility: Needs Assistance Bed Mobility: Supine to Sit     Supine to sit: Contact guard, HOB elevated     General bed  mobility comments: min cues    Transfers Overall transfer level: Needs assistance Equipment used: Rolling walker (2 wheels) Transfers: Sit to/from Stand Sit to Stand: Min assist           General transfer comment: min cues push to stand with R UE    Ambulation/Gait Ambulation/Gait assistance: Contact guard assist Gait Distance (Feet): 40 Feet Assistive device: Rolling walker (2 wheels) Gait Pattern/deviations: Step-to pattern, Antalgic Gait velocity: decreased     General Gait Details: min cues for safety, direction posture  Stairs            Wheelchair Mobility     Tilt Bed    Modified Rankin (Stroke Patients Only)       Balance Overall balance assessment: Needs assistance Sitting-balance support: Feet supported Sitting balance-Leahy Scale: Good     Standing balance support: Bilateral upper extremity supported, Reliant on assistive device for balance, During functional activity Standing balance-Leahy Scale: Poor                               Pertinent Vitals/Pain Pain Assessment Pain Assessment: 0-10 Pain Score: 7  Pain Location: L knee and ankle Pain Descriptors / Indicators: Aching, Constant, Cramping, Discomfort, Grimacing, Operative site guarding Pain Intervention(s): Limited activity within patient's tolerance, Monitored during session, Premedicated before session, Repositioned, Ice applied    Home Living Family/patient expects to be discharged to:: Private residence Living Arrangements: Alone Available Help at Discharge: Personal care attendant;Friend(s) (for first 3 days following d/c 24/7) Type of Home: House Home Access: Stairs to  enter Entrance Stairs-Rails: None Entrance Stairs-Number of Steps: 1 Alternate Level Stairs-Number of Steps: flight Home Layout: Two level;1/2 bath on main level;Bed/bath upstairs Home Equipment: None      Prior Function Prior Level of Function : Independent/Modified  Independent;Working/employed             Mobility Comments: IND no AD for all ADLs, self care tasks pt requires A for IADLs and due to visual impairments not driving       Extremity/Trunk Assessment        Lower Extremity Assessment Lower Extremity Assessment: LLE deficits/detail LLE Deficits / Details: ankle DF/PF 5/5; SLR < 10 degree lag LLE Sensation: WNL    Cervical / Trunk Assessment Cervical / Trunk Assessment: Normal  Communication   Communication Communication: No apparent difficulties  Cognition Arousal: Alert Behavior During Therapy: WFL for tasks assessed/performed Overall Cognitive Status: Within Functional Limits for tasks assessed                                          General Comments      Exercises Total Joint Exercises Ankle Circles/Pumps: AROM, Both, 10 reps   Assessment/Plan    PT Assessment Patient needs continued PT services  PT Problem List Decreased strength;Decreased range of motion;Decreased activity tolerance;Decreased balance;Decreased mobility;Decreased coordination;Pain       PT Treatment Interventions DME instruction;Gait training;Stair training;Functional mobility training;Therapeutic activities;Therapeutic exercise;Balance training;Neuromuscular re-education;Patient/family education;Modalities    PT Goals (Current goals can be found in the Care Plan section)  Acute Rehab PT Goals Patient Stated Goal: to get strong enough to have the R knee replaced PT Goal Formulation: With patient Time For Goal Achievement: 03/21/23 Potential to Achieve Goals: Good    Frequency 7X/week     Co-evaluation               AM-PAC PT "6 Clicks" Mobility  Outcome Measure Help needed turning from your back to your side while in a flat bed without using bedrails?: A Little Help needed moving from lying on your back to sitting on the side of a flat bed without using bedrails?: A Little Help needed moving to and from a bed  to a chair (including a wheelchair)?: A Little Help needed standing up from a chair using your arms (e.g., wheelchair or bedside chair)?: A Little Help needed to walk in hospital room?: A Little Help needed climbing 3-5 steps with a railing? : Total 6 Click Score: 16    End of Session Equipment Utilized During Treatment: Gait belt Activity Tolerance: Patient tolerated treatment well;No increased pain Patient left: in chair;with call bell/phone within reach Nurse Communication: Mobility status PT Visit Diagnosis: Unsteadiness on feet (R26.81);Other abnormalities of gait and mobility (R26.89);Muscle weakness (generalized) (M62.81);Difficulty in walking, not elsewhere classified (R26.2);Pain Pain - Right/Left: Left Pain - part of body: Knee;Leg;Ankle and joints of foot    Time: 1610-9604 PT Time Calculation (min) (ACUTE ONLY): 25 min   Charges:   PT Evaluation $PT Eval Low Complexity: 1 Low PT Treatments $Gait Training: 8-22 mins PT General Charges $$ ACUTE PT VISIT: 1 Visit         Michael [name], PT Acute Rehab   Michael [name] 03/07/2023, 7:22 PM

## 2023-03-07 NOTE — Discharge Instructions (Signed)

## 2023-03-07 NOTE — Plan of Care (Signed)
  Problem: Education: Goal: Knowledge of General Education information will improve Description: Including pain rating scale, medication(s)/side effects and non-pharmacologic comfort measures Outcome: Progressing   Problem: Activity: Goal: Risk for activity intolerance will decrease Outcome: Progressing   Problem: Pain Management: Goal: General experience of comfort will improve Outcome: Progressing

## 2023-03-08 ENCOUNTER — Encounter (HOSPITAL_COMMUNITY): Payer: Self-pay | Admitting: Orthopedic Surgery

## 2023-03-08 DIAGNOSIS — E119 Type 2 diabetes mellitus without complications: Secondary | ICD-10-CM | POA: Diagnosis not present

## 2023-03-08 DIAGNOSIS — I10 Essential (primary) hypertension: Secondary | ICD-10-CM | POA: Diagnosis not present

## 2023-03-08 DIAGNOSIS — Z7984 Long term (current) use of oral hypoglycemic drugs: Secondary | ICD-10-CM | POA: Diagnosis not present

## 2023-03-08 DIAGNOSIS — M1712 Unilateral primary osteoarthritis, left knee: Secondary | ICD-10-CM | POA: Diagnosis not present

## 2023-03-08 DIAGNOSIS — Z87891 Personal history of nicotine dependence: Secondary | ICD-10-CM | POA: Diagnosis not present

## 2023-03-08 DIAGNOSIS — Z79899 Other long term (current) drug therapy: Secondary | ICD-10-CM | POA: Diagnosis not present

## 2023-03-08 LAB — CBC
HCT: 35.6 % — ABNORMAL LOW (ref 39.0–52.0)
Hemoglobin: 10.6 g/dL — ABNORMAL LOW (ref 13.0–17.0)
MCH: 23.7 pg — ABNORMAL LOW (ref 26.0–34.0)
MCHC: 29.8 g/dL — ABNORMAL LOW (ref 30.0–36.0)
MCV: 79.6 fL — ABNORMAL LOW (ref 80.0–100.0)
Platelets: 219 10*3/uL (ref 150–400)
RBC: 4.47 MIL/uL (ref 4.22–5.81)
RDW: 15.1 % (ref 11.5–15.5)
WBC: 13.1 10*3/uL — ABNORMAL HIGH (ref 4.0–10.5)
nRBC: 0 % (ref 0.0–0.2)

## 2023-03-08 LAB — BASIC METABOLIC PANEL
Anion gap: 8 (ref 5–15)
BUN: 20 mg/dL (ref 8–23)
CO2: 23 mmol/L (ref 22–32)
Calcium: 8.8 mg/dL — ABNORMAL LOW (ref 8.9–10.3)
Chloride: 105 mmol/L (ref 98–111)
Creatinine, Ser: 0.86 mg/dL (ref 0.61–1.24)
GFR, Estimated: >60 mL/min (ref >60)
Glucose, Bld: 131 mg/dL — ABNORMAL HIGH (ref 70–99)
Potassium: 4.2 mmol/L (ref 3.5–5.1)
Sodium: 136 mmol/L (ref 135–145)

## 2023-03-08 LAB — GLUCOSE, CAPILLARY
Glucose-Capillary: 113 mg/dL — ABNORMAL HIGH (ref 70–99)
Glucose-Capillary: 125 mg/dL — ABNORMAL HIGH (ref 70–99)

## 2023-03-08 NOTE — Discharge Summary (Signed)
Physician Discharge Summary  Patient ID: Michael [name]. MRN: [id number] DOB/AGE: 66-Jun-1957 66 y.o.  Admit date: 03/07/2023 Discharge date: 03/08/2023  Admission Diagnoses:  Primary osteoarthritis of left knee  Discharge Diagnoses:  Principal Problem:   Primary osteoarthritis of left knee   Past Medical History:  Diagnosis Date   Anemia    Arthritis    Chest pain    Per incoming records from [facility]    Diabetes mellitus without complication (HCC)    Epilepsy (HCC)    Hypertension    Obesity, morbid, BMI 50 or higher (HCC)    Per incoming records from [facility]    Type 2 diabetes mellitus (HCC)     Surgeries: Procedure(s): TOTAL KNEE ARTHROPLASTY on 03/07/2023   Consultants (if any):   Discharged Condition: Improved  Hospital Course: Michael [name]. is an 67 y.o. male who was admitted 03/07/2023 with a diagnosis of Primary osteoarthritis of left knee and went to the operating room on 03/07/2023 and underwent the above named procedures.    He was given perioperative antibiotics:  Anti-infectives (From admission, onward)    Start     Dose/Rate Route Frequency Ordered Stop   03/07/23 1800  ceFAZolin (ANCEF) IVPB 2g/100 mL premix        2 g 200 mL/hr over 30 Minutes Intravenous Every 6 hours 03/07/23 1714 03/07/23 2350   03/07/23 0900  ceFAZolin (ANCEF) IVPB 3g/100 mL premix        3 g 200 mL/hr over 30 Minutes Intravenous On call to O.R. 03/07/23 4098 03/07/23 1307     .  He was given sequential compression devices, early ambulation, and aspirin for DVT prophylaxis.  He benefited maximally from the hospital stay and there were no complications.    Recent vital signs:  Vitals:   03/08/23 0124 03/08/23 0600  BP:  138/79  Pulse:  76  Resp:  18  Temp: 98.8 F (37.1 C) 97.9 F (36.6 C)  SpO2:  99%    Recent laboratory studies:  Lab Results  Component Value Date   HGB 10.6 (L) 03/08/2023   HGB 12.1 (L) 02/22/2023   HGB 12.4 (L) 11/24/2022    Lab Results  Component Value Date   WBC 13.1 (H) 03/08/2023   PLT 219 03/08/2023   No results found for: "INR" Lab Results  Component Value Date   NA 136 03/08/2023   K 4.2 03/08/2023   CL 105 03/08/2023   CO2 23 03/08/2023   BUN 20 03/08/2023   CREATININE 0.86 03/08/2023   GLUCOSE 131 (H) 03/08/2023    Discharge Medications:   Allergies as of 03/08/2023   No Known Allergies      Medication List     STOP taking these medications    naproxen sodium 220 MG tablet Commonly known as: ALEVE       TAKE these medications    acetaminophen 500 MG tablet Commonly known as: TYLENOL Take 2 tablets (1,000 mg total) by mouth every 8 (eight) hours as needed.   amLODipine 10 MG tablet Commonly known as: NORVASC Take 1 tablet by mouth once daily   aspirin EC 81 MG tablet Take 1 tablet (81 mg total) by mouth 2 (two) times daily for 28 days. Swallow whole.   atorvastatin 20 MG tablet Commonly known as: LIPITOR Take 1 tablet by mouth once daily   benzonatate 100 MG capsule Commonly known as: TESSALON Take 1 capsule (100 mg total) by mouth every 8 (eight) hours  as needed for cough.   celecoxib 100 MG capsule Commonly known as: CeleBREX Take 1 capsule (100 mg total) by mouth 2 (two) times daily for 14 days.   hydrochlorothiazide 25 MG tablet Commonly known as: HYDRODIURIL Take 1 tablet by mouth once daily   Jardiance 10 MG Tabs tablet Generic drug: empagliflozin TAKE 1 TABLET BY MOUTH ONCE DAILY BEFORE BREAKFAST   lisinopril 10 MG tablet Commonly known as: ZESTRIL Take 1 tablet by mouth once daily   metFORMIN 1000 MG tablet Commonly known as: GLUCOPHAGE TAKE 1 TABLET BY MOUTH TWICE DAILY WITH A MEAL   methocarbamol 500 MG tablet Commonly known as: ROBAXIN Take 1 tablet (500 mg total) by mouth every 8 (eight) hours as needed for up to 10 days for muscle spasms.   ondansetron 4 MG tablet Commonly known as: Zofran Take 1 tablet (4 mg total) by mouth every 8  (eight) hours as needed for up to 14 days for nausea or vomiting.   oxyCODONE 5 MG immediate release tablet Commonly known as: Roxicodone Take 1 tablet (5 mg total) by mouth every 4 (four) hours as needed for up to 7 days for severe pain (pain score 7-10) or moderate pain (pain score 4-6).        Diagnostic Studies: DG Knee Left Port  Result Date: 03/07/2023 CLINICAL DATA:  Postop state EXAM: PORTABLE LEFT KNEE - 2 VIEW COMPARISON:  None Available. FINDINGS: Total knee arthroplasty identified with Press-Fit components. Patellar button. Expected alignment. Knee joint effusion identified with the air. Some soft tissue gas also seen. Please correlate with the time course of surgery. Imaging was obtained to aid in treatment. IMPRESSION: Acute surgical changes of total knee arthroplasty. Electronically Signed   By: Karen Kays M.D.   On: 03/07/2023 19:50    Disposition: Discharge disposition: 01-Home or Self Care       Discharge Instructions     Call MD / Call 911   Complete by: As directed    If you experience chest pain or shortness of breath, CALL 911 and be transported to the hospital emergency room.  If you develope a fever above 101 F, pus (white drainage) or increased drainage or redness at the wound, or calf pain, call your surgeon's office.   Constipation Prevention   Complete by: As directed    Drink plenty of fluids.  Prune juice may be helpful.  You may use a stool softener, such as Colace (over the counter) 100 mg twice a day.  Use MiraLax (over the counter) for constipation as needed.   Diet - low sodium heart healthy   Complete by: As directed    Do not put a pillow under the knee. Place it under the heel.   Complete by: As directed    Driving restrictions   Complete by: As directed    No driving for 2-4 weeks   Increase activity slowly as tolerated   Complete by: As directed    Post-operative opioid taper instructions:   Complete by: As directed    POST-OPERATIVE  OPIOID TAPER INSTRUCTIONS: It is important to wean off of your opioid medication as soon as possible. If you do not need pain medication after your surgery it is ok to stop day one. Opioids include: Codeine, Hydrocodone(Norco, Vicodin), Oxycodone(Percocet, oxycontin) and hydromorphone amongst others.  Long term and even short term use of opiods can cause: Increased pain response Dependence Constipation Depression Respiratory depression And more.  Withdrawal symptoms can include Flu like symptoms  Nausea, vomiting And more Techniques to manage these symptoms Hydrate well Eat regular healthy meals Stay active Use relaxation techniques(deep breathing, meditating, yoga) Do Not substitute Alcohol to help with tapering If you have been on opioids for less than two weeks and do not have pain than it is ok to stop all together.  Plan to wean off of opioids This plan should start within one week post op of your joint replacement. Maintain the same interval or time between taking each dose and first decrease the dose.  Cut the total daily intake of opioids by one tablet each day Next start to increase the time between doses. The last dose that should be eliminated is the evening dose.      TED hose   Complete by: As directed    Use stockings (TED hose) for 2 weeks on both leg(s).  Then for 2 more weeks on the surgical leg.  You may remove them at night for sleeping.        Follow-up Information     Joen Laura, MD Follow up in 2 week(s).   Specialty: Orthopedic Surgery Contact information: [address] [place] [address] [phone]                    Discharge Instructions      INSTRUCTIONS AFTER JOINT REPLACEMENT   Remove items at home which could result in a fall. This includes throw rugs or furniture in walking pathways ICE to the affected joint every three hours while awake for 30 minutes at a time, for at least the first 3-5 days,  and then as needed for pain and swelling.  Continue to use ice for pain and swelling. You may notice swelling that will progress down to the foot and ankle.  This is normal after surgery.  Elevate your leg when you are not up walking on it.   Continue to use the breathing machine you got in the hospital (incentive spirometer) which will help keep your temperature down.  It is common for your temperature to cycle up and down following surgery, especially at night when you are not up moving around and exerting yourself.  The breathing machine keeps your lungs expanded and your temperature down.  DIET:  As you were doing prior to hospitalization, we recommend a well-balanced diet.  DRESSING / WOUND CARE / SHOWERING:  Keep the surgical dressing until follow up.  The dressing is water proof, so you can shower without any extra covering.  IF THE DRESSING FALLS OFF or the wound gets wet inside, change the dressing with sterile gauze.  Please use good hand washing techniques before changing the dressing.  Do not use any lotions or creams on the incision until instructed by your surgeon.    ACTIVITY  Increase activity slowly as tolerated, but follow the weight bearing instructions below.   No driving for 6 weeks or until further direction given by your physician.  You cannot drive while taking narcotics.  No lifting or carrying greater than 10 lbs. until further directed by your surgeon. Avoid periods of inactivity such as sitting longer than an hour when not asleep. This helps prevent blood clots.  You may return to work once you are authorized by your doctor.   WEIGHT BEARING: Weight bearing as tolerated with assist device (walker, cane, etc) as directed, use it as long as suggested by your surgeon or therapist, typically at least 4-6 weeks.  EXERCISES  Results after joint replacement surgery are often greatly improved when you follow the exercise, range of motion and muscle strengthening exercises  prescribed by your doctor. Safety measures are also important to protect the joint from further injury. Any time any of these exercises cause you to have increased pain or swelling, decrease what you are doing until you are comfortable again and then slowly increase them. If you have problems or questions, call your caregiver or physical therapist for advice.   Rehabilitation is important following a joint replacement. After just a few days of immobilization, the muscles of the leg can become weakened and shrink (atrophy).  These exercises are designed to build up the tone and strength of the thigh and leg muscles and to improve motion. Often times heat used for twenty to thirty minutes before working out will loosen up your tissues and help with improving the range of motion but do not use heat for the first two weeks following surgery (sometimes heat can increase post-operative swelling).   These exercises can be done on a training (exercise) mat, on the floor, on a table or on a bed. Use whatever works the best and is most comfortable for you.    Use music or television while you are exercising so that the exercises are a pleasant break in your day. This will make your life better with the exercises acting as a break in your routine that you can look forward to.   Perform all exercises about fifteen times, three times per day or as directed.  You should exercise both the operative leg and the other leg as well.  Exercises include:   Quad Sets - Tighten up the muscle on the front of the thigh (Quad) and hold for 5-10 seconds.   Straight Leg Raises - With your knee straight (if you were given a brace, keep it on), lift the leg to 60 degrees, hold for 3 seconds, and slowly lower the leg.  Perform this exercise against resistance later as your leg gets stronger.  Leg Slides: Lying on your back, slowly slide your foot toward your buttocks, bending your knee up off the floor (only go as far as is  comfortable). Then slowly slide your foot back down until your leg is flat on the floor again.  Angel Wings: Lying on your back spread your legs to the side as far apart as you can without causing discomfort.  Hamstring Strength:  Lying on your back, push your heel against the floor with your leg straight by tightening up the muscles of your buttocks.  Repeat, but this time bend your knee to a comfortable angle, and push your heel against the floor.  You may put a pillow under the heel to make it more comfortable if necessary.   A rehabilitation program following joint replacement surgery can speed recovery and prevent re-injury in the future due to weakened muscles. Contact your doctor or a physical therapist for more information on knee rehabilitation.   CONSTIPATION:  Constipation is defined medically as fewer than three stools per week and severe constipation as less than one stool per week.  Even if you have a regular bowel pattern at home, your normal regimen is likely to be disrupted due to multiple reasons following surgery.  Combination of anesthesia, postoperative narcotics, change in appetite and fluid intake all can affect your bowels.   YOU MUST use at least one of the following options; they are listed in order of increasing strength to  get the job done.  They are all available over the counter, and you may need to use some, POSSIBLY even all of these options:    Drink plenty of fluids (prune juice may be helpful) and high fiber foods Colace 100 mg by mouth twice a day  Senokot for constipation as directed and as needed Dulcolax (bisacodyl), take with full glass of water  Miralax (polyethylene glycol) once or twice a day as needed.  If you have tried all these things and are unable to have a bowel movement in the first 3-4 days after surgery call either your surgeon or your primary doctor.    If you experience loose stools or diarrhea, hold the medications until you stool forms back  up.  If your symptoms do not get better within 1 week or if they get worse, check with your doctor.  If you experience "the worst abdominal pain ever" or develop nausea or vomiting, please contact the office immediately for further recommendations for treatment.  ITCHING:  If you experience itching with your medications, try taking only a single pain pill, or even half a pain pill at a time.  You can also use Benadryl over the counter for itching or also to help with sleep.   TED HOSE STOCKINGS:  Use stockings on both legs until for at least 2 weeks or as directed by physician office. They may be removed at night for sleeping.  MEDICATIONS:  See your medication summary on the "After Visit Summary" that nursing will review with you.  You may have some home medications which will be placed on hold until you complete the course of blood thinner medication.  It is important for you to complete the blood thinner medication as prescribed.  Blood clot prevention (DVT Prophylaxis): After surgery you are at an increased risk for a blood clot. you were prescribed a blood thinner, Aspirin 81mg , to be taken twice daily for a total of 4 weeks from surgery to help reduce your risk of getting a blood clot.  Signs of a pulmonary embolus (blood clot in the lungs) include sudden short of breath, feeling lightheaded or dizzy, chest pain with a deep breath, rapid pulse rapid breathing.  Signs of a blood clot in your arms or legs include new unexplained swelling and cramping, warm, red or darkened skin around the painful area.  Please call the office or 911 right away if these signs or symptoms develop.  PRECAUTIONS:   If you experience chest pain or shortness of breath - call 911 immediately for transfer to the hospital emergency department.   If you develop a fever greater that 101 F, purulent drainage from wound, increased redness or drainage from wound, foul odor from the wound/dressing, or calf pain - CONTACT YOUR  SURGEON.                                                   FOLLOW-UP APPOINTMENTS:  If you do not already have a post-op appointment, please call the office for an appointment to be seen by your surgeon.  Guidelines for how soon to be seen are listed in your "After Visit Summary", but are typically between 2-3 weeks after surgery.  If you have a specialized bandage, you may be told to follow up 1 week after surgery.  OTHER INSTRUCTIONS:  Knee Replacement:  Do not place pillow under knee, focus on keeping the knee straight while resting.  Place foam block, curve side up under heel at all times except when walking.  DO NOT modify, tear, cut, or change the foam block in any way.  POST-OPERATIVE OPIOID TAPER INSTRUCTIONS: It is important to wean off of your opioid medication as soon as possible. If you do not need pain medication after your surgery it is ok to stop day one. Opioids include: Codeine, Hydrocodone(Norco, Vicodin), Oxycodone(Percocet, oxycontin) and hydromorphone amongst others.  Long term and even short term use of opiods can cause: Increased pain response Dependence Constipation Depression Respiratory depression And more.  Withdrawal symptoms can include Flu like symptoms Nausea, vomiting And more Techniques to manage these symptoms Hydrate well Eat regular healthy meals Stay active Use relaxation techniques(deep breathing, meditating, yoga) Do Not substitute Alcohol to help with tapering If you have been on opioids for less than two weeks and do not have pain than it is ok to stop all together.  Plan to wean off of opioids This plan should start within one week post op of your joint replacement. Maintain the same interval or time between taking each dose and first decrease the dose.  Cut the total daily intake of opioids by one tablet each day Next start to increase the time between doses. The last dose that should be eliminated is the evening dose.   MAKE SURE YOU:   Understand these instructions.  Get help right away if you are not doing well or get worse.    Thank you for letting us be a part of your medical care team.  It is a privilege we respect greatly.  We hope these instructions will help you stay on track for a fast and full recovery!            Signed: Tiler Brandis A Ameliah Baskins 03/08/2023, 7:44 AM

## 2023-03-08 NOTE — Progress Notes (Signed)
     Subjective: Patient reports pain as mild.  Did well with PT today.  Denies distal n/t. Has good support for discharge home.  Objective:   VITALS:   Vitals:   03/07/23 2008 03/08/23 0123 03/08/23 0124 03/08/23 0600  BP: (!) 144/72 126/74  138/79  Pulse: 89 84  76  Resp: 18 16  18   Temp: 97.7 F (36.5 C)  98.8 F (37.1 C) 97.9 F (36.6 C)  TempSrc: Oral  Oral Oral  SpO2: 100% 98%  99%  Weight:      Height:        Sensation intact distally Intact pulses distally Dorsiflexion/Plantar flexion intact Incision: dressing C/D/I Compartment soft    Lab Results  Component Value Date   WBC 13.1 (H) 03/08/2023   HGB 10.6 (L) 03/08/2023   HCT 35.6 (L) 03/08/2023   MCV 79.6 (L) 03/08/2023   PLT 219 03/08/2023   BMET    Component Value Date/Time   NA 136 03/08/2023 0333   NA 139 11/12/2015 0000   K 4.2 03/08/2023 0333   CL 105 03/08/2023 0333   CO2 23 03/08/2023 0333   GLUCOSE 131 (H) 03/08/2023 0333   BUN 20 03/08/2023 0333   BUN 12 11/12/2015 0000   CREATININE 0.86 03/08/2023 0333   CREATININE 0.93 10/04/2022 0813   CALCIUM 8.8 (L) 03/08/2023 0333   EGFR 91 10/04/2022 0813   GFRNONAA >60 03/08/2023 0333   GFRNONAA 87 09/10/2020 0819      Xray: TKA components in good position no adverse features  Assessment/Plan: 1 Day Post-Op   Principal Problem:   Primary osteoarthritis of left knee  S/p L TKA 03/07/23  Post op recs: WB: WBAT Abx: ancef Imaging: PACU xrays DVT prophylaxis: Aspirin 81mg  BID x4 weeks Follow up: 2 weeks after surgery for a wound check with Dr. Blanchie Dessert at [facility].  Address: [address], [place], [place] [address]  Office Phone: [phone]   Joen Laura 03/08/2023, 7:41 AM   Weber Cooks, MD  Contact information:   [phone] 7am-5pm epic message Dr. Blanchie Dessert, or call office for patient follow up: [phone] After hours and holidays please check [email or web address] for group call  information for [facility]

## 2023-03-08 NOTE — Progress Notes (Signed)
Physical Therapy Treatment Patient Details Name: Michael [name]. MRN: [id number] DOB: Mar 16, 1956 Today's Date: 03/08/2023   History of Present Illness 67 yo male presents to therapy s/p L TKA on 03/07/2023 due to failure of conservative measures. Pt PMH includes but is not limited to: DM II, anemia, HLD, HTN, epilepsy, leagally blind and angina.    PT Comments  Pt ambulated 220' with RW, no loss of balance, no buckling. Stair training completed. Pt demonstrates good understanding of HEP. He is ready to DC home from a PT standpoint.      If plan is discharge home, recommend the following: A little help with walking and/or transfers;A little help with bathing/dressing/bathroom;Assistance with cooking/housework;Assist for transportation;Help with stairs or ramp for entrance   Can travel by private vehicle        Equipment Recommendations  Rolling walker (2 wheels)    Recommendations for Other Services       Precautions / Restrictions Precautions Precautions: Knee;Fall Precaution Booklet Issued: Yes (comment) Precaution Comments: legally blind Restrictions Weight Bearing Restrictions: No     Mobility  Bed Mobility Overal bed mobility: Modified Independent Bed Mobility: Supine to Sit     Supine to sit: HOB elevated, Modified independent (Device/Increase time), Used rails          Transfers Overall transfer level: Needs assistance Equipment used: Rolling walker (2 wheels) Transfers: Sit to/from Stand Sit to Stand: Supervision           General transfer comment: VCs hand placement    Ambulation/Gait Ambulation/Gait assistance: Modified independent (Device/Increase time) Gait Distance (Feet): 220 Feet Assistive device: Rolling walker (2 wheels) Gait Pattern/deviations: Step-to pattern, Antalgic Gait velocity: decreased     General Gait Details: steady, no loss of balance, no buckling   Stairs Stairs: Yes Stairs assistance: Supervision Stair  Management: Two rails Number of Stairs: 5 General stair comments: pt stated he has bilateral poles to hold onto, VCs sequencing   Wheelchair Mobility     Tilt Bed    Modified Rankin (Stroke Patients Only)       Balance Overall balance assessment: Needs assistance Sitting-balance support: Feet supported Sitting balance-Leahy Scale: Good     Standing balance support: Bilateral upper extremity supported, Reliant on assistive device for balance, During functional activity Standing balance-Leahy Scale: Poor                              Cognition Arousal: Alert Behavior During Therapy: WFL for tasks assessed/performed Overall Cognitive Status: Within Functional Limits for tasks assessed                                          Exercises Total Joint Exercises Ankle Circles/Pumps: AROM, Both, 10 reps Quad Sets: AROM, Both, 5 reps, Supine Short Arc Quad: AROM, Left, 5 reps, Supine Heel Slides: AAROM, Left, 5 reps, Supine Hip ABduction/ADduction: AAROM, Left, 5 reps, Supine Straight Leg Raises: AAROM, Left, 5 reps, Supine Long Arc Quad: AROM, Left, 5 reps, Seated Knee Flexion: AAROM, Left, 10 reps, Seated Goniometric ROM: 5-60* AAROM L knee    General Comments        Pertinent Vitals/Pain Pain Assessment Pain Score: 4  Pain Location: L knee Pain Descriptors / Indicators: Sore Pain Intervention(s): Limited activity within patient's tolerance, Monitored during session, Premedicated before session, Ice applied  Home Living                          Prior Function            PT Goals (current goals can now be found in the care plan section) Acute Rehab PT Goals Patient Stated Goal: to get strong enough to have the R knee replaced; bowling PT Goal Formulation: With patient Time For Goal Achievement: 03/21/23 Potential to Achieve Goals: Good Progress towards PT goals: Goals met/education completed, patient discharged from  PT    Frequency    7X/week      PT Plan      Co-evaluation              AM-PAC PT "6 Clicks" Mobility   Outcome Measure  Help needed turning from your back to your side while in a flat bed without using bedrails?: None Help needed moving from lying on your back to sitting on the side of a flat bed without using bedrails?: A Little Help needed moving to and from a bed to a chair (including a wheelchair)?: None Help needed standing up from a chair using your arms (e.g., wheelchair or bedside chair)?: None Help needed to walk in hospital room?: None Help needed climbing 3-5 steps with a railing? : A Little 6 Click Score: 22    End of Session Equipment Utilized During Treatment: Gait belt Activity Tolerance: Patient tolerated treatment well;No increased pain Patient left: in chair;with call bell/phone within reach;with chair alarm set Nurse Communication: Mobility status PT Visit Diagnosis: Unsteadiness on feet (R26.81);Other abnormalities of gait and mobility (R26.89);Muscle weakness (generalized) (M62.81);Difficulty in walking, not elsewhere classified (R26.2);Pain Pain - Right/Left: Left Pain - part of body: Knee;Leg;Ankle and joints of foot     Time: 1191-4782 PT Time Calculation (min) (ACUTE ONLY): 34 min  Charges:    $Gait Training: 8-22 mins $Therapeutic Exercise: 8-22 mins PT General Charges $$ ACUTE PT VISIT: 1 Visit                     Tamala Ser PT 03/08/2023  Acute Rehabilitation Services  Office [phone]

## 2023-03-08 NOTE — TOC Transition Note (Signed)
Transition of Care [facility]) - CM/SW Discharge Note   Patient Details  Name: Michael [name]. MRN: [id number] Date of Birth: November 05, 1955  Transition of Care [facility]) CM/SW Contact:  Amada Jupiter, LCSW Phone Number: 03/08/2023, 9:39 AM   Clinical Narrative:     Met with pt who confirms need for a RW and no DME agency preference - order placed with [employer] for delivery to room prior to dc.  Pt aware HHPT prearranged with [facility] by ortho MD office prior to surgery.  No further TOC needs.  Final next level of care: Home w Home Health Services Barriers to Discharge: No Barriers Identified   Patient Goals and CMS Choice      Discharge Placement                         Discharge Plan and Services Additional resources added to the After Visit Summary for                  DME Arranged: Walker rolling DME Agency: [employer] Date DME Agency Contacted: 03/08/23 Time DME Agency Contacted: [phone] Representative spoke with at DME Agency: Loraine Leriche HH Arranged: PT HH Agency: [facility]        Social Determinants of Health (SDOH) Interventions SDOH Screenings   Food Insecurity: No Food Insecurity (03/07/2023)  Housing: Patient Declined (03/07/2023)  Transportation Needs: No Transportation Needs (03/07/2023)  Utilities: Not At Risk (03/07/2023)  Depression (PHQ2-9): Low Risk  (10/04/2022)  Tobacco Use: Medium Risk (03/07/2023)     Readmission Risk Interventions     No data to display

## 2023-03-08 NOTE — Plan of Care (Signed)
  Problem: Education: Goal: Knowledge of General Education information will improve Description: Including pain rating scale, medication(s)/side effects and non-pharmacologic comfort measures Outcome: Progressing   Problem: Health Behavior/Discharge Planning: Goal: Ability to manage health-related needs will improve Outcome: Progressing   Problem: Clinical Measurements: Goal: Ability to maintain clinical measurements within normal limits will improve Outcome: Progressing Goal: Will remain free from infection Outcome: Progressing Goal: Diagnostic test results will improve Outcome: Progressing Goal: Respiratory complications will improve Outcome: Progressing Goal: Cardiovascular complication will be avoided Outcome: Progressing   Problem: Activity: Goal: Risk for activity intolerance will decrease Outcome: Progressing   Problem: Nutrition: Goal: Adequate nutrition will be maintained Outcome: Progressing   Problem: Coping: Goal: Level of anxiety will decrease Outcome: Progressing   Problem: Elimination: Goal: Will not experience complications related to bowel motility Outcome: Progressing Goal: Will not experience complications related to urinary retention Outcome: Progressing   Problem: Pain Management: Goal: General experience of comfort will improve Outcome: Progressing   Problem: Safety: Goal: Ability to remain free from injury will improve Outcome: Progressing   Problem: Skin Integrity: Goal: Risk for impaired skin integrity will decrease Outcome: Progressing   Problem: Education: Goal: Ability to describe self-care measures that may prevent or decrease complications (Diabetes Survival Skills Education) will improve Outcome: Progressing Goal: Individualized Educational Video(s) Outcome: Progressing   Problem: Coping: Goal: Ability to adjust to condition or change in health will improve Outcome: Progressing   Problem: Fluid Volume: Goal: Ability to  maintain a balanced intake and output will improve Outcome: Progressing   Problem: Health Behavior/Discharge Planning: Goal: Ability to identify and utilize available resources and services will improve Outcome: Progressing Goal: Ability to manage health-related needs will improve Outcome: Progressing   Problem: Metabolic: Goal: Ability to maintain appropriate glucose levels will improve Outcome: Progressing   Problem: Nutritional: Goal: Maintenance of adequate nutrition will improve Outcome: Progressing Goal: Progress toward achieving an optimal weight will improve Outcome: Progressing   Problem: Skin Integrity: Goal: Risk for impaired skin integrity will decrease Outcome: Progressing   Problem: Tissue Perfusion: Goal: Adequacy of tissue perfusion will improve Outcome: Progressing   Problem: Education: Goal: Knowledge of the prescribed therapeutic regimen will improve Outcome: Progressing Goal: Individualized Educational Video(s) Outcome: Progressing   Problem: Activity: Goal: Ability to avoid complications of mobility impairment will improve Outcome: Progressing Goal: Range of joint motion will improve Outcome: Progressing   Problem: Clinical Measurements: Goal: Postoperative complications will be avoided or minimized Outcome: Progressing   Problem: Pain Management: Goal: Pain level will decrease with appropriate interventions Outcome: Progressing   Problem: Skin Integrity: Goal: Will show signs of wound healing Outcome: Progressing

## 2023-03-09 DIAGNOSIS — H548 Legal blindness, as defined in USA: Secondary | ICD-10-CM | POA: Diagnosis not present

## 2023-03-09 DIAGNOSIS — E785 Hyperlipidemia, unspecified: Secondary | ICD-10-CM | POA: Diagnosis not present

## 2023-03-09 DIAGNOSIS — Z7982 Long term (current) use of aspirin: Secondary | ICD-10-CM | POA: Diagnosis not present

## 2023-03-09 DIAGNOSIS — K5900 Constipation, unspecified: Secondary | ICD-10-CM | POA: Diagnosis not present

## 2023-03-09 DIAGNOSIS — E1143 Type 2 diabetes mellitus with diabetic autonomic (poly)neuropathy: Secondary | ICD-10-CM | POA: Diagnosis not present

## 2023-03-09 DIAGNOSIS — Z471 Aftercare following joint replacement surgery: Secondary | ICD-10-CM | POA: Diagnosis not present

## 2023-03-09 DIAGNOSIS — Z96652 Presence of left artificial knee joint: Secondary | ICD-10-CM | POA: Diagnosis not present

## 2023-03-09 DIAGNOSIS — G40909 Epilepsy, unspecified, not intractable, without status epilepticus: Secondary | ICD-10-CM | POA: Diagnosis not present

## 2023-03-09 DIAGNOSIS — I10 Essential (primary) hypertension: Secondary | ICD-10-CM | POA: Diagnosis not present

## 2023-03-09 DIAGNOSIS — Z6837 Body mass index (BMI) 37.0-37.9, adult: Secondary | ICD-10-CM | POA: Diagnosis not present

## 2023-03-09 DIAGNOSIS — D649 Anemia, unspecified: Secondary | ICD-10-CM | POA: Diagnosis not present

## 2023-03-11 DIAGNOSIS — K5900 Constipation, unspecified: Secondary | ICD-10-CM | POA: Diagnosis not present

## 2023-03-11 DIAGNOSIS — Z7982 Long term (current) use of aspirin: Secondary | ICD-10-CM | POA: Diagnosis not present

## 2023-03-11 DIAGNOSIS — Z96652 Presence of left artificial knee joint: Secondary | ICD-10-CM | POA: Diagnosis not present

## 2023-03-11 DIAGNOSIS — D649 Anemia, unspecified: Secondary | ICD-10-CM | POA: Diagnosis not present

## 2023-03-11 DIAGNOSIS — H548 Legal blindness, as defined in USA: Secondary | ICD-10-CM | POA: Diagnosis not present

## 2023-03-11 DIAGNOSIS — E1143 Type 2 diabetes mellitus with diabetic autonomic (poly)neuropathy: Secondary | ICD-10-CM | POA: Diagnosis not present

## 2023-03-11 DIAGNOSIS — G40909 Epilepsy, unspecified, not intractable, without status epilepticus: Secondary | ICD-10-CM | POA: Diagnosis not present

## 2023-03-11 DIAGNOSIS — I10 Essential (primary) hypertension: Secondary | ICD-10-CM | POA: Diagnosis not present

## 2023-03-11 DIAGNOSIS — Z471 Aftercare following joint replacement surgery: Secondary | ICD-10-CM | POA: Diagnosis not present

## 2023-03-11 DIAGNOSIS — E785 Hyperlipidemia, unspecified: Secondary | ICD-10-CM | POA: Diagnosis not present

## 2023-03-11 DIAGNOSIS — Z6837 Body mass index (BMI) 37.0-37.9, adult: Secondary | ICD-10-CM | POA: Diagnosis not present

## 2023-03-13 ENCOUNTER — Other Ambulatory Visit: Payer: Self-pay | Admitting: Nurse Practitioner

## 2023-03-13 DIAGNOSIS — D649 Anemia, unspecified: Secondary | ICD-10-CM | POA: Diagnosis not present

## 2023-03-13 DIAGNOSIS — Z471 Aftercare following joint replacement surgery: Secondary | ICD-10-CM | POA: Diagnosis not present

## 2023-03-13 DIAGNOSIS — E1143 Type 2 diabetes mellitus with diabetic autonomic (poly)neuropathy: Secondary | ICD-10-CM | POA: Diagnosis not present

## 2023-03-13 DIAGNOSIS — Z6837 Body mass index (BMI) 37.0-37.9, adult: Secondary | ICD-10-CM | POA: Diagnosis not present

## 2023-03-13 DIAGNOSIS — K5900 Constipation, unspecified: Secondary | ICD-10-CM | POA: Diagnosis not present

## 2023-03-13 DIAGNOSIS — E785 Hyperlipidemia, unspecified: Secondary | ICD-10-CM | POA: Diagnosis not present

## 2023-03-13 DIAGNOSIS — Z96652 Presence of left artificial knee joint: Secondary | ICD-10-CM | POA: Diagnosis not present

## 2023-03-13 DIAGNOSIS — E0843 Diabetes mellitus due to underlying condition with diabetic autonomic (poly)neuropathy: Secondary | ICD-10-CM

## 2023-03-13 DIAGNOSIS — G40909 Epilepsy, unspecified, not intractable, without status epilepticus: Secondary | ICD-10-CM | POA: Diagnosis not present

## 2023-03-13 DIAGNOSIS — Z7982 Long term (current) use of aspirin: Secondary | ICD-10-CM | POA: Diagnosis not present

## 2023-03-13 DIAGNOSIS — H548 Legal blindness, as defined in USA: Secondary | ICD-10-CM | POA: Diagnosis not present

## 2023-03-13 DIAGNOSIS — I10 Essential (primary) hypertension: Secondary | ICD-10-CM | POA: Diagnosis not present

## 2023-03-15 DIAGNOSIS — E1143 Type 2 diabetes mellitus with diabetic autonomic (poly)neuropathy: Secondary | ICD-10-CM | POA: Diagnosis not present

## 2023-03-15 DIAGNOSIS — Z471 Aftercare following joint replacement surgery: Secondary | ICD-10-CM | POA: Diagnosis not present

## 2023-03-15 DIAGNOSIS — Z6837 Body mass index (BMI) 37.0-37.9, adult: Secondary | ICD-10-CM | POA: Diagnosis not present

## 2023-03-15 DIAGNOSIS — Z7982 Long term (current) use of aspirin: Secondary | ICD-10-CM | POA: Diagnosis not present

## 2023-03-15 DIAGNOSIS — Z96652 Presence of left artificial knee joint: Secondary | ICD-10-CM | POA: Diagnosis not present

## 2023-03-15 DIAGNOSIS — E785 Hyperlipidemia, unspecified: Secondary | ICD-10-CM | POA: Diagnosis not present

## 2023-03-15 DIAGNOSIS — H548 Legal blindness, as defined in USA: Secondary | ICD-10-CM | POA: Diagnosis not present

## 2023-03-15 DIAGNOSIS — D649 Anemia, unspecified: Secondary | ICD-10-CM | POA: Diagnosis not present

## 2023-03-15 DIAGNOSIS — K5900 Constipation, unspecified: Secondary | ICD-10-CM | POA: Diagnosis not present

## 2023-03-15 DIAGNOSIS — G40909 Epilepsy, unspecified, not intractable, without status epilepticus: Secondary | ICD-10-CM | POA: Diagnosis not present

## 2023-03-15 DIAGNOSIS — I10 Essential (primary) hypertension: Secondary | ICD-10-CM | POA: Diagnosis not present

## 2023-03-16 DIAGNOSIS — G40909 Epilepsy, unspecified, not intractable, without status epilepticus: Secondary | ICD-10-CM | POA: Diagnosis not present

## 2023-03-16 DIAGNOSIS — E1143 Type 2 diabetes mellitus with diabetic autonomic (poly)neuropathy: Secondary | ICD-10-CM | POA: Diagnosis not present

## 2023-03-16 DIAGNOSIS — Z471 Aftercare following joint replacement surgery: Secondary | ICD-10-CM | POA: Diagnosis not present

## 2023-03-16 DIAGNOSIS — I10 Essential (primary) hypertension: Secondary | ICD-10-CM | POA: Diagnosis not present

## 2023-03-16 DIAGNOSIS — K5900 Constipation, unspecified: Secondary | ICD-10-CM | POA: Diagnosis not present

## 2023-03-16 DIAGNOSIS — H548 Legal blindness, as defined in USA: Secondary | ICD-10-CM | POA: Diagnosis not present

## 2023-03-16 DIAGNOSIS — Z96652 Presence of left artificial knee joint: Secondary | ICD-10-CM | POA: Diagnosis not present

## 2023-03-16 DIAGNOSIS — Z7982 Long term (current) use of aspirin: Secondary | ICD-10-CM | POA: Diagnosis not present

## 2023-03-16 DIAGNOSIS — Z6837 Body mass index (BMI) 37.0-37.9, adult: Secondary | ICD-10-CM | POA: Diagnosis not present

## 2023-03-16 DIAGNOSIS — E785 Hyperlipidemia, unspecified: Secondary | ICD-10-CM | POA: Diagnosis not present

## 2023-03-16 DIAGNOSIS — D649 Anemia, unspecified: Secondary | ICD-10-CM | POA: Diagnosis not present

## 2023-03-19 DIAGNOSIS — K5900 Constipation, unspecified: Secondary | ICD-10-CM | POA: Diagnosis not present

## 2023-03-19 DIAGNOSIS — G40909 Epilepsy, unspecified, not intractable, without status epilepticus: Secondary | ICD-10-CM | POA: Diagnosis not present

## 2023-03-19 DIAGNOSIS — I10 Essential (primary) hypertension: Secondary | ICD-10-CM | POA: Diagnosis not present

## 2023-03-19 DIAGNOSIS — Z7982 Long term (current) use of aspirin: Secondary | ICD-10-CM | POA: Diagnosis not present

## 2023-03-19 DIAGNOSIS — H548 Legal blindness, as defined in USA: Secondary | ICD-10-CM | POA: Diagnosis not present

## 2023-03-19 DIAGNOSIS — D649 Anemia, unspecified: Secondary | ICD-10-CM | POA: Diagnosis not present

## 2023-03-19 DIAGNOSIS — E785 Hyperlipidemia, unspecified: Secondary | ICD-10-CM | POA: Diagnosis not present

## 2023-03-19 DIAGNOSIS — E1143 Type 2 diabetes mellitus with diabetic autonomic (poly)neuropathy: Secondary | ICD-10-CM | POA: Diagnosis not present

## 2023-03-19 DIAGNOSIS — Z471 Aftercare following joint replacement surgery: Secondary | ICD-10-CM | POA: Diagnosis not present

## 2023-03-19 DIAGNOSIS — Z6837 Body mass index (BMI) 37.0-37.9, adult: Secondary | ICD-10-CM | POA: Diagnosis not present

## 2023-03-19 DIAGNOSIS — Z96652 Presence of left artificial knee joint: Secondary | ICD-10-CM | POA: Diagnosis not present

## 2023-03-21 DIAGNOSIS — G40909 Epilepsy, unspecified, not intractable, without status epilepticus: Secondary | ICD-10-CM | POA: Diagnosis not present

## 2023-03-21 DIAGNOSIS — D649 Anemia, unspecified: Secondary | ICD-10-CM | POA: Diagnosis not present

## 2023-03-21 DIAGNOSIS — E785 Hyperlipidemia, unspecified: Secondary | ICD-10-CM | POA: Diagnosis not present

## 2023-03-21 DIAGNOSIS — Z6837 Body mass index (BMI) 37.0-37.9, adult: Secondary | ICD-10-CM | POA: Diagnosis not present

## 2023-03-21 DIAGNOSIS — Z471 Aftercare following joint replacement surgery: Secondary | ICD-10-CM | POA: Diagnosis not present

## 2023-03-21 DIAGNOSIS — I10 Essential (primary) hypertension: Secondary | ICD-10-CM | POA: Diagnosis not present

## 2023-03-21 DIAGNOSIS — H548 Legal blindness, as defined in USA: Secondary | ICD-10-CM | POA: Diagnosis not present

## 2023-03-21 DIAGNOSIS — Z96652 Presence of left artificial knee joint: Secondary | ICD-10-CM | POA: Diagnosis not present

## 2023-03-21 DIAGNOSIS — Z7982 Long term (current) use of aspirin: Secondary | ICD-10-CM | POA: Diagnosis not present

## 2023-03-21 DIAGNOSIS — K5900 Constipation, unspecified: Secondary | ICD-10-CM | POA: Diagnosis not present

## 2023-03-21 DIAGNOSIS — E1143 Type 2 diabetes mellitus with diabetic autonomic (poly)neuropathy: Secondary | ICD-10-CM | POA: Diagnosis not present

## 2023-03-22 DIAGNOSIS — M1712 Unilateral primary osteoarthritis, left knee: Secondary | ICD-10-CM | POA: Diagnosis not present

## 2023-03-23 DIAGNOSIS — H548 Legal blindness, as defined in USA: Secondary | ICD-10-CM | POA: Diagnosis not present

## 2023-03-23 DIAGNOSIS — D649 Anemia, unspecified: Secondary | ICD-10-CM | POA: Diagnosis not present

## 2023-03-23 DIAGNOSIS — K5900 Constipation, unspecified: Secondary | ICD-10-CM | POA: Diagnosis not present

## 2023-03-23 DIAGNOSIS — Z6837 Body mass index (BMI) 37.0-37.9, adult: Secondary | ICD-10-CM | POA: Diagnosis not present

## 2023-03-23 DIAGNOSIS — E1143 Type 2 diabetes mellitus with diabetic autonomic (poly)neuropathy: Secondary | ICD-10-CM | POA: Diagnosis not present

## 2023-03-23 DIAGNOSIS — E785 Hyperlipidemia, unspecified: Secondary | ICD-10-CM | POA: Diagnosis not present

## 2023-03-23 DIAGNOSIS — G40909 Epilepsy, unspecified, not intractable, without status epilepticus: Secondary | ICD-10-CM | POA: Diagnosis not present

## 2023-03-23 DIAGNOSIS — Z96652 Presence of left artificial knee joint: Secondary | ICD-10-CM | POA: Diagnosis not present

## 2023-03-23 DIAGNOSIS — Z471 Aftercare following joint replacement surgery: Secondary | ICD-10-CM | POA: Diagnosis not present

## 2023-03-23 DIAGNOSIS — I10 Essential (primary) hypertension: Secondary | ICD-10-CM | POA: Diagnosis not present

## 2023-03-23 DIAGNOSIS — Z7982 Long term (current) use of aspirin: Secondary | ICD-10-CM | POA: Diagnosis not present

## 2023-03-29 ENCOUNTER — Telehealth: Payer: Self-pay | Admitting: *Deleted

## 2023-03-29 DIAGNOSIS — M1712 Unilateral primary osteoarthritis, left knee: Secondary | ICD-10-CM | POA: Diagnosis not present

## 2023-03-29 NOTE — Telephone Encounter (Signed)
Patient is scheduled for Labwork on 04/10/23, no orders are in Epic.   Patient has a 6 Month follow up on 04/13/2023.   Please Place orders.

## 2023-03-30 NOTE — Telephone Encounter (Signed)
Patient notified and stated that if Shanda Bumps thinks he doesn't need any, he will just cancel the appointment for the 7th.   Appointment canceled and confirmed appointment with Shanda Bumps for the 10th

## 2023-03-30 NOTE — Telephone Encounter (Signed)
I did not have him come in prior for labs, not sure why that appt was made, if he wants to come in prior I will place orders

## 2023-04-02 DIAGNOSIS — M1712 Unilateral primary osteoarthritis, left knee: Secondary | ICD-10-CM | POA: Diagnosis not present

## 2023-04-10 ENCOUNTER — Other Ambulatory Visit: Payer: Medicare Other

## 2023-04-10 DIAGNOSIS — M1712 Unilateral primary osteoarthritis, left knee: Secondary | ICD-10-CM | POA: Diagnosis not present

## 2023-04-10 DIAGNOSIS — R262 Difficulty in walking, not elsewhere classified: Secondary | ICD-10-CM | POA: Diagnosis not present

## 2023-04-10 DIAGNOSIS — M25662 Stiffness of left knee, not elsewhere classified: Secondary | ICD-10-CM | POA: Diagnosis not present

## 2023-04-10 DIAGNOSIS — M6281 Muscle weakness (generalized): Secondary | ICD-10-CM | POA: Diagnosis not present

## 2023-04-13 ENCOUNTER — Encounter: Payer: Self-pay | Admitting: Nurse Practitioner

## 2023-04-13 ENCOUNTER — Ambulatory Visit (INDEPENDENT_AMBULATORY_CARE_PROVIDER_SITE_OTHER): Payer: Medicare Other | Admitting: Nurse Practitioner

## 2023-04-13 VITALS — BP 124/78 | HR 76 | Temp 97.1°F | Ht 75.0 in | Wt 313.0 lb

## 2023-04-13 DIAGNOSIS — M170 Bilateral primary osteoarthritis of knee: Secondary | ICD-10-CM

## 2023-04-13 DIAGNOSIS — E0843 Diabetes mellitus due to underlying condition with diabetic autonomic (poly)neuropathy: Secondary | ICD-10-CM | POA: Diagnosis not present

## 2023-04-13 DIAGNOSIS — I10 Essential (primary) hypertension: Secondary | ICD-10-CM

## 2023-04-13 DIAGNOSIS — E782 Mixed hyperlipidemia: Secondary | ICD-10-CM

## 2023-04-13 DIAGNOSIS — D649 Anemia, unspecified: Secondary | ICD-10-CM

## 2023-04-13 LAB — CBC WITH DIFFERENTIAL/PLATELET
Absolute Lymphocytes: 3900 {cells}/uL (ref 850–3900)
Absolute Monocytes: 428 {cells}/uL (ref 200–950)
Basophils Absolute: 30 {cells}/uL (ref 0–200)
Basophils Relative: 0.4 %
Eosinophils Absolute: 180 {cells}/uL (ref 15–500)
Eosinophils Relative: 2.4 %
HCT: 37 % — ABNORMAL LOW (ref 38.5–50.0)
Hemoglobin: 11 g/dL — ABNORMAL LOW (ref 13.2–17.1)
MCH: 22.9 pg — ABNORMAL LOW (ref 27.0–33.0)
MCHC: 29.7 g/dL — ABNORMAL LOW (ref 32.0–36.0)
MCV: 76.9 fL — ABNORMAL LOW (ref 80.0–100.0)
MPV: 11.6 fL (ref 7.5–12.5)
Monocytes Relative: 5.7 %
Neutro Abs: 2963 {cells}/uL (ref 1500–7800)
Neutrophils Relative %: 39.5 %
Platelets: 270 10*3/uL (ref 140–400)
RBC: 4.81 10*6/uL (ref 4.20–5.80)
RDW: 14.2 % (ref 11.0–15.0)
Total Lymphocyte: 52 %
WBC: 7.5 10*3/uL (ref 3.8–10.8)

## 2023-04-13 NOTE — Progress Notes (Signed)
 Careteam: Patient Care Team: Caro Harlene POUR, NP as PCP - General (Geriatric Medicine) Rollin Dover, MD as Consulting Physician (Gastroenterology) Onesimo Emaline Brink, MD as Consulting Physician (Hematology) Jane Charleston, MD as Consulting Physician (Orthopedic Surgery)  PLACE OF SERVICE:  [facility]  Advanced Directive information    No Known Allergies  Chief Complaint  Patient presents with   Medical Management of Chronic Issues    6 month follow-up, foot exam today. Discuss need for  covid booster, and diabetic kidney evaluation       HPI: Patient is a 68 y.o. male for routine follow up  Has knee replacement 12/4 Doing outpatient PT. Limited ROM to left knee but getting better Using oxy IR and tylenol .  No longer on blood thinner No constipation due to pain medication Reports he is doing well  Healthy as an old horse  Continues to work on weight loss.   Review of Systems:  Review of Systems  Constitutional:  Negative for chills, fever and weight loss.  HENT:  Negative for tinnitus.   Respiratory:  Negative for cough, sputum production and shortness of breath.   Cardiovascular:  Negative for chest pain, palpitations and leg swelling.  Gastrointestinal:  Negative for abdominal pain, constipation, diarrhea and heartburn.  Genitourinary:  Negative for dysuria, frequency and urgency.  Musculoskeletal:  Positive for joint pain. Negative for back pain, falls and myalgias.  Skin: Negative.   Neurological:  Negative for dizziness and headaches.  Psychiatric/Behavioral:  Negative for depression and memory loss. The patient does not have insomnia.     Past Medical History:  Diagnosis Date   Anemia    Arthritis    Chest pain    Per incoming records from [facility]    Diabetes mellitus without complication (HCC)    Epilepsy (HCC)    Hypertension    Obesity, morbid, BMI 50 or higher (HCC)    Per incoming records from [facility]    Type 2 diabetes  mellitus [facility])    Past Surgical History:  Procedure Laterality Date   COLONOSCOPY WITH PROPOFOL  N/A 11/01/2018   Procedure: COLONOSCOPY WITH PROPOFOL ;  Surgeon: Rollin Dover, MD;  Location: [facility];  Service: Endoscopy;  Laterality: N/A;   KNEE ARTHROSCOPY Right 2005   Dr Gwinda   POLYPECTOMY  11/01/2018   Procedure: POLYPECTOMY;  Surgeon: Rollin Dover, MD;  Location: [facility];  Service: Endoscopy;;   TOTAL KNEE ARTHROPLASTY Left 03/07/2023   Procedure: TOTAL KNEE ARTHROPLASTY;  Surgeon: Edna Toribio LABOR, MD;  Location: [facility] ORS;  Service: Orthopedics;  Laterality: Left;   Social History:   reports that he quit smoking about 18 years ago. His smoking use included cigarettes. He has never been exposed to tobacco smoke. He has never used smokeless tobacco. He reports that he does not currently use alcohol . He reports that he does not use drugs.  History reviewed. No pertinent family history.  Medications: Patient's Medications  New Prescriptions   No medications on file  Previous Medications   AMLODIPINE  (NORVASC ) 10 MG TABLET    Take 1 tablet by mouth once daily   ATORVASTATIN  (LIPITOR) 20 MG TABLET    Take 1 tablet by mouth once daily   BENZONATATE  (TESSALON ) 100 MG CAPSULE    Take 1 capsule (100 mg total) by mouth every 8 (eight) hours as needed for cough.   HYDROCHLOROTHIAZIDE  (HYDRODIURIL ) 25 MG TABLET    Take 1 tablet by mouth once daily   JARDIANCE  10 MG TABS TABLET  TAKE 1 TABLET BY MOUTH ONCE DAILY BEFORE BREAKFAST   LISINOPRIL  (ZESTRIL ) 10 MG TABLET    Take 1 tablet by mouth once daily   METFORMIN  (GLUCOPHAGE ) 1000 MG TABLET    TAKE 1 TABLET BY MOUTH TWICE DAILY WITH A MEAL  Modified Medications   No medications on file  Discontinued Medications   No medications on file    Physical Exam:  Vitals:   04/13/23 1114  BP: 124/78  Pulse: 76  Temp: (!) 97.1 F (36.2 C)  TempSrc: Temporal  SpO2: 99%  Weight: (!) 313 lb (142 kg)  Height: 6' 3 (1.905 m)    Body mass index is 39.12 kg/m. Wt Readings from Last 3 Encounters:  04/13/23 (!) 313 lb (142 kg)  03/07/23 (!) 319 lb 10.7 oz (145 kg)  02/22/23 (!) 319 lb 10.7 oz (145 kg)    Physical Exam Constitutional:      General: He is not in acute distress.    Appearance: He is well-developed. He is not diaphoretic.  HENT:     Head: Normocephalic and atraumatic.     Right Ear: External ear normal.     Left Ear: External ear normal.     Mouth/Throat:     Pharynx: No oropharyngeal exudate.  Eyes:     Conjunctiva/sclera: Conjunctivae normal.     Pupils: Pupils are equal, round, and reactive to light.  Cardiovascular:     Rate and Rhythm: Normal rate and regular rhythm.     Heart sounds: Normal heart sounds.  Pulmonary:     Effort: Pulmonary effort is normal.     Breath sounds: Normal breath sounds.  Abdominal:     General: Bowel sounds are normal.     Palpations: Abdomen is soft.  Musculoskeletal:        General: No tenderness.     Cervical back: Normal range of motion and neck supple.     Right lower leg: No edema.     Left lower leg: No edema.  Skin:    General: Skin is warm and dry.  Neurological:     Mental Status: He is alert and oriented to person, place, and time.     Labs reviewed: Basic Metabolic Panel: Recent Labs    11/24/22 1121 02/22/23 1058 03/08/23 0333  NA 136 135 136  K 3.6 3.7 4.2  CL 101 99 105  CO2 27 28 23   GLUCOSE 102* 99 131*  BUN 14 13 20   CREATININE 1.01 0.90 0.86  CALCIUM  9.3 9.5 8.8*   Liver Function Tests: Recent Labs    10/04/22 0813 11/24/22 1121 02/22/23 1058  AST 13 17 17   ALT 7* 10 9  ALKPHOS  --  99 103  BILITOT 0.4 0.6 0.4  PROT 7.4 7.9 7.8  ALBUMIN  --  4.0 3.9   No results for input(s): LIPASE, AMYLASE in the last 8760 hours. No results for input(s): AMMONIA in the last 8760 hours. CBC: Recent Labs    10/04/22 0813 11/24/22 1121 02/22/23 1058 03/08/23 0333  WBC 9.2 9.2 9.0 13.1*  NEUTROABS 4,287 2.9  3.5  --   HGB 12.0* 12.4* 12.1* 10.6*  HCT 39.3 41.6 41.4 35.6*  MCV 76.5* 79.4* 80.1 79.6*  PLT 256 268 334 219   Lipid Panel: Recent Labs    10/04/22 0813  CHOL 121  HDL 43  LDLCALC 64  TRIG 62  CHOLHDL 2.8   TSH: No results for input(s): TSH in the last 8760 hours. A1C: Lab  Results  Component Value Date   HGBA1C 6.4 (H) 02/22/2023     Assessment/Plan.diagmed 1. Anemia, unspecified type (Primary) Likely worsened from post op blood loss, will follow up cbc today - CBC with Differential/Platelet  2. Mixed hyperlipidemia Continues on lipitor with dietary modifications  lDL at goal in July, will follow up next visit.   3. Essential hypertension -Blood pressure well controlled, goal bp <140/90 Continue current medications and dietary modifications follow metabolic panel  4. Diabetes mellitus due to underlying condition with diabetic autonomic neuropathy, without long-term current use of insulin  (HCC) -Encouraged dietary compliance, routine foot care/monitoring and to keep up with diabetic eye exams through ophthalmology  Continues on metformin  and jardiance , A1c at goal in November.  - Urine Albumin/Creatinine with ratio (send out) [LAB689]  5. Morbid obesity (HCC) Continues to work on weight loss through diet and exercise.   6. Bilateral primary osteoarthritis of knee -s/p total left knee, continued follow up with ortho and PT Pain controlled    Return in about 6 months (around 10/11/2023) for routine follow up.  Eldo Umanzor K. Caro BODILY [facility] [phone]

## 2023-04-18 DIAGNOSIS — M6281 Muscle weakness (generalized): Secondary | ICD-10-CM | POA: Diagnosis not present

## 2023-04-18 DIAGNOSIS — R262 Difficulty in walking, not elsewhere classified: Secondary | ICD-10-CM | POA: Diagnosis not present

## 2023-04-18 DIAGNOSIS — M1712 Unilateral primary osteoarthritis, left knee: Secondary | ICD-10-CM | POA: Diagnosis not present

## 2023-04-18 DIAGNOSIS — M25662 Stiffness of left knee, not elsewhere classified: Secondary | ICD-10-CM | POA: Diagnosis not present

## 2023-04-19 DIAGNOSIS — M1712 Unilateral primary osteoarthritis, left knee: Secondary | ICD-10-CM | POA: Diagnosis not present

## 2023-04-20 DIAGNOSIS — M1712 Unilateral primary osteoarthritis, left knee: Secondary | ICD-10-CM | POA: Diagnosis not present

## 2023-04-20 DIAGNOSIS — M25662 Stiffness of left knee, not elsewhere classified: Secondary | ICD-10-CM | POA: Diagnosis not present

## 2023-04-20 DIAGNOSIS — M6281 Muscle weakness (generalized): Secondary | ICD-10-CM | POA: Diagnosis not present

## 2023-04-20 DIAGNOSIS — R262 Difficulty in walking, not elsewhere classified: Secondary | ICD-10-CM | POA: Diagnosis not present

## 2023-04-24 DIAGNOSIS — M1712 Unilateral primary osteoarthritis, left knee: Secondary | ICD-10-CM | POA: Diagnosis not present

## 2023-04-24 DIAGNOSIS — M6281 Muscle weakness (generalized): Secondary | ICD-10-CM | POA: Diagnosis not present

## 2023-04-24 DIAGNOSIS — M25662 Stiffness of left knee, not elsewhere classified: Secondary | ICD-10-CM | POA: Diagnosis not present

## 2023-04-24 DIAGNOSIS — R262 Difficulty in walking, not elsewhere classified: Secondary | ICD-10-CM | POA: Diagnosis not present

## 2023-04-27 DIAGNOSIS — M25662 Stiffness of left knee, not elsewhere classified: Secondary | ICD-10-CM | POA: Diagnosis not present

## 2023-04-27 DIAGNOSIS — M1712 Unilateral primary osteoarthritis, left knee: Secondary | ICD-10-CM | POA: Diagnosis not present

## 2023-04-27 DIAGNOSIS — M6281 Muscle weakness (generalized): Secondary | ICD-10-CM | POA: Diagnosis not present

## 2023-04-27 DIAGNOSIS — R262 Difficulty in walking, not elsewhere classified: Secondary | ICD-10-CM | POA: Diagnosis not present

## 2023-05-01 DIAGNOSIS — M6281 Muscle weakness (generalized): Secondary | ICD-10-CM | POA: Diagnosis not present

## 2023-05-01 DIAGNOSIS — M25662 Stiffness of left knee, not elsewhere classified: Secondary | ICD-10-CM | POA: Diagnosis not present

## 2023-05-01 DIAGNOSIS — R262 Difficulty in walking, not elsewhere classified: Secondary | ICD-10-CM | POA: Diagnosis not present

## 2023-05-01 DIAGNOSIS — M1712 Unilateral primary osteoarthritis, left knee: Secondary | ICD-10-CM | POA: Diagnosis not present

## 2023-05-03 DIAGNOSIS — M25662 Stiffness of left knee, not elsewhere classified: Secondary | ICD-10-CM | POA: Diagnosis not present

## 2023-05-03 DIAGNOSIS — R262 Difficulty in walking, not elsewhere classified: Secondary | ICD-10-CM | POA: Diagnosis not present

## 2023-05-03 DIAGNOSIS — M1712 Unilateral primary osteoarthritis, left knee: Secondary | ICD-10-CM | POA: Diagnosis not present

## 2023-05-03 DIAGNOSIS — M6281 Muscle weakness (generalized): Secondary | ICD-10-CM | POA: Diagnosis not present

## 2023-05-08 DIAGNOSIS — M1712 Unilateral primary osteoarthritis, left knee: Secondary | ICD-10-CM | POA: Diagnosis not present

## 2023-05-08 DIAGNOSIS — M25662 Stiffness of left knee, not elsewhere classified: Secondary | ICD-10-CM | POA: Diagnosis not present

## 2023-05-08 DIAGNOSIS — R262 Difficulty in walking, not elsewhere classified: Secondary | ICD-10-CM | POA: Diagnosis not present

## 2023-05-08 DIAGNOSIS — M6281 Muscle weakness (generalized): Secondary | ICD-10-CM | POA: Diagnosis not present

## 2023-05-11 DIAGNOSIS — M25662 Stiffness of left knee, not elsewhere classified: Secondary | ICD-10-CM | POA: Diagnosis not present

## 2023-05-11 DIAGNOSIS — M6281 Muscle weakness (generalized): Secondary | ICD-10-CM | POA: Diagnosis not present

## 2023-05-11 DIAGNOSIS — M1712 Unilateral primary osteoarthritis, left knee: Secondary | ICD-10-CM | POA: Diagnosis not present

## 2023-05-11 DIAGNOSIS — R262 Difficulty in walking, not elsewhere classified: Secondary | ICD-10-CM | POA: Diagnosis not present

## 2023-05-16 ENCOUNTER — Other Ambulatory Visit: Payer: Self-pay | Admitting: Nurse Practitioner

## 2023-05-16 DIAGNOSIS — E0843 Diabetes mellitus due to underlying condition with diabetic autonomic (poly)neuropathy: Secondary | ICD-10-CM

## 2023-05-16 DIAGNOSIS — I10 Essential (primary) hypertension: Secondary | ICD-10-CM

## 2023-07-09 ENCOUNTER — Other Ambulatory Visit: Payer: Self-pay | Admitting: Nurse Practitioner

## 2023-07-09 DIAGNOSIS — E782 Mixed hyperlipidemia: Secondary | ICD-10-CM

## 2023-07-17 DIAGNOSIS — M1711 Unilateral primary osteoarthritis, right knee: Secondary | ICD-10-CM | POA: Diagnosis not present

## 2023-07-23 ENCOUNTER — Encounter: Payer: Self-pay | Admitting: Nurse Practitioner

## 2023-07-23 ENCOUNTER — Ambulatory Visit: Admitting: Nurse Practitioner

## 2023-07-23 VITALS — BP 134/76 | HR 76 | Temp 97.8°F | Ht 75.0 in | Wt 324.2 lb

## 2023-07-23 DIAGNOSIS — D649 Anemia, unspecified: Secondary | ICD-10-CM

## 2023-07-23 DIAGNOSIS — L309 Dermatitis, unspecified: Secondary | ICD-10-CM

## 2023-07-23 DIAGNOSIS — M170 Bilateral primary osteoarthritis of knee: Secondary | ICD-10-CM

## 2023-07-23 DIAGNOSIS — I10 Essential (primary) hypertension: Secondary | ICD-10-CM

## 2023-07-23 DIAGNOSIS — Z01818 Encounter for other preprocedural examination: Secondary | ICD-10-CM | POA: Insufficient documentation

## 2023-07-23 DIAGNOSIS — E0843 Diabetes mellitus due to underlying condition with diabetic autonomic (poly)neuropathy: Secondary | ICD-10-CM

## 2023-07-23 DIAGNOSIS — E782 Mixed hyperlipidemia: Secondary | ICD-10-CM | POA: Diagnosis not present

## 2023-07-23 NOTE — Progress Notes (Signed)
 Careteam: Patient Care Team: Verma Gobble, NP as PCP - General (Geriatric Medicine) Alvis Jourdain, MD as Consulting Physician (Gastroenterology) Frankie Israel, MD as Consulting Physician (Hematology) Elly Habermann, MD as Consulting Physician (Orthopedic Surgery)  PLACE OF SERVICE:  [facility]  Advanced Directive information Does Patient Have a Medical Advance Directive?: Yes, Type of Advance Directive: Living will, Does patient want to make changes to medical advance directive?: No - Patient declined  No Known Allergies  Chief Complaint  Patient presents with   Pre-op Exam    Surgical clearance. Having Knee Replacement with Daphney Eans on 09/05/2023.   Medical Management of Chronic Issues    Routine follow-up. Discussed need for covid booster, eye exam, and diabetic kidney evaluation      HPI:  Discussed the use of AI scribe software for clinical note transcription with the patient, who gave verbal consent to proceed.  History of Present Illness   Michael [name]. "Michael [name]" is a 68 year old male who presents for a preoperative evaluation and blood work update.  He is preparing for knee replacement surgery and requires updated blood work, including an A1c test. He has gained weight since his last visit, currently weighing 324 pounds, up from 313 pounds, and acknowledges the need to lose weight before surgery. He has not been able to go to the gym as much as he was previously due to knee pain.   He experiences significant pain in his right knee, which is scheduled for replacement. No chest pain, shortness of breath, or worsening lower leg swelling. However, his left leg remains slightly swollen from a previous surgery, but there is no swelling in the right leg.  He has a rash on his left leg, which he attributes to the previous surgery. The rash improves with lotion application, and he has not experienced any pain associated with it. No issues with  constipation or diarrhea and feels his usual self without any new symptoms to report.  He had a head cold last week with a stuffy nose but no fever, which he attributes to weather changes. He denies having any known allergies but considers the possibility of undiagnosed allergies.  He has not had an eye exam since last year and mentions that his previous eye doctor retired, leaving him without a regular eye care provider.     Review of Systems:  Review of Systems  Constitutional:  Negative for chills, fever and weight loss.  HENT:  Negative for tinnitus.   Respiratory:  Negative for cough, sputum production and shortness of breath.   Cardiovascular:  Negative for chest pain, palpitations and leg swelling.  Gastrointestinal:  Negative for abdominal pain, constipation, diarrhea and heartburn.  Genitourinary:  Negative for dysuria, frequency and urgency.  Musculoskeletal:  Negative for back pain, falls, joint pain and myalgias.  Skin: Negative.   Neurological:  Negative for dizziness and headaches.  Psychiatric/Behavioral:  Negative for depression and memory loss. The patient does not have insomnia.     Past Medical History:  Diagnosis Date   Anemia    Arthritis    Chest pain    Per incoming records from [facility]    Diabetes mellitus without complication (HCC)    Epilepsy (HCC)    Hypertension    Obesity, morbid, BMI 50 or higher (HCC)    Per incoming records from [facility]    Type 2 diabetes mellitus [facility])    Past Surgical History:  Procedure Laterality Date  COLONOSCOPY WITH PROPOFOL  N/A 11/01/2018   Procedure: COLONOSCOPY WITH PROPOFOL ;  Surgeon: Alvis Jourdain, MD;  Location: [facility];  Service: Endoscopy;  Laterality: N/A;   KNEE ARTHROSCOPY Right 2005   Dr Apolonio Bay   POLYPECTOMY  11/01/2018   Procedure: POLYPECTOMY;  Surgeon: Alvis Jourdain, MD;  Location: [facility];  Service: Endoscopy;;   TOTAL KNEE ARTHROPLASTY Left 03/07/2023   Procedure: TOTAL KNEE  ARTHROPLASTY;  Surgeon: Murleen Arms, MD;  Location: [facility];  Service: Orthopedics;  Laterality: Left;   Social History:   reports that he quit smoking about 18 years ago. His smoking use included cigarettes. He has never been exposed to tobacco smoke. He has never used smokeless tobacco. He reports that he does not currently use alcohol . He reports that he does not use drugs.  History reviewed. No pertinent family history.  Medications: Patient's Medications  New Prescriptions   No medications on file  Previous Medications   AMLODIPINE  (NORVASC ) 10 MG TABLET    Take 1 tablet by mouth once daily   ATORVASTATIN  (LIPITOR) 20 MG TABLET    Take 1 tablet by mouth once daily   HYDROCHLOROTHIAZIDE  (HYDRODIURIL ) 25 MG TABLET    Take 1 tablet by mouth once daily   JARDIANCE  10 MG TABS TABLET    TAKE 1 TABLET BY MOUTH ONCE DAILY BEFORE BREAKFAST   LISINOPRIL  (ZESTRIL ) 10 MG TABLET    Take 1 tablet by mouth once daily   METFORMIN  (GLUCOPHAGE ) 1000 MG TABLET    TAKE 1 TABLET BY MOUTH TWICE DAILY WITH A MEAL  Modified Medications   No medications on file  Discontinued Medications   BENZONATATE  (TESSALON ) 100 MG CAPSULE    Take 1 capsule (100 mg total) by mouth every 8 (eight) hours as needed for cough.    Physical Exam:  Vitals:   07/23/23 0904  BP: 134/76  Pulse: 76  Temp: 97.8 F (36.6 C)  SpO2: 98%  Weight: (!) 324 lb 3.2 oz (147.1 kg)  Height: 6\' 3"  (1.905 m)   Body mass index is 40.52 kg/m. Wt Readings from Last 3 Encounters:  07/23/23 (!) 324 lb 3.2 oz (147.1 kg)  04/13/23 (!) 313 lb (142 kg)  03/07/23 (!) 319 lb 10.7 oz (145 kg)    Physical Exam Constitutional:      General: He is not in acute distress.    Appearance: He is well-developed. He is not diaphoretic.  HENT:     Head: Normocephalic and atraumatic.     Right Ear: External ear normal.     Left Ear: External ear normal.     Mouth/Throat:     Pharynx: No oropharyngeal exudate.  Eyes:      Conjunctiva/sclera: Conjunctivae normal.     Pupils: Pupils are equal, round, and reactive to light.  Cardiovascular:     Rate and Rhythm: Normal rate and regular rhythm.     Heart sounds: Normal heart sounds.  Pulmonary:     Effort: Pulmonary effort is normal.     Breath sounds: Normal breath sounds.  Abdominal:     General: Bowel sounds are normal.     Palpations: Abdomen is soft.  Musculoskeletal:        General: No tenderness.     Cervical back: Normal range of motion and neck supple.     Right lower leg: No edema.     Left lower leg: No edema.  Skin:    General: Skin is warm and dry.  Neurological:  Mental Status: He is alert and oriented to person, place, and time.  Psychiatric:        Mood and Affect: Mood normal.        Behavior: Behavior normal.     Labs reviewed: Basic Metabolic Panel: Recent Labs    02/22/23 1058 03/08/23 0333 07/23/23 1331  NA 135 136 139  K 3.7 4.2 3.9  CL 99 105 101  CO2 28 23 29   GLUCOSE 99 131* 93  BUN 13 20 16   CREATININE 0.90 0.86 0.89  CALCIUM  9.5 8.8* 9.7   Liver Function Tests: Recent Labs    11/24/22 1121 02/22/23 1058 07/23/23 1331  AST 17 17 13   ALT 10 9 4*  ALKPHOS 99 103  --   BILITOT 0.6 0.4 0.4  PROT 7.9 7.8 7.6  ALBUMIN 4.0 3.9  --    No results for input(s): "LIPASE", "AMYLASE" in the last 8760 hours. No results for input(s): "AMMONIA" in the last 8760 hours. CBC: Recent Labs    02/22/23 1058 03/08/23 0333 04/13/23 1149 07/23/23 1331  WBC 9.0 13.1* 7.5 8.2  NEUTROABS 3.5  --  2,963 3,190  HGB 12.1* 10.6* 11.0* 11.8*  HCT 41.4 35.6* 37.0* 39.1  MCV 80.1 79.6* 76.9* 75.6*  PLT 334 219 270 248   Lipid Panel: Recent Labs    10/04/22 0813 07/23/23 1331  CHOL 121 133  HDL 43 49  LDLCALC 64 68  TRIG 62 78  CHOLHDL 2.8 2.7   TSH: No results for input(s): "TSH" in the last 8760 hours. A1C: Lab Results  Component Value Date   HGBA1C 6.4 (H) 07/23/2023     Assessment/Plan  Eczema on  left leg Rash consistent with eczema, improves with lotion. - Advise continued use of lotion consistently   Anemia, unspecified type Assessment & Plan: Has been stable, no signs of blood loss  will update CBC  Orders: -     CBC with Differential/Platelet  Essential hypertension Assessment & Plan: Blood pressure well controlled, goal bp <140/90 Continue current medications and dietary modifications follow metabolic panel  Orders: -     COMPLETE METABOLIC PANEL WITHOUT GFR -     CBC with Differential/Platelet  Diabetes mellitus due to underlying condition with diabetic autonomic neuropathy, without long-term current use of insulin  (HCC) Assessment & Plan: Encouraged dietary compliance, routine foot care/monitoring and to keep up with diabetic eye exams through ophthalmology  A1c has been in controlled ranged Will follow up today  Orders: -     Microalbumin / creatinine urine ratio -     Hemoglobin A1c  Morbid obesity (HCC) Assessment & Plan: -education provided on healthy weight loss through increase in physical activity and proper nutrition    Bilateral primary osteoarthritis of knee Assessment & Plan: S/p left knee replacement and did well with surgery Now looking to schedule right knee replacement. Discussed getting weight down to appropriate BMI for surgery.    Mixed hyperlipidemia Assessment & Plan: Continues on lipitor LDL was at goal in July  Orders: -     Lipid panel  Pre-op exam Assessment & Plan: Completed today, he is at increased risk due to weight but reports he will lose weight prior to surgery date.  No acute concerns that would cause surgery to be postponed at this time.    Other orders -     Lipid panel     Return in about 6 months (around 01/22/2024) for routine follow up, labs prior to visit.:  Michael [name] Fisherman [facility] [phone]

## 2023-07-23 NOTE — Patient Instructions (Signed)
 Okay to move appt to OCTOBER (6 months out) Labs prior to appt

## 2023-07-24 ENCOUNTER — Encounter: Payer: Self-pay | Admitting: Nurse Practitioner

## 2023-07-24 LAB — COMPLETE METABOLIC PANEL WITHOUT GFR
AG Ratio: 1.2 (calc) (ref 1.0–2.5)
ALT: 4 U/L — ABNORMAL LOW (ref 9–46)
AST: 13 U/L (ref 10–35)
Albumin: 4.2 g/dL (ref 3.6–5.1)
Alkaline phosphatase (APISO): 113 U/L (ref 35–144)
BUN: 16 mg/dL (ref 7–25)
CO2: 29 mmol/L (ref 20–32)
Calcium: 9.7 mg/dL (ref 8.6–10.3)
Chloride: 101 mmol/L (ref 98–110)
Creat: 0.89 mg/dL (ref 0.70–1.35)
Globulin: 3.4 g/dL (ref 1.9–3.7)
Glucose, Bld: 93 mg/dL (ref 65–139)
Potassium: 3.9 mmol/L (ref 3.5–5.3)
Sodium: 139 mmol/L (ref 135–146)
Total Bilirubin: 0.4 mg/dL (ref 0.2–1.2)
Total Protein: 7.6 g/dL (ref 6.1–8.1)

## 2023-07-24 LAB — HEMOGLOBIN A1C
Hgb A1c MFr Bld: 6.4 % — ABNORMAL HIGH (ref <5.7)
Mean Plasma Glucose: 137 mg/dL
eAG (mmol/L): 7.6 mmol/L

## 2023-07-24 LAB — CBC WITH DIFFERENTIAL/PLATELET
Absolute Lymphocytes: 4305 {cells}/uL — ABNORMAL HIGH (ref 850–3900)
Absolute Monocytes: 459 {cells}/uL (ref 200–950)
Basophils Absolute: 41 {cells}/uL (ref 0–200)
Basophils Relative: 0.5 %
Eosinophils Absolute: 205 {cells}/uL (ref 15–500)
Eosinophils Relative: 2.5 %
HCT: 39.1 % (ref 38.5–50.0)
Hemoglobin: 11.8 g/dL — ABNORMAL LOW (ref 13.2–17.1)
MCH: 22.8 pg — ABNORMAL LOW (ref 27.0–33.0)
MCHC: 30.2 g/dL — ABNORMAL LOW (ref 32.0–36.0)
MCV: 75.6 fL — ABNORMAL LOW (ref 80.0–100.0)
MPV: 12 fL (ref 7.5–12.5)
Monocytes Relative: 5.6 %
Neutro Abs: 3190 {cells}/uL (ref 1500–7800)
Neutrophils Relative %: 38.9 %
Platelets: 248 10*3/uL (ref 140–400)
RBC: 5.17 10*6/uL (ref 4.20–5.80)
RDW: 14.9 % (ref 11.0–15.0)
Total Lymphocyte: 52.5 %
WBC: 8.2 10*3/uL (ref 3.8–10.8)

## 2023-07-24 LAB — LIPID PANEL
Cholesterol: 133 mg/dL (ref <200)
HDL: 49 mg/dL (ref >=40)
LDL Cholesterol (Calc): 68 mg/dL
Non-HDL Cholesterol (Calc): 84 mg/dL (ref <130)
Total CHOL/HDL Ratio: 2.7 (calc) (ref <5.0)
Triglycerides: 78 mg/dL (ref <150)

## 2023-07-24 LAB — MICROALBUMIN / CREATININE URINE RATIO
Creatinine, Urine: 107 mg/dL (ref 20–320)
Microalb Creat Ratio: 4 mg/g{creat} (ref <30)
Microalb, Ur: 0.4 mg/dL

## 2023-07-26 NOTE — Assessment & Plan Note (Signed)
 Blood pressure well controlled, goal bp <140/90 Continue current medications and dietary modifications follow metabolic panel

## 2023-07-26 NOTE — Assessment & Plan Note (Signed)
 S/p left knee replacement and did well with surgery Now looking to schedule right knee replacement. Discussed getting weight down to appropriate BMI for surgery.

## 2023-07-26 NOTE — Assessment & Plan Note (Signed)
 Has been stable, no signs of blood loss  will update CBC

## 2023-07-26 NOTE — Assessment & Plan Note (Signed)
 Completed today, he is at increased risk due to weight but reports he will lose weight prior to surgery date.  No acute concerns that would cause surgery to be postponed at this time.

## 2023-07-26 NOTE — Assessment & Plan Note (Signed)
 Encouraged dietary compliance, routine foot care/monitoring and to keep up with diabetic eye exams through ophthalmology  A1c has been in controlled ranged Will follow up today

## 2023-07-26 NOTE — Assessment & Plan Note (Signed)
-  education provided on healthy weight loss through increase in physical activity and proper nutrition

## 2023-07-26 NOTE — Assessment & Plan Note (Signed)
 Continues on lipitor LDL was at goal in July

## 2023-08-14 DIAGNOSIS — M1711 Unilateral primary osteoarthritis, right knee: Secondary | ICD-10-CM | POA: Diagnosis not present

## 2023-08-22 ENCOUNTER — Ambulatory Visit: Payer: Self-pay | Admitting: Emergency Medicine

## 2023-08-22 DIAGNOSIS — G8929 Other chronic pain: Secondary | ICD-10-CM

## 2023-08-22 NOTE — H&P (View-Only) (Signed)
 TOTAL KNEE ADMISSION H&P  Patient is being admitted for right total knee arthroplasty.  Subjective:  Chief Complaint:right knee pain.  HPI: Michael [name]., 68 y.o. male, has a history of pain and functional disability in the right knee due to arthritis and has failed non-surgical conservative treatments for greater than 12 weeks to includeNSAID's and/or analgesics, use of assistive devices, and activity modification.  Onset of symptoms was gradual, starting >10 years ago with gradually worsening course since that time. The patient noted prior procedures on the knee to include  arthroscopy on the right knee(s).  Patient currently rates pain in the right knee(s) at 8 out of 10 with activity. Patient has night pain, worsening of pain with activity and weight bearing, pain that interferes with activities of daily living, and pain with passive range of motion.  Patient has evidence of periarticular osteophytes and joint space narrowing by imaging studies. There is no active infection.  Patient Active Problem List   Diagnosis Date Noted   Bilateral primary osteoarthritis of knee 07/23/2023   Pre-op exam 07/23/2023   Primary osteoarthritis of left knee 03/07/2023   Diabetes mellitus due to underlying condition with diabetic autonomic neuropathy (HCC) 08/29/2017   Anemia 09/27/2016   Hyperlipidemia 09/27/2016   Morbid obesity (HCC) 09/27/2016   Essential hypertension 08/23/2016   Arthritis 08/21/2016   Past Medical History:  Diagnosis Date   Anemia    Arthritis    Chest pain    Per incoming records from [facility]    Diabetes mellitus without complication (HCC)    Epilepsy (HCC)    Hypertension    Obesity, morbid, BMI 50 or higher (HCC)    Per incoming records from [facility]    Type 2 diabetes mellitus (HCC)     Past Surgical History:  Procedure Laterality Date   COLONOSCOPY WITH PROPOFOL  N/A 11/01/2018   Procedure: COLONOSCOPY WITH PROPOFOL ;  Surgeon: Alvis Jourdain, MD;   Location: [facility];  Service: Endoscopy;  Laterality: N/A;   KNEE ARTHROSCOPY Right 2005   Dr Apolonio Bay   POLYPECTOMY  11/01/2018   Procedure: POLYPECTOMY;  Surgeon: Alvis Jourdain, MD;  Location: [facility];  Service: Endoscopy;;   TOTAL KNEE ARTHROPLASTY Left 03/07/2023   Procedure: TOTAL KNEE ARTHROPLASTY;  Surgeon: Murleen Arms, MD;  Location: [facility];  Service: Orthopedics;  Laterality: Left;    Current Outpatient Medications  Medication Sig Dispense Refill Last Dose/Taking   amLODipine  (NORVASC ) 10 MG tablet Take 1 tablet by mouth once daily 90 tablet 0    atorvastatin  (LIPITOR) 20 MG tablet Take 1 tablet by mouth once daily 90 tablet 0    hydrochlorothiazide  (HYDRODIURIL ) 25 MG tablet Take 1 tablet by mouth once daily 90 tablet 0    JARDIANCE  10 MG TABS tablet TAKE 1 TABLET BY MOUTH ONCE DAILY BEFORE BREAKFAST 90 tablet 0    lisinopril  (ZESTRIL ) 10 MG tablet Take 1 tablet by mouth once daily 90 tablet 0    metFORMIN  (GLUCOPHAGE ) 1000 MG tablet TAKE 1 TABLET BY MOUTH TWICE DAILY WITH A MEAL 180 tablet 0    No current facility-administered medications for this visit.   No Known Allergies  Social History   Tobacco Use   Smoking status: Former    Current packs/day: 0.00    Types: Cigarettes    Quit date: 12/02/2004    Years since quitting: 18.7    Passive exposure: Never   Smokeless tobacco: Never   Tobacco comments:    8-10 a day  off/on for 30 years  Substance Use Topics   Alcohol  use: Not Currently    Comment: quit 2005    No family history on file.   Review of Systems  Musculoskeletal:  Positive for arthralgias.  All other systems reviewed and are negative.   Objective:  Physical Exam Constitutional:      General: He is not in acute distress.    Appearance: Normal appearance. He is normal weight.  HENT:     Head: Normocephalic and atraumatic.  Eyes:     Extraocular Movements: Extraocular movements intact.     Conjunctiva/sclera: Conjunctivae normal.      Pupils: Pupils are equal, round, and reactive to light.  Cardiovascular:     Rate and Rhythm: Normal rate and regular rhythm.     Pulses: Normal pulses.     Heart sounds: Normal heart sounds.  Pulmonary:     Effort: Pulmonary effort is normal. No respiratory distress.     Breath sounds: Normal breath sounds.  Abdominal:     General: Bowel sounds are normal. There is no distension.     Palpations: Abdomen is soft.     Tenderness: There is no abdominal tenderness.  Musculoskeletal:        General: Tenderness present.     Cervical back: Normal range of motion and neck supple.     Comments: TTP over medial and lateral joint line, medial worse than lateral.  No calf tenderness, swelling, or erythema.  No overlying lesions of area of chief complaint.  Decreased strength and ROM due to elicited pain.  Dorsiflexion and plantarflexion intact.  Stable to varus and valgus stress.  BLE appear grossly neurovascularly intact.  Gait antalgic.   Lymphadenopathy:     Cervical: No cervical adenopathy.  Skin:    General: Skin is warm and dry.     Capillary Refill: Capillary refill takes less than 2 seconds.     Findings: No erythema or rash.  Neurological:     General: No focal deficit present.     Mental Status: He is alert and oriented to person, place, and time.  Psychiatric:        Mood and Affect: Mood normal.        Behavior: Behavior normal.     Vital signs in last 24 hours: @VSRANGES @  Labs:   Estimated body mass index is 40.52 kg/m as calculated from the following:   Height as of 07/23/23: 6\' 3"  (1.905 m).   Weight as of 07/23/23: 147.1 kg.   Imaging Review Plain radiographs demonstrate severe degenerative joint disease of the right knee(s). The overall alignment isvarus. The bone quality appears to be fair for age and reported activity level.      Assessment/Plan:  End stage arthritis, right knee   The patient history, physical examination, clinical judgment of the  provider and imaging studies are consistent with end stage degenerative joint disease of the right knee(s) and total knee arthroplasty is deemed medically necessary. The treatment options including medical management, injection therapy arthroscopy and arthroplasty were discussed at length. The risks and benefits of total knee arthroplasty were presented and reviewed. The risks due to aseptic loosening, infection, stiffness, patella tracking problems, thromboembolic complications and other imponderables were discussed. The patient acknowledged the explanation, agreed to proceed with the plan and consent was signed. Patient is being admitted for inpatient treatment for surgery, pain control, PT, OT, prophylactic antibiotics, VTE prophylaxis, progressive ambulation and ADL's and discharge planning. The patient is planning to  be discharged home with HHPT ([facility]).    Anticipated LOS equal to or greater than 2 midnights due to - Age 22 and older with one or more of the following:  - Obesity  - Expected need for hospital services (PT, OT, Nursing) required for safe  discharge  - Anticipated need for postoperative skilled nursing care or inpatient rehab  - Active co-morbidities: legally blind, diabetes, HTN OR   - Unanticipated findings during/Post Surgery: None  - Patient is a high risk of re-admission due to: None

## 2023-08-22 NOTE — H&P (Addendum)
 TOTAL KNEE ADMISSION H&P  Patient is being admitted for right total knee arthroplasty.  Subjective:  Chief Complaint:right knee pain.  HPI: Michael Vallery., 68 y.o. male, has a history of pain and functional disability in the right knee due to arthritis and has failed non-surgical conservative treatments for greater than 12 weeks to includeNSAID's and/or analgesics, use of assistive devices, and activity modification.  Onset of symptoms was gradual, starting >10 years ago with gradually worsening course since that time. The patient noted prior procedures on the knee to include  arthroscopy on the right knee(s).  Patient currently rates pain in the right knee(s) at 8 out of 10 with activity. Patient has night pain, worsening of pain with activity and weight bearing, pain that interferes with activities of daily living, and pain with passive range of motion.  Patient has evidence of periarticular osteophytes and joint space narrowing by imaging studies. There is no active infection.  Patient Active Problem List   Diagnosis Date Noted   Bilateral primary osteoarthritis of knee 07/23/2023   Pre-op exam 07/23/2023   Primary osteoarthritis of left knee 03/07/2023   Diabetes mellitus due to underlying condition with diabetic autonomic neuropathy (HCC) 08/29/2017   Anemia 09/27/2016   Hyperlipidemia 09/27/2016   Morbid obesity (HCC) 09/27/2016   Essential hypertension 08/23/2016   Arthritis 08/21/2016   Past Medical History:  Diagnosis Date   Anemia    Arthritis    Chest pain    Per incoming records from [facility]    Diabetes mellitus without complication (HCC)    Epilepsy (HCC)    Hypertension    Obesity, morbid, BMI 50 or higher (HCC)    Per incoming records from [facility]    Type 2 diabetes mellitus (HCC)     Past Surgical History:  Procedure Laterality Date   COLONOSCOPY WITH PROPOFOL  N/A 11/01/2018   Procedure: COLONOSCOPY WITH PROPOFOL ;  Surgeon: Alvis Jourdain, MD;   Location: [facility];  Service: Endoscopy;  Laterality: N/A;   KNEE ARTHROSCOPY Right 2005   Dr Apolonio Bay   POLYPECTOMY  11/01/2018   Procedure: POLYPECTOMY;  Surgeon: Alvis Jourdain, MD;  Location: [facility];  Service: Endoscopy;;   TOTAL KNEE ARTHROPLASTY Left 03/07/2023   Procedure: TOTAL KNEE ARTHROPLASTY;  Surgeon: Murleen Arms, MD;  Location: [facility];  Service: Orthopedics;  Laterality: Left;    Current Outpatient Medications  Medication Sig Dispense Refill Last Dose/Taking   amLODipine  (NORVASC ) 10 MG tablet Take 1 tablet by mouth once daily 90 tablet 0    atorvastatin  (LIPITOR) 20 MG tablet Take 1 tablet by mouth once daily 90 tablet 0    hydrochlorothiazide  (HYDRODIURIL ) 25 MG tablet Take 1 tablet by mouth once daily 90 tablet 0    JARDIANCE  10 MG TABS tablet TAKE 1 TABLET BY MOUTH ONCE DAILY BEFORE BREAKFAST 90 tablet 0    lisinopril  (ZESTRIL ) 10 MG tablet Take 1 tablet by mouth once daily 90 tablet 0    metFORMIN  (GLUCOPHAGE ) 1000 MG tablet TAKE 1 TABLET BY MOUTH TWICE DAILY WITH A MEAL 180 tablet 0    No current facility-administered medications for this visit.   No Known Allergies  Social History   Tobacco Use   Smoking status: Former    Current packs/day: 0.00    Types: Cigarettes    Quit date: 12/02/2004    Years since quitting: 18.7    Passive exposure: Never   Smokeless tobacco: Never   Tobacco comments:    8-10 a day  off/on for 30 years  Substance Use Topics   Alcohol  use: Not Currently    Comment: quit 2005    No family history on file.   Review of Systems  Musculoskeletal:  Positive for arthralgias.  All other systems reviewed and are negative.   Objective:  Physical Exam Constitutional:      General: He is not in acute distress.    Appearance: Normal appearance. He is normal weight.  HENT:     Head: Normocephalic and atraumatic.  Eyes:     Extraocular Movements: Extraocular movements intact.     Conjunctiva/sclera: Conjunctivae normal.      Pupils: Pupils are equal, round, and reactive to light.  Cardiovascular:     Rate and Rhythm: Normal rate and regular rhythm.     Pulses: Normal pulses.     Heart sounds: Normal heart sounds.  Pulmonary:     Effort: Pulmonary effort is normal. No respiratory distress.     Breath sounds: Normal breath sounds.  Abdominal:     General: Bowel sounds are normal. There is no distension.     Palpations: Abdomen is soft.     Tenderness: There is no abdominal tenderness.  Musculoskeletal:        General: Tenderness present.     Cervical back: Normal range of motion and neck supple.     Comments: TTP over medial and lateral joint line, medial worse than lateral.  No calf tenderness, swelling, or erythema.  No overlying lesions of area of chief complaint.  Decreased strength and ROM due to elicited pain.  Dorsiflexion and plantarflexion intact.  Stable to varus and valgus stress.  BLE appear grossly neurovascularly intact.  Gait antalgic.   Lymphadenopathy:     Cervical: No cervical adenopathy.  Skin:    General: Skin is warm and dry.     Capillary Refill: Capillary refill takes less than 2 seconds.     Findings: No erythema or rash.  Neurological:     General: No focal deficit present.     Mental Status: He is alert and oriented to person, place, and time.  Psychiatric:        Mood and Affect: Mood normal.        Behavior: Behavior normal.     Vital signs in last 24 hours: @VSRANGES @  Labs:   Estimated body mass index is 40.52 kg/m as calculated from the following:   Height as of 07/23/23: 6\' 3"  (1.905 m).   Weight as of 07/23/23: 147.1 kg.   Imaging Review Plain radiographs demonstrate severe degenerative joint disease of the right knee(s). The overall alignment isvarus. The bone quality appears to be fair for age and reported activity level.      Assessment/Plan:  End stage arthritis, right knee   The patient history, physical examination, clinical judgment of the  provider and imaging studies are consistent with end stage degenerative joint disease of the right knee(s) and total knee arthroplasty is deemed medically necessary. The treatment options including medical management, injection therapy arthroscopy and arthroplasty were discussed at length. The risks and benefits of total knee arthroplasty were presented and reviewed. The risks due to aseptic loosening, infection, stiffness, patella tracking problems, thromboembolic complications and other imponderables were discussed. The patient acknowledged the explanation, agreed to proceed with the plan and consent was signed. Patient is being admitted for inpatient treatment for surgery, pain control, PT, OT, prophylactic antibiotics, VTE prophylaxis, progressive ambulation and ADL's and discharge planning. The patient is planning to  be discharged home with HHPT ([facility]).    Anticipated LOS equal to or greater than 2 midnights due to - Age 22 and older with one or more of the following:  - Obesity  - Expected need for hospital services (PT, OT, Nursing) required for safe  discharge  - Anticipated need for postoperative skilled nursing care or inpatient rehab  - Active co-morbidities: legally blind, diabetes, HTN OR   - Unanticipated findings during/Post Surgery: None  - Patient is a high risk of re-admission due to: None

## 2023-08-23 ENCOUNTER — Encounter (HOSPITAL_COMMUNITY)

## 2023-08-27 NOTE — Progress Notes (Signed)
 COVID Vaccine received:  []  No [x]  Yes Date of any COVID positive Test in last 90 days:  PCP - [facility], NP at [facility]. Medical clearance in 07-23-23 Epic note Cardiologist -   Chest x-ray -  EKG -10-09-2022  Epic   Stress Test -  ECHO -  Cardiac Cath -  CT Coronary Calcium  score:   Pacemaker / ICD device [x]  No []  Yes   Spinal Cord Stimulator:[x]  No []  Yes       History of Sleep Apnea? []  No [x]  Yes   CPAP used?- []  No []  Yes    Does the patient monitor blood sugar?   []  N/A   []  No []  Yes  Patient has: []  NO Hx DM   []  Pre-DM   []  DM1  [x]   DM2 Last A1c was: 6.4  on  07-23-2023    Does patient have a [employer] or Dexcom? []  No []  Yes   Fasting Blood Sugar Ranges-  Checks Blood Sugar _____ times a day  Blood Thinner / Instructions:    none Aspirin  Instructions: none  ERAS Protocol Ordered: []  No  [x]  Yes PRE-SURGERY []  ENSURE  [x]  G2   Patient is to be NPO after: 0530  Dental hx: []  Dentures:  []  N/A      []  Bridge or Partial:                   []  Loose or Damaged teeth:   Comments: Patient was given the 5 CHG shower / bath instructions for  TKA surgery along with 2 bottles of the CHG soap. Patient will start this on:   Saturday 09-01-23           Activity level: Able to walk up 2 flights of stairs without becoming significantly short of breath or having chest pain?  []  No   [x]    Yes   Anesthesia review: HTN, anemia, DM2, OSA-   ? epilepsy  Patient denies shortness of breath, fever, cough and chest pain at PAT appointment.  Patient verbalized understanding and agreement to the Pre-Surgical Instructions that were given to them at this PAT appointment. Patient was also educated of the need to review these PAT instructions again prior to his surgery.I reviewed the appropriate phone numbers to call if they have any and questions or concerns.

## 2023-08-27 NOTE — Patient Instructions (Signed)
 SURGICAL WAITING ROOM VISITATION Patients having surgery or a procedure may have no more than 2 support people in the waiting area - these visitors may rotate in the visitor waiting room.   If the patient needs to stay at the hospital during part of their recovery, the visitor guidelines for inpatient rooms apply.  PRE-OP VISITATION  Pre-op nurse will coordinate an appropriate time for 1 support person to accompany the patient in pre-op.  This support person may not rotate.  This visitor will be contacted when the time is appropriate for the visitor to come back in the pre-op area.  Please refer to the [facility] website for the visitor guidelines for Inpatients (after your surgery is over and you are in a regular room).  You are not required to quarantine at this time prior to your surgery. However, you must do this: Hand Hygiene often Do NOT share personal items Notify your provider if you are in close contact with someone who has COVID or you develop fever 100.4 or greater, new onset of sneezing, cough, sore throat, shortness of breath or body aches.  If you test positive for Covid or have been in contact with anyone that has tested positive in the last 10 days please notify you surgeon.    Your procedure is scheduled on:  [facility]  September 05, 2023  Report to [facility] Main Entrance: [address] entrance where the [employer] is available.   Report to admitting at: 06:15    AM  Call this number if you have any questions or problems the morning of surgery [phone]  Do not eat food after Midnight the night prior to your surgery/procedure.  After Midnight you may have the following liquids until  05:30 AM DAY OF SURGERY  Clear Liquid Diet Water Black Coffee (sugar ok, NO MILK/CREAM OR CREAMERS)  Tea (sugar ok, NO MILK/CREAM OR CREAMERS) regular and decaf                             Plain Jell-O  with no fruit (NO RED)                                           Fruit ices  (not with fruit pulp, NO RED)                                     Popsicles (NO RED)                                                                  Juice: NO CITRUS JUICES: only apple, WHITE grape, WHITE cranberry Sports drinks like Gatorade or Powerade (NO RED)                   The day of surgery:  Drink ONE (1) Pre-Surgery G2 at   05:30 AM the morning of surgery. Drink in one sitting. Do not sip.  This drink was given to you during your hospital pre-op appointment visit. Nothing else to drink after completing  the Pre-Surgery Clear Ensure or G2 : No candy, chewing gum or throat lozenges.    FOLLOW ANY ADDITIONAL PRE OP INSTRUCTIONS YOU RECEIVED FROM YOUR SURGEON'S OFFICE!!!   Oral Hygiene is also important to reduce your risk of infection.        Remember - BRUSH YOUR TEETH THE MORNING OF SURGERY WITH YOUR REGULAR TOOTHPASTE  Do NOT smoke after Midnight the night before surgery.  STOP TAKING all Vitamins, Herbs and supplements 1 week before your surgery.   METFORMIN -  Day BEFORE surgery; take as usual.  DO NOT TAKE METFORMIN  THE MORNING OF SURGERY  JARDIANCE - STOP TAKING 72 hours before surgery.  Last dose will be taking on Saturday 09-01-23.  DO NOT TAKE LISINOPRIL  or HCTZ  THE MORNING OF YOUR SURGERY.                                                                        Take ONLY these medicines the morning of surgery with A SIP OF WATER: amlodipine .  If You have been diagnosed with Sleep Apnea - Bring CPAP mask and tubing day of surgery. We will provide you with a CPAP machine on the day of your surgery.                   You may not have any metal on your body including jewelry, and body piercing  Do not wear lotions, powders,cologne, or deodorant  Men may shave face and neck.  Contacts, Hearing Aids, dentures or bridgework may not be worn into surgery. DENTURES WILL BE REMOVED PRIOR TO SURGERY PLEASE DO NOT APPLY "Poly grip" OR ADHESIVES!!!  You may bring a small  overnight bag with you on the day of surgery, only pack items that are not valuable. [facility] IS NOT RESPONSIBLE   FOR VALUABLES THAT ARE LOST OR STOLEN.   Do not bring your home medications to the hospital. The Pharmacy will dispense medications listed on your medication list to you during your admission in the Hospital.  Special Instructions: Bring a copy of your healthcare power of attorney and living will documents the day of surgery, if you wish to have them scanned into your [facility] Records- EPIC  Please read over the following fact sheets you were given: IF YOU HAVE QUESTIONS ABOUT YOUR PRE-OP INSTRUCTIONS, PLEASE CALL [phone].     Pre-operative 5 CHG Bath Instructions   You can play a key role in reducing the risk of infection after surgery. Your skin needs to be as free of germs as possible. You can reduce the number of germs on your skin by washing with CHG (chlorhexidine  gluconate) soap before surgery. CHG is an antiseptic soap that kills germs and continues to kill germs even after washing.   DO NOT use if you have an allergy to chlorhexidine /CHG or antibacterial soaps. If your skin becomes reddened or irritated, stop using the CHG and notify one of our RNs at [phone]  Please shower with the CHG soap starting 4 days before surgery using the following schedule: START SHOWERS ON SATURDAY  Sep 01, 2023  Please keep in mind the following:  DO NOT shave, including legs and underarms, starting the day of your first shower.   You may shave your face at any point before/day of surgery.   Place clean sheets on your bed the day you start using CHG soap. Use a clean washcloth (not used since being washed) for each shower. DO NOT sleep with pets once you start using the CHG.   CHG Shower  Instructions:  If you choose to wash your hair and private area, wash first with your normal shampoo/soap.  After you use shampoo/soap, rinse your hair and body thoroughly to remove shampoo/soap residue.  Turn the water OFF and apply about 3 tablespoons (45 ml) of CHG soap to a CLEAN washcloth.  Apply CHG soap ONLY FROM YOUR NECK DOWN TO YOUR TOES (washing for 3-5 minutes)  DO NOT use CHG soap on face, private areas, open wounds, or sores.  Pay special attention to the area where your surgery is being performed.  If you are having back surgery, having someone wash your back for you may be helpful.  Wait 2 minutes after CHG soap is applied, then you may rinse off the CHG soap.  Pat dry with a clean towel  Put on clean clothes/pajamas   If you choose to wear lotion, please use ONLY the CHG-compatible lotions on the back of this paper.     Additional instructions for the day of surgery: DO NOT APPLY any lotions, deodorants, cologne, or perfumes.   Put on clean/comfortable clothes.  Brush your teeth.  Ask your nurse before applying any prescription medications to the skin.      CHG Compatible Lotions   Aveeno Moisturizing lotion  Cetaphil Moisturizing Cream  Cetaphil Moisturizing Lotion  Clairol Herbal Essence Moisturizing Lotion, Dry Skin  Clairol Herbal Essence Moisturizing Lotion, Extra Dry Skin  Clairol Herbal Essence Moisturizing Lotion, Normal Skin  Curel Age Defying Therapeutic Moisturizing Lotion with Alpha Hydroxy  Curel Extreme Care Body Lotion  Curel Soothing Hands Moisturizing Hand Lotion  Curel Therapeutic Moisturizing Cream, Fragrance-Free  Curel Therapeutic Moisturizing Lotion, Fragrance-Free  Curel Therapeutic Moisturizing Lotion, Original Formula  Eucerin Daily Replenishing Lotion  Eucerin Dry Skin Therapy Plus Alpha Hydroxy Crme  Eucerin Dry Skin Therapy Plus Alpha Hydroxy Lotion  Eucerin Original Crme  Eucerin Original Lotion  Eucerin Plus Crme Eucerin  Plus Lotion  Eucerin TriLipid Replenishing Lotion  Keri Anti-Bacterial Hand Lotion  Keri Deep Conditioning Original Lotion Dry Skin Formula Softly Scented  Keri Deep Conditioning Original Lotion, Fragrance Free Sensitive Skin Formula  Keri Lotion Fast Absorbing Fragrance Free Sensitive Skin Formula  Keri Lotion Fast Absorbing Softly Scented Dry Skin Formula  Keri Original Lotion  Keri Skin Renewal Lotion Keri Silky Smooth Lotion  Keri Silky Smooth Sensitive Skin Lotion  Nivea Body Creamy Conditioning Oil  Nivea Body Extra Enriched Lotion  Nivea Body Original Lotion  Nivea Body Sheer Moisturizing Lotion Nivea Crme  Nivea Skin Firming Lotion  NutraDerm 30 Skin Lotion  NutraDerm Skin Lotion  NutraDerm Therapeutic Skin Cream  NutraDerm Therapeutic Skin Lotion  ProShield Protective Hand Cream  Provon moisturizing lotion   FAILURE TO FOLLOW THESE INSTRUCTIONS MAY RESULT IN THE CANCELLATION OF YOUR SURGERY  PATIENT SIGNATURE_________________________________  NURSE SIGNATURE__________________________________  ________________________________________________________________________       Michael [name]    An incentive spirometer is a tool that can help keep your lungs clear and active. This tool measures how well you are filling your lungs with each breath. Taking  long deep breaths may help reverse or decrease the chance of developing breathing (pulmonary) problems (especially infection) following: A long period of time when you are unable to move or be active. BEFORE THE PROCEDURE  If the spirometer includes an indicator to show your best effort, your nurse or respiratory therapist will set it to a desired goal. If possible, sit up straight or lean slightly forward. Try not to slouch. Hold the incentive spirometer in an upright position. INSTRUCTIONS FOR USE  Sit on the edge of your bed if possible, or sit up as far as you can in bed or on a chair. Hold the incentive  spirometer in an upright position. Breathe out normally. Place the mouthpiece in your mouth and seal your lips tightly around it. Breathe in slowly and as deeply as possible, raising the piston or the ball toward the top of the column. Hold your breath for 3-5 seconds or for as long as possible. Allow the piston or ball to fall to the bottom of the column. Remove the mouthpiece from your mouth and breathe out normally. Rest for a few seconds and repeat Steps 1 through 7 at least 10 times every 1-2 hours when you are awake. Take your time and take a few normal breaths between deep breaths. The spirometer may include an indicator to show your best effort. Use the indicator as a goal to work toward during each repetition. After each set of 10 deep breaths, practice coughing to be sure your lungs are clear. If you have an incision (the cut made at the time of surgery), support your incision when coughing by placing a pillow or rolled up towels firmly against it. Once you are able to get out of bed, walk around indoors and cough well. You may stop using the incentive spirometer when instructed by your caregiver.  RISKS AND COMPLICATIONS Take your time so you do not get dizzy or light-headed. If you are in pain, you may need to take or ask for pain medication before doing incentive spirometry. It is harder to take a deep breath if you are having pain. AFTER USE Rest and breathe slowly and easily. It can be helpful to keep track of a log of your progress. Your caregiver can provide you with a simple table to help with this. If you are using the spirometer at home, follow these instructions: SEEK MEDICAL CARE IF:  You are having difficultly using the spirometer. You have trouble using the spirometer as often as instructed. Your pain medication is not giving enough relief while using the spirometer. You develop fever of 100.5 F (38.1 C) or higher.                                                                                                     SEEK IMMEDIATE MEDICAL CARE IF:  You cough up bloody sputum that had not been present before. You develop fever of 102 F (38.9 C) or greater. You develop worsening pain at or near the incision site. MAKE SURE YOU:  Understand these instructions. Will watch your condition.  Will get help right away if you are not doing well or get worse. Document Released: 07/31/2006 Document Revised: 06/12/2011 Document Reviewed: 10/01/2006 [facility] Patient Information 2014 [place], [place].         If you would like to see a video about joint replacement:   [email or web address]

## 2023-08-29 ENCOUNTER — Other Ambulatory Visit: Payer: Self-pay

## 2023-08-29 ENCOUNTER — Encounter (HOSPITAL_COMMUNITY)
Admission: RE | Admit: 2023-08-29 | Discharge: 2023-08-29 | Disposition: A | Discharge status: Home / Self Care | Source: Ambulatory Visit | Attending: Orthopedic Surgery | Admitting: Orthopedic Surgery

## 2023-08-29 ENCOUNTER — Encounter (HOSPITAL_COMMUNITY): Payer: Self-pay

## 2023-08-29 VITALS — BP 132/71 | HR 68 | Temp 98.6°F | Resp 20 | Ht 75.0 in | Wt 312.0 lb

## 2023-08-29 DIAGNOSIS — M170 Bilateral primary osteoarthritis of knee: Secondary | ICD-10-CM | POA: Diagnosis not present

## 2023-08-29 DIAGNOSIS — Z01812 Encounter for preprocedural laboratory examination: Secondary | ICD-10-CM | POA: Diagnosis not present

## 2023-08-29 DIAGNOSIS — E0843 Diabetes mellitus due to underlying condition with diabetic autonomic (poly)neuropathy: Secondary | ICD-10-CM | POA: Insufficient documentation

## 2023-08-29 DIAGNOSIS — M25561 Pain in right knee: Secondary | ICD-10-CM | POA: Insufficient documentation

## 2023-08-29 DIAGNOSIS — G8929 Other chronic pain: Secondary | ICD-10-CM | POA: Insufficient documentation

## 2023-08-29 DIAGNOSIS — Z01818 Encounter for other preprocedural examination: Secondary | ICD-10-CM

## 2023-08-29 HISTORY — DX: Sleep apnea, unspecified: G47.30

## 2023-08-29 LAB — CBC WITH DIFFERENTIAL/PLATELET
Abs Immature Granulocytes: 0.01 10*3/uL (ref 0.00–0.07)
Basophils Absolute: 0 10*3/uL (ref 0.0–0.1)
Basophils Relative: 1 %
Eosinophils Absolute: 0.2 10*3/uL (ref 0.0–0.5)
Eosinophils Relative: 3 %
HCT: 40.5 % (ref 39.0–52.0)
Hemoglobin: 11.8 g/dL — ABNORMAL LOW (ref 13.0–17.0)
Immature Granulocytes: 0 %
Lymphocytes Relative: 46 %
Lymphs Abs: 3.6 10*3/uL (ref 0.7–4.0)
MCH: 23.1 pg — ABNORMAL LOW (ref 26.0–34.0)
MCHC: 29.1 g/dL — ABNORMAL LOW (ref 30.0–36.0)
MCV: 79.4 fL — ABNORMAL LOW (ref 80.0–100.0)
Monocytes Absolute: 0.4 10*3/uL (ref 0.1–1.0)
Monocytes Relative: 5 %
Neutro Abs: 3.5 10*3/uL (ref 1.7–7.7)
Neutrophils Relative %: 45 %
Platelets: 223 10*3/uL (ref 150–400)
RBC: 5.1 MIL/uL (ref 4.22–5.81)
RDW: 15.6 % — ABNORMAL HIGH (ref 11.5–15.5)
WBC: 7.7 10*3/uL (ref 4.0–10.5)
nRBC: 0 % (ref 0.0–0.2)

## 2023-08-29 LAB — COMPREHENSIVE METABOLIC PANEL WITH GFR
ALT: 10 U/L (ref 0–44)
AST: 17 U/L (ref 15–41)
Albumin: 3.7 g/dL (ref 3.5–5.0)
Alkaline Phosphatase: 94 U/L (ref 38–126)
Anion gap: 10 (ref 5–15)
BUN: 16 mg/dL (ref 8–23)
CO2: 26 mmol/L (ref 22–32)
Calcium: 9.3 mg/dL (ref 8.9–10.3)
Chloride: 103 mmol/L (ref 98–111)
Creatinine, Ser: 1.02 mg/dL (ref 0.61–1.24)
GFR, Estimated: >60 mL/min (ref >60)
Glucose, Bld: 144 mg/dL — ABNORMAL HIGH (ref 70–99)
Potassium: 3.7 mmol/L (ref 3.5–5.1)
Sodium: 139 mmol/L (ref 135–145)
Total Bilirubin: 0.6 mg/dL (ref 0.0–1.2)
Total Protein: 7.3 g/dL (ref 6.5–8.1)

## 2023-08-29 LAB — SURGICAL PCR SCREEN
MRSA, PCR: NEGATIVE
Staphylococcus aureus: NEGATIVE

## 2023-08-29 LAB — GLUCOSE, CAPILLARY: Glucose-Capillary: 155 mg/dL — ABNORMAL HIGH (ref 70–99)

## 2023-08-31 ENCOUNTER — Other Ambulatory Visit: Payer: Self-pay | Admitting: Nurse Practitioner

## 2023-08-31 DIAGNOSIS — E0843 Diabetes mellitus due to underlying condition with diabetic autonomic (poly)neuropathy: Secondary | ICD-10-CM

## 2023-08-31 DIAGNOSIS — I10 Essential (primary) hypertension: Secondary | ICD-10-CM

## 2023-09-04 NOTE — Anesthesia Preprocedure Evaluation (Signed)
 Anesthesia Evaluation  Patient identified by MRN, date of birth, ID band Patient awake    Reviewed: Allergy & Precautions, NPO status , Patient's Chart, lab work & pertinent test results  Airway Mallampati: II  TM Distance: >3 FB Neck ROM: Full    Dental no notable dental hx. (+) Teeth Intact, Dental Advisory Given   Pulmonary sleep apnea , former smoker   Pulmonary exam normal breath sounds clear to auscultation       Cardiovascular hypertension, Pt. on medications Normal cardiovascular exam Rhythm:Regular Rate:Normal     Neuro/Psych Seizures: epilipsy.     GI/Hepatic negative GI ROS, Neg liver ROS,,,  Endo/Other  diabetes, Well Controlled, Type 2, Oral Hypoglycemic Agents    Renal/GU Lab Results      Component                Value               Date                          K                        3.7                 08/29/2023                CO2                      26                  08/29/2023                BUN                      16                  08/29/2023                CREATININE               1.02                08/29/2023                GFRNONAA                 >60                 08/29/2023                CALCIUM                   9.3                 08/29/2023                ALBUMIN                  3.7                 08/29/2023                GLUCOSE                  144 (H)             08/29/2023  Musculoskeletal  (+) Arthritis , Osteoarthritis,    Abdominal   Peds  Hematology Lab Results      Component                Value               Date                      WBC                      7.7                 08/29/2023                HGB                      11.8 (L)            08/29/2023                HCT                      40.5                08/29/2023                MCV                      79.4 (L)            08/29/2023                PLT                      223                  08/29/2023              Anesthesia Other Findings   Reproductive/Obstetrics                             Anesthesia Physical Anesthesia Plan  ASA: 3  Anesthesia Plan: Spinal and Regional   Post-op Pain Management: Regional block* and Ofirmev  IV (intra-op)*   Induction:   PONV Risk Score and Plan: 1 and Propofol  infusion, Treatment may vary due to age or medical condition, Midazolam and Ondansetron   Airway Management Planned: Nasal Cannula and Natural Airway  Additional Equipment: None  Intra-op Plan:   Post-operative Plan:   Informed Consent: I have reviewed the patients History and Physical, chart, labs and discussed the procedure including the risks, benefits and alternatives for the proposed anesthesia with the patient or authorized representative who has indicated his/her understanding and acceptance.     Dental advisory given  Plan Discussed with: CRNA and Surgeon  Anesthesia Plan Comments: (Spinal w R adductor)        Anesthesia Quick Evaluation

## 2023-09-05 ENCOUNTER — Encounter (HOSPITAL_COMMUNITY): Payer: Self-pay | Admitting: Orthopedic Surgery

## 2023-09-05 ENCOUNTER — Observation Stay (HOSPITAL_COMMUNITY)
Admission: RE | Admit: 2023-09-05 | Discharge: 2023-09-06 | Disposition: A | Discharge status: Home / Self Care | Attending: Orthopedic Surgery | Admitting: Orthopedic Surgery

## 2023-09-05 ENCOUNTER — Other Ambulatory Visit: Payer: Self-pay

## 2023-09-05 ENCOUNTER — Encounter (HOSPITAL_COMMUNITY)
Admission: RE | Disposition: A | Discharge status: Home / Self Care | Payer: Self-pay | Source: Home / Self Care | Attending: Orthopedic Surgery

## 2023-09-05 ENCOUNTER — Ambulatory Visit (HOSPITAL_BASED_OUTPATIENT_CLINIC_OR_DEPARTMENT_OTHER): Payer: Self-pay | Admitting: Anesthesiology

## 2023-09-05 ENCOUNTER — Ambulatory Visit (HOSPITAL_COMMUNITY)

## 2023-09-05 ENCOUNTER — Ambulatory Visit (HOSPITAL_COMMUNITY): Payer: Self-pay | Admitting: Anesthesiology

## 2023-09-05 DIAGNOSIS — G4733 Obstructive sleep apnea (adult) (pediatric): Secondary | ICD-10-CM | POA: Diagnosis not present

## 2023-09-05 DIAGNOSIS — M1711 Unilateral primary osteoarthritis, right knee: Principal | ICD-10-CM | POA: Insufficient documentation

## 2023-09-05 DIAGNOSIS — Z96651 Presence of right artificial knee joint: Secondary | ICD-10-CM | POA: Diagnosis not present

## 2023-09-05 DIAGNOSIS — Z87891 Personal history of nicotine dependence: Secondary | ICD-10-CM | POA: Insufficient documentation

## 2023-09-05 DIAGNOSIS — Z96652 Presence of left artificial knee joint: Secondary | ICD-10-CM | POA: Insufficient documentation

## 2023-09-05 DIAGNOSIS — E0843 Diabetes mellitus due to underlying condition with diabetic autonomic (poly)neuropathy: Secondary | ICD-10-CM

## 2023-09-05 DIAGNOSIS — Z79899 Other long term (current) drug therapy: Secondary | ICD-10-CM | POA: Diagnosis not present

## 2023-09-05 DIAGNOSIS — I10 Essential (primary) hypertension: Secondary | ICD-10-CM | POA: Insufficient documentation

## 2023-09-05 DIAGNOSIS — R609 Edema, unspecified: Secondary | ICD-10-CM | POA: Diagnosis not present

## 2023-09-05 DIAGNOSIS — E119 Type 2 diabetes mellitus without complications: Secondary | ICD-10-CM

## 2023-09-05 DIAGNOSIS — Z7984 Long term (current) use of oral hypoglycemic drugs: Secondary | ICD-10-CM | POA: Diagnosis not present

## 2023-09-05 DIAGNOSIS — Z01818 Encounter for other preprocedural examination: Secondary | ICD-10-CM

## 2023-09-05 DIAGNOSIS — G8918 Other acute postprocedural pain: Secondary | ICD-10-CM | POA: Diagnosis not present

## 2023-09-05 HISTORY — PX: TOTAL KNEE ARTHROPLASTY: SHX125

## 2023-09-05 LAB — TYPE AND SCREEN
ABO/RH(D): O POS
Antibody Screen: NEGATIVE

## 2023-09-05 LAB — GLUCOSE, CAPILLARY
Glucose-Capillary: 113 mg/dL — ABNORMAL HIGH (ref 70–99)
Glucose-Capillary: 127 mg/dL — ABNORMAL HIGH (ref 70–99)
Glucose-Capillary: 192 mg/dL — ABNORMAL HIGH (ref 70–99)
Glucose-Capillary: 181 mg/dL — ABNORMAL HIGH (ref 70–99)

## 2023-09-05 SURGERY — ARTHROPLASTY, KNEE, TOTAL
Anesthesia: Regional | Site: Knee | Laterality: Right

## 2023-09-05 MED ORDER — ONDANSETRON HCL 4 MG PO TABS: 4.0000 mg | ORAL_TABLET | Freq: Four times a day (QID) | ORAL | Status: DC | PRN

## 2023-09-05 MED ORDER — PHENOL 1.4 % MT LIQD
1.0000 | OROMUCOSAL | Status: DC | PRN
Start: 2023-09-05 — End: 2023-09-06

## 2023-09-05 MED ORDER — ISOPROPYL ALCOHOL 70 % SOLN
Status: DC | PRN
  Administered 2023-09-05: 1 via TOPICAL

## 2023-09-05 MED ORDER — DROPERIDOL 2.5 MG/ML IJ SOLN
0.6250 mg | Freq: Once | INTRAMUSCULAR | Status: DC | PRN
Start: 2023-09-05 — End: 2023-09-05

## 2023-09-05 MED ORDER — PROPOFOL 500 MG/50ML IV EMUL
INTRAVENOUS | Status: DC | PRN
  Administered 2023-09-05: 100 ug/kg/min via INTRAVENOUS

## 2023-09-05 MED ORDER — OXYCODONE HCL 5 MG PO TABS: 5.0000 mg | ORAL_TABLET | ORAL | 0 refills | Status: AC | PRN

## 2023-09-05 MED ORDER — BUPIVACAINE LIPOSOME 1.3 % IJ SUSP
INTRAMUSCULAR | Status: DC | PRN
  Administered 2023-09-05: 20 mL

## 2023-09-05 MED ORDER — OMEPRAZOLE 40 MG PO CPDR: 40.0000 mg | DELAYED_RELEASE_CAPSULE | Freq: Every day | ORAL | 0 refills | Status: DC

## 2023-09-05 MED ORDER — SODIUM CHLORIDE 0.9% FLUSH
INTRAVENOUS | Status: DC | PRN
  Administered 2023-09-05: 30 mL

## 2023-09-05 MED ORDER — METFORMIN HCL 500 MG PO TABS
1000.0000 mg | ORAL_TABLET | Freq: Two times a day (BID) | ORAL | Status: DC
  Administered 2023-09-05 – 2023-09-06 (×2): 1000 mg via ORAL
  Filled 2023-09-05 (×2): qty 2

## 2023-09-05 MED ORDER — LACTATED RINGERS IV SOLN: INTRAVENOUS | Status: DC

## 2023-09-05 MED ORDER — DOCUSATE SODIUM 100 MG PO CAPS
100.0000 mg | ORAL_CAPSULE | Freq: Two times a day (BID) | ORAL | Status: DC
  Administered 2023-09-05 – 2023-09-06 (×2): 100 mg via ORAL
  Filled 2023-09-05 (×2): qty 1

## 2023-09-05 MED ORDER — INSULIN ASPART 100 UNIT/ML IJ SOLN: 0.0000 [IU] | Freq: Three times a day (TID) | INTRAMUSCULAR | Status: DC

## 2023-09-05 MED ORDER — ORAL CARE MOUTH RINSE: 15.0000 mL | Freq: Once | OROMUCOSAL | Status: AC

## 2023-09-05 MED ORDER — KETOROLAC TROMETHAMINE 15 MG/ML IJ SOLN
7.5000 mg | Freq: Four times a day (QID) | INTRAMUSCULAR | Status: AC
  Administered 2023-09-05 – 2023-09-06 (×4): 7.5 mg via INTRAVENOUS
  Filled 2023-09-05 (×4): qty 1

## 2023-09-05 MED ORDER — DEXAMETHASONE SODIUM PHOSPHATE 10 MG/ML IJ SOLN: 4.0000 mg | Freq: Once | INTRAMUSCULAR | Status: DC

## 2023-09-05 MED ORDER — BUPIVACAINE IN DEXTROSE 0.75-8.25 % IT SOLN
INTRATHECAL | Status: DC | PRN
  Administered 2023-09-05: 2 mL via INTRATHECAL

## 2023-09-05 MED ORDER — ONDANSETRON HCL 4 MG PO TABS: 4.0000 mg | ORAL_TABLET | Freq: Three times a day (TID) | ORAL | 0 refills | Status: AC | PRN

## 2023-09-05 MED ORDER — ROPIVACAINE HCL 5 MG/ML IJ SOLN
INTRAMUSCULAR | Status: DC | PRN
  Administered 2023-09-05: 30 mL via PERINEURAL

## 2023-09-05 MED ORDER — WATER FOR IRRIGATION, STERILE IR SOLN
Status: DC | PRN
Start: 2023-09-05 — End: 2023-09-05
  Administered 2023-09-05: 2000 mL

## 2023-09-05 MED ORDER — 0.9 % SODIUM CHLORIDE (POUR BTL) OPTIME
TOPICAL | Status: DC | PRN
Start: 2023-09-05 — End: 2023-09-05
  Administered 2023-09-05: 1000 mL

## 2023-09-05 MED ORDER — BUPIVACAINE-EPINEPHRINE (PF) 0.25% -1:200000 IJ SOLN
INTRAMUSCULAR | Status: AC
  Filled 2023-09-05: qty 30

## 2023-09-05 MED ORDER — FENTANYL CITRATE (PF) 100 MCG/2ML IJ SOLN
INTRAMUSCULAR | Status: DC | PRN
  Administered 2023-09-05: 100 ug via INTRAVENOUS

## 2023-09-05 MED ORDER — MENTHOL 3 MG MT LOZG: 1.0000 | LOZENGE | OROMUCOSAL | Status: DC | PRN

## 2023-09-05 MED ORDER — ONDANSETRON HCL 4 MG/2ML IJ SOLN
INTRAMUSCULAR | Status: DC | PRN
  Administered 2023-09-05: 4 mg via INTRAVENOUS

## 2023-09-05 MED ORDER — OXYCODONE HCL 5 MG PO TABS: 5.0000 mg | ORAL_TABLET | Freq: Once | ORAL | Status: DC | PRN

## 2023-09-05 MED ORDER — OXYCODONE HCL 5 MG/5ML PO SOLN: 5.0000 mg | Freq: Once | ORAL | Status: DC | PRN

## 2023-09-05 MED ORDER — MIDAZOLAM HCL 5 MG/5ML IJ SOLN
INTRAMUSCULAR | Status: DC | PRN
Start: 2023-09-05 — End: 2023-09-05
  Administered 2023-09-05: 2 mg via INTRAVENOUS

## 2023-09-05 MED ORDER — ZOLPIDEM TARTRATE 5 MG PO TABS
5.0000 mg | ORAL_TABLET | Freq: Every evening | ORAL | Status: DC | PRN
Start: 2023-09-05 — End: 2023-09-06

## 2023-09-05 MED ORDER — PROPOFOL 10 MG/ML IV BOLUS
INTRAVENOUS | Status: DC | PRN
  Administered 2023-09-05 (×2): 20 mg via INTRAVENOUS

## 2023-09-05 MED ORDER — ASPIRIN 81 MG PO TBEC: 81.0000 mg | DELAYED_RELEASE_TABLET | Freq: Two times a day (BID) | ORAL | Status: AC

## 2023-09-05 MED ORDER — PROPOFOL 1000 MG/100ML IV EMUL
INTRAVENOUS | Status: AC
  Filled 2023-09-05: qty 100

## 2023-09-05 MED ORDER — ACETAMINOPHEN 10 MG/ML IV SOLN
1000.0000 mg | Freq: Once | INTRAVENOUS | Status: DC | PRN
Start: 2023-09-05 — End: 2023-09-05

## 2023-09-05 MED ORDER — ACETAMINOPHEN 325 MG PO TABS: 325.0000 mg | ORAL_TABLET | Freq: Four times a day (QID) | ORAL | Status: DC | PRN

## 2023-09-05 MED ORDER — HYDROCHLOROTHIAZIDE 25 MG PO TABS
25.0000 mg | ORAL_TABLET | Freq: Every day | ORAL | Status: DC
  Administered 2023-09-06: 25 mg via ORAL
  Filled 2023-09-05: qty 1

## 2023-09-05 MED ORDER — EMPAGLIFLOZIN 10 MG PO TABS
10.0000 mg | ORAL_TABLET | Freq: Every day | ORAL | Status: DC
  Administered 2023-09-06: 10 mg via ORAL
  Filled 2023-09-05: qty 1

## 2023-09-05 MED ORDER — HYDROMORPHONE HCL 1 MG/ML IJ SOLN: 0.2500 mg | INTRAMUSCULAR | Status: DC | PRN

## 2023-09-05 MED ORDER — ONDANSETRON HCL 4 MG/2ML IJ SOLN: 4.0000 mg | Freq: Four times a day (QID) | INTRAMUSCULAR | Status: DC | PRN

## 2023-09-05 MED ORDER — PHENYLEPHRINE HCL-NACL 20-0.9 MG/250ML-% IV SOLN
INTRAVENOUS | Status: DC | PRN
Start: 2023-09-05 — End: 2023-09-05
  Administered 2023-09-05: 50 ug/min via INTRAVENOUS

## 2023-09-05 MED ORDER — LISINOPRIL 10 MG PO TABS
10.0000 mg | ORAL_TABLET | Freq: Every day | ORAL | Status: DC
  Administered 2023-09-06: 10 mg via ORAL
  Filled 2023-09-05: qty 1

## 2023-09-05 MED ORDER — PANTOPRAZOLE SODIUM 40 MG PO TBEC
40.0000 mg | DELAYED_RELEASE_TABLET | Freq: Every day | ORAL | Status: DC
  Administered 2023-09-05 – 2023-09-06 (×2): 40 mg via ORAL
  Filled 2023-09-05 (×2): qty 1

## 2023-09-05 MED ORDER — ACETAMINOPHEN 500 MG PO TABS
1000.0000 mg | ORAL_TABLET | Freq: Four times a day (QID) | ORAL | Status: AC
  Administered 2023-09-05 – 2023-09-06 (×4): 1000 mg via ORAL
  Filled 2023-09-05 (×4): qty 2

## 2023-09-05 MED ORDER — DEXAMETHASONE SODIUM PHOSPHATE 10 MG/ML IJ SOLN
INTRAMUSCULAR | Status: AC
  Filled 2023-09-05: qty 1

## 2023-09-05 MED ORDER — DEXAMETHASONE SODIUM PHOSPHATE 10 MG/ML IJ SOLN
INTRAMUSCULAR | Status: DC | PRN
  Administered 2023-09-05: 10 mg via INTRAVENOUS

## 2023-09-05 MED ORDER — ONDANSETRON HCL 4 MG/2ML IJ SOLN: 4.0000 mg | Freq: Once | INTRAMUSCULAR | Status: DC | PRN

## 2023-09-05 MED ORDER — CLONIDINE HCL (ANALGESIA) 100 MCG/ML EP SOLN
EPIDURAL | Status: DC | PRN
  Administered 2023-09-05: 100 ug

## 2023-09-05 MED ORDER — CHLORHEXIDINE GLUCONATE 0.12 % MT SOLN
15.0000 mL | Freq: Once | OROMUCOSAL | Status: AC
  Administered 2023-09-05: 15 mL via OROMUCOSAL

## 2023-09-05 MED ORDER — TRANEXAMIC ACID-NACL 1000-0.7 MG/100ML-% IV SOLN
1000.0000 mg | INTRAVENOUS | Status: AC
  Administered 2023-09-05: 1000 mg via INTRAVENOUS
  Filled 2023-09-05: qty 100

## 2023-09-05 MED ORDER — METHOCARBAMOL 500 MG PO TABS
500.0000 mg | ORAL_TABLET | Freq: Four times a day (QID) | ORAL | Status: DC | PRN
Start: 2023-09-05 — End: 2023-09-06

## 2023-09-05 MED ORDER — HYDROMORPHONE HCL 1 MG/ML IJ SOLN: 0.5000 mg | INTRAMUSCULAR | Status: DC | PRN

## 2023-09-05 MED ORDER — DIPHENHYDRAMINE HCL 12.5 MG/5ML PO ELIX: 12.5000 mg | ORAL_SOLUTION | ORAL | Status: DC | PRN

## 2023-09-05 MED ORDER — SODIUM CHLORIDE 0.9 % IR SOLN
Status: DC | PRN
  Administered 2023-09-05: 3000 mL

## 2023-09-05 MED ORDER — CELECOXIB 100 MG PO CAPS: 100.0000 mg | ORAL_CAPSULE | Freq: Two times a day (BID) | ORAL | 0 refills | Status: AC

## 2023-09-05 MED ORDER — METHOCARBAMOL 1000 MG/10ML IJ SOLN: 500.0000 mg | Freq: Four times a day (QID) | INTRAMUSCULAR | Status: DC | PRN

## 2023-09-05 MED ORDER — SODIUM CHLORIDE 0.9 % IV SOLN: INTRAVENOUS | Status: DC

## 2023-09-05 MED ORDER — POLYETHYLENE GLYCOL 3350 17 G PO PACK: 17.0000 g | PACK | Freq: Every day | ORAL | Status: DC | PRN

## 2023-09-05 MED ORDER — BUPIVACAINE LIPOSOME 1.3 % IJ SUSP: 20.0000 mL | Freq: Once | INTRAMUSCULAR | Status: DC

## 2023-09-05 MED ORDER — CEFAZOLIN SODIUM-DEXTROSE 3-4 GM/150ML-% IV SOLN
3.0000 g | INTRAVENOUS | Status: AC
  Administered 2023-09-05: 3 g via INTRAVENOUS
  Filled 2023-09-05: qty 150

## 2023-09-05 MED ORDER — METHOCARBAMOL 500 MG PO TABS: 500.0000 mg | ORAL_TABLET | Freq: Three times a day (TID) | ORAL | 0 refills | Status: AC | PRN

## 2023-09-05 MED ORDER — ACETAMINOPHEN 500 MG PO TABS
1000.0000 mg | ORAL_TABLET | Freq: Once | ORAL | Status: AC
  Administered 2023-09-05: 1000 mg via ORAL
  Filled 2023-09-05: qty 2

## 2023-09-05 MED ORDER — BUPIVACAINE LIPOSOME 1.3 % IJ SUSP
INTRAMUSCULAR | Status: AC
  Filled 2023-09-05: qty 20

## 2023-09-05 MED ORDER — SODIUM CHLORIDE (PF) 0.9 % IJ SOLN
INTRAMUSCULAR | Status: AC
  Filled 2023-09-05: qty 50

## 2023-09-05 MED ORDER — MIDAZOLAM HCL 2 MG/2ML IJ SOLN
INTRAMUSCULAR | Status: AC
  Filled 2023-09-05: qty 2

## 2023-09-05 MED ORDER — BUPIVACAINE-EPINEPHRINE 0.25% -1:200000 IJ SOLN
INTRAMUSCULAR | Status: DC | PRN
  Administered 2023-09-05: 30 mL

## 2023-09-05 MED ORDER — INSULIN ASPART 100 UNIT/ML IJ SOLN: 0.0000 [IU] | INTRAMUSCULAR | Status: DC | PRN

## 2023-09-05 MED ORDER — POVIDONE-IODINE 10 % EX SWAB: 2.0000 | Freq: Once | CUTANEOUS | Status: DC

## 2023-09-05 MED ORDER — OXYCODONE HCL 5 MG PO TABS
5.0000 mg | ORAL_TABLET | ORAL | Status: DC | PRN
  Administered 2023-09-06: 5 mg via ORAL
  Filled 2023-09-05: qty 1

## 2023-09-05 MED ORDER — AMLODIPINE BESYLATE 10 MG PO TABS
10.0000 mg | ORAL_TABLET | Freq: Every day | ORAL | Status: DC
  Administered 2023-09-06: 10 mg via ORAL
  Filled 2023-09-05: qty 1

## 2023-09-05 MED ORDER — ATORVASTATIN CALCIUM 20 MG PO TABS
20.0000 mg | ORAL_TABLET | Freq: Every day | ORAL | Status: DC
  Administered 2023-09-06: 20 mg via ORAL
  Filled 2023-09-05: qty 1

## 2023-09-05 MED ORDER — CEFAZOLIN SODIUM-DEXTROSE 2-4 GM/100ML-% IV SOLN
2.0000 g | Freq: Four times a day (QID) | INTRAVENOUS | Status: AC
  Administered 2023-09-05 (×2): 2 g via INTRAVENOUS
  Filled 2023-09-05 (×2): qty 100

## 2023-09-05 MED ORDER — POLYETHYLENE GLYCOL 3350 17 G PO PACK: 17.0000 g | PACK | Freq: Every day | ORAL | 0 refills | Status: DC

## 2023-09-05 MED ORDER — ONDANSETRON HCL 4 MG/2ML IJ SOLN
INTRAMUSCULAR | Status: AC
  Filled 2023-09-05: qty 2

## 2023-09-05 MED ORDER — ACETAMINOPHEN 500 MG PO TABS: 1000.0000 mg | ORAL_TABLET | Freq: Three times a day (TID) | ORAL | Status: AC | PRN

## 2023-09-05 MED ORDER — FENTANYL CITRATE (PF) 100 MCG/2ML IJ SOLN
INTRAMUSCULAR | Status: AC
  Filled 2023-09-05: qty 2

## 2023-09-05 MED ORDER — ASPIRIN 81 MG PO CHEW
81.0000 mg | CHEWABLE_TABLET | Freq: Two times a day (BID) | ORAL | Status: DC
  Administered 2023-09-05 – 2023-09-06 (×2): 81 mg via ORAL
  Filled 2023-09-05 (×2): qty 1

## 2023-09-05 SURGICAL SUPPLY — 52 items
BAG COUNTER SPONGE SURGICOUNT (BAG) IMPLANT
BLADE SAG 18X100X1.27 (BLADE) ×2 IMPLANT
BLADE SAW SAG 35X64 .89 (BLADE) ×2 IMPLANT
BLADE SAW SGTL 11.0X1.19X90.0M (BLADE) IMPLANT
BNDG COHESIVE 3X5 TAN ST LF (GAUZE/BANDAGES/DRESSINGS) ×2 IMPLANT
BNDG ELASTIC 6X10 VLCR STRL LF (GAUZE/BANDAGES/DRESSINGS) ×2 IMPLANT
BOWL SMART MIX CTS (DISPOSABLE) IMPLANT
CEMENT BONE R 1X40 (Cement) IMPLANT
CEMENT BONE REFOBACIN R1X40 US (Cement) IMPLANT
CHLORAPREP W/TINT 26 (MISCELLANEOUS) ×4 IMPLANT
COMPONENT FEM STD PS KN CR12RT (Joint) IMPLANT
COMPONENT PATELLA 3 PEG 38 (Joint) IMPLANT
COMPONENT TIB KNEE PS G 0 RT (Joint) IMPLANT
COVER SURGICAL LIGHT HANDLE (MISCELLANEOUS) ×2 IMPLANT
CUFF TRNQT CYL 34X4.125X (TOURNIQUET CUFF) ×2 IMPLANT
DERMABOND ADVANCED .7 DNX12 (GAUZE/BANDAGES/DRESSINGS) ×2 IMPLANT
DRAPE INCISE IOBAN 85X60 (DRAPES) ×2 IMPLANT
DRAPE SHEET LG 3/4 BI-LAMINATE (DRAPES) ×2 IMPLANT
DRAPE U-SHAPE 47X51 STRL (DRAPES) ×2 IMPLANT
DRSG AQUACEL AG ADV 3.5X10 (GAUZE/BANDAGES/DRESSINGS) ×2 IMPLANT
ELECT REM PT RETURN 15FT ADLT (MISCELLANEOUS) ×2 IMPLANT
GAUZE SPONGE 4X4 12PLY STRL (GAUZE/BANDAGES/DRESSINGS) ×2 IMPLANT
GLOVE BIO SURGEON STRL SZ 6.5 (GLOVE) ×4 IMPLANT
GLOVE BIOGEL PI IND STRL 6.5 (GLOVE) ×2 IMPLANT
GLOVE BIOGEL PI IND STRL 8 (GLOVE) ×2 IMPLANT
GLOVE SURG ORTHO 8.0 STRL STRW (GLOVE) ×4 IMPLANT
GOWN STRL REUS W/ TWL XL LVL3 (GOWN DISPOSABLE) ×4 IMPLANT
HOLDER FOLEY CATH W/STRAP (MISCELLANEOUS) ×2 IMPLANT
HOOD PEEL AWAY T7 (MISCELLANEOUS) ×6 IMPLANT
KIT TURNOVER KIT A (KITS) ×2 IMPLANT
KNEE SYSTEM MC 10 RT (Knees) IMPLANT
MANIFOLD NEPTUNE II (INSTRUMENTS) ×2 IMPLANT
MARKER SKIN DUAL TIP RULER LAB (MISCELLANEOUS) ×2 IMPLANT
NS IRRIG 1000ML POUR BTL (IV SOLUTION) ×2 IMPLANT
PACK TOTAL KNEE CUSTOM (KITS) ×2 IMPLANT
PENCIL SMOKE EVACUATOR (MISCELLANEOUS) ×2 IMPLANT
PIN DRILL HDLS TROCAR 75 4PK (PIN) IMPLANT
SCREW HEADED 33MM KNEE (MISCELLANEOUS) IMPLANT
SET HNDPC FAN SPRY TIP SCT (DISPOSABLE) ×2 IMPLANT
SOLUTION IRRIG SURGIPHOR (IV SOLUTION) IMPLANT
SOLUTION PRONTOSAN WOUND 350ML (IRRIGATION / IRRIGATOR) IMPLANT
STRIP CLOSURE SKIN 1/2X4 (GAUZE/BANDAGES/DRESSINGS) ×2 IMPLANT
SUT MNCRL AB 3-0 PS2 18 (SUTURE) ×2 IMPLANT
SUT STRATAFIX 14 PDO 48 VLT (SUTURE) ×2 IMPLANT
SUT VIC AB 2-0 CT2 27 (SUTURE) ×4 IMPLANT
SUTURE STRATFX 0 PDS 27 VIOLET (SUTURE) ×2 IMPLANT
SYR 50ML LL SCALE MARK (SYRINGE) ×2 IMPLANT
TAPE STRIPS DRAPE STRL (GAUZE/BANDAGES/DRESSINGS) IMPLANT
TRAY FOLEY MTR SLVR 14FR STAT (SET/KITS/TRAYS/PACK) IMPLANT
TUBE SUCTION HIGH CAP CLEAR NV (SUCTIONS) ×2 IMPLANT
UNDERPAD 30X36 HEAVY ABSORB (UNDERPADS AND DIAPERS) ×2 IMPLANT
WRAP KNEE MAXI GEL POST OP (GAUZE/BANDAGES/DRESSINGS) ×2 IMPLANT

## 2023-09-05 NOTE — Plan of Care (Signed)
  Problem: Nutrition: Goal: Adequate nutrition will be maintained Outcome: Progressing   Problem: Coping: Goal: Level of anxiety will decrease Outcome: Progressing   Problem: Pain Managment: Goal: General experience of comfort will improve and/or be controlled Outcome: Progressing   Problem: Safety: Goal: Ability to remain free from injury will improve Outcome: Progressing   Problem: Coping: Goal: Ability to adjust to condition or change in health will improve Outcome: Progressing

## 2023-09-05 NOTE — Op Note (Signed)
 DATE OF SURGERY:  09/05/2023 TIME: 10:49 AM  PATIENT NAME:  Michael [name].   AGE: 68 y.o.    PRE-OPERATIVE DIAGNOSIS: End-stage right knee osteoarthritis  POST-OPERATIVE DIAGNOSIS:  Same  PROCEDURE: Press-fit right total Knee Arthroplasty  SURGEON:  Adric Wrede A Leyna Vanderkolk, MD   ASSISTANT: Mason Sole, PA-C, present and scrubbed throughout the case, critical for assistance with exposure, retraction, instrumentation, and closure.   OPERATIVE IMPLANTS:  Press-fit Zimmer persona size 12 standard CR right femur, press-fit right size G tibial baseplate, a 10 mm MC poly insert, 38 mm 3 peg press-fit patella  Implant Name Type Inv. Item Serial No. Manufacturer Lot No. LRB No. Used Action  COMPONENT FEM STD PS KN [id number] - [id number] Joint COMPONENT FEM STD PS KN [id number]  [employer](ORTH,TRAU,BIO,SG) [id number] Right 1 Implanted  COMPONENT TIB KNEE PS G 0 RT - [id number] Joint COMPONENT TIB KNEE PS G 0 RT  [employer]ON(ORTH,TRAU,BIO,SG) [id number] Right 1 Implanted  COMPONENT PATELLA 3 PEG 38 - [id number] Joint COMPONENT PATELLA 3 PEG 38  ZIMMER [employer]ON(ORTH,TRAU,BIO,SG) [id number] Right 1 Implanted  KNEE SYSTEM MC 10 RT - [id number] Knees KNEE SYSTEM MC 10 RT  ZIMMER [employer]ON(ORTH,TRAU,BIO,SG) [id number] Right 1 Implanted      PREOPERATIVE INDICATIONS:  Michael [name]. is a 68 y.o. year old male with end stage bone on bone degenerative arthritis of the knee who failed conservative treatment, including injections, antiinflammatories, activity modification, and assistive devices, and had significant impairment of their activities of daily living, and elected for Total Knee Arthroplasty.   The risks, benefits, and alternatives were discussed at length including but not limited to the risks of infection, bleeding, nerve injury, stiffness, blood clots, the need for revision surgery, cardiopulmonary complications, among others, and they were willing to proceed.  OPERATIVE FINDINGS AND UNIQUE  ASPECTS OF THE CASE: Significant preoperative stiffness with preoperative range of motion 5 to 90 degrees  ESTIMATED BLOOD LOSS: 50cc  OPERATIVE DESCRIPTION:   Once adequate anesthesia was induced, preoperative antibiotics, 3 gm of ancef ,1 gm of Tranexamic Acid , and 8 mg of Decadron  administered, the patient was positioned supine with a right thigh tourniquet placed.  The right lower extremity was prepped and draped in sterile fashion.  A time-  out was performed identifying the patient, planned procedure, and the appropriate extremity.     The leg was  exsanguinated, tourniquet elevated to 250 mmHg.  A midline incision was  made followed by median parapatellar arthrotomy. Anterior horn of the medial meniscus was released and resected. A medial release was performed, the infrapatellar fat pad was resected with care taken to protect the patellar tendon. The suprapatellar fat was removed to exposed the distal anterior femur. The anterior horn of the lateral meniscus and ACL were released.    Following initial  exposure, I first started with the femur  The femoral  canal was opened with a drill, canal was suctioned to try to prevent fat emboli.  An  intramedullary rod was passed set at 5 degrees valgus, 10mm. The distal femur was resected.  Following this resection, the tibia was  subluxated anteriorly.  Using the extramedullary guide, 10mm of bone was resected off   the proximal lateral tibia.  We confirmed the gap would be  stable medially and laterally with a size 10mm spacer block as well as confirmed that the tibial cut was perpendicular in the coronal plane, checking with an alignment rod.    Once this was done, the posterior femoral referencing  femoral sizer was placed under to the posterior condyles with 3 degrees of external rotational which was parallel to the transepicondylar axis and perpendicular to [employer]. The femur was sized to be a size 12 in the anterior-  posterior dimension. The   anterior, posterior, and  chamfer cuts were made without difficulty nor   notching making certain that I was along the anterior cortex to help  with flexion gap stability. Next a laminar spreader was placed with the knee in flexion and the medial lateral menisci were resected.  5 cc of the Exparel  mixture was injected in the medial side of the back of the knee and 3 cc in the lateral side.  1/2 inch curved osteotome was used to resect posterior osteophyte that was then removed with a pituitary rongeur.       At this point, the tibia was sized to be a size G.  The size G tray was  then pinned in position.  Trial reduction was attempted but notably the flexion space was especially tight.  Felt we could recut the tibia to increase slope slightly.  The tibia was then recut with additional slope.  Trial reduction was now carried with a 12 femur, G tibia, a 10mm MC insert.  The knee had full extension and was stable to varus valgus stress in extension.  The knee was still slightly tight in flexion and the PCL was released.  Attention was next directed to the patella.  Precut  measurement was noted to be 28 mm.  I resected down to 18 mm and used a  38mm patellar button to restore patellar height as well as cover the cut surface.     The patella lug holes were drilled and a 38mm patella poly trial was placed.    The knee was brought to full extension with good flexion stability with the patella tracking through the trochlea without application of pressure.    Next the femoral component was again assessed and determined to be seated and appropriately lateralized.  The femoral lug holes were drilled.  The femoral component was then removed. Tibial component was again assessed and felt to be seated and appropriately rotated with the medial third of the tubercle. The tibia was then drilled, and keel punched.     Final components were  opened and impacted into place.   The knee was irrigated with sterile Betadine   diluted in saline as well as pulse lavage normal saline. The synovial lining was  then injected a dilute Exparel  with 30cc of 0.25% marcaine  with epinephrine .     I confirmed that I was satisfied with the range of motion and stability, and the final 10 mm MC poly insert was chosen.  It was placed into the knee.         The tourniquet had been let down at 89 minutes.  No significant hemostasis was required.  The medial parapatellar arthrotomy was then reapproximated using #1 Stratafix sutures with the knee  in flexion.  The remaining wound was closed with 0 stratafix, 2-0 Vicryl, and running 3-0 Monocryl. The knee was cleaned, dried, dressed sterilely using Dermabond and   Aquacel dressing.  The patient was then brought to recovery room in stable condition, tolerating the procedure  well. There were no complications.   Post op recs: WB: WBAT Abx: ancef  Imaging: PACU xrays DVT prophylaxis: Aspirin  81mg  BID x4 weeks Follow up: 2 weeks after surgery for a wound check with Dr. Pryor Browning at [facility]  [employer] [facility] [employer].  Address: [address], [place], [place] [address]  Office Phone: [phone]  Priscille Brought, MD Orthopaedic Surgery

## 2023-09-05 NOTE — Anesthesia Procedure Notes (Signed)
 Anesthesia Regional Block: Adductor canal block   Pre-Anesthetic Checklist: , timeout performed,  Correct Patient, Correct Site, Correct Laterality,  Correct Procedure, Correct Position, site marked,  Risks and benefits discussed,  Surgical consent,  Pre-op evaluation,  At surgeon's request and post-op pain management  Laterality: Lower and Right  Prep: chloraprep       Needles:  Injection technique: Single-shot  Needle Type: Echogenic Needle     Needle Length: 9cm  Needle Gauge: 22     Additional Needles:   Procedures:,,,, ultrasound used (permanent image in chart),,    Narrative:  Start time: 09/05/2023 7:59 AM End time: 09/05/2023 8:05 AM Injection made incrementally with aspirations every 5 mL.  Performed by: Personally  Anesthesiologist: Rosalita Combe, MD  Additional Notes: Block assessed prior to surgery. Pt tolerated procedure well.

## 2023-09-05 NOTE — Anesthesia Postprocedure Evaluation (Signed)
 Anesthesia Post Note  Patient: Eron Staat.  Procedure(s) Performed: ARTHROPLASTY, KNEE, TOTAL (Right: Knee)     Patient location during evaluation: Nursing Unit Anesthesia Type: Regional and Spinal Level of consciousness: oriented and awake and alert Pain management: pain level controlled Vital Signs Assessment: post-procedure vital signs reviewed and stable Respiratory status: spontaneous breathing and respiratory function stable Cardiovascular status: blood pressure returned to baseline and stable Postop Assessment: no headache, no backache, no apparent nausea or vomiting and patient able to bend at knees Anesthetic complications: no  No notable events documented.  Last Vitals:  Vitals:   09/05/23 1230 09/05/23 1255  BP: 134/77 130/75  Pulse: (!) 58 (!) 58  Resp: 11 16  Temp:  36.4 C  SpO2: 100% 100%    Last Pain:  Vitals:   09/05/23 1334  TempSrc:   PainSc: 7                  Rosalita Combe

## 2023-09-05 NOTE — Evaluation (Signed)
 Physical Therapy Evaluation Patient Details Name: Michael [name]. MRN: [id number] DOB: 1955/10/22 Today's Date: 09/05/2023  History of Present Illness  68 yo male presents to therapy s/p R TKA on 09/05/2023 due to failure of conservative measures. Pt PMH includes but is not limited to: DM II, anemia, HLD, HTN, epilepsy, angina and L TKA on 03/07/2023 .  Clinical Impression    Michael [name]. is a 68 y.o. male POD 0 s/p R TKA. Patient reports IND with mobility at baseline. Patient is now limited by functional impairments (see PT problem list below) and requires min A/CGA for bed mobility and  max A x 2 and unable to obtain upright standing for safe transfers or gait tasks at time of eval attributed to slow regression of anesthesia with residual abn sensation R foot and R LE instability. Patient instructed in exercise to facilitate ROM and circulation to manage edema. Patient will benefit from continued skilled PT interventions to address impairments and progress towards PLOF. Acute PT will follow to progress mobility and stair training in preparation for safe discharge home with caregiver support and [facility] services.       If plan is discharge home, recommend the following: Two people to help with walking and/or transfers;A lot of help with bathing/dressing/bathroom;Assistance with cooking/housework;Assist for transportation;Help with stairs or ramp for entrance   Can travel by private vehicle        Equipment Recommendations None recommended by PT  Recommendations for Other Services       Functional Status Assessment Patient has had a recent decline in their functional status and demonstrates the ability to make significant improvements in function in a reasonable and predictable amount of time.     Precautions / Restrictions Precautions Precautions: Knee;Fall (visual impairment) Restrictions Weight Bearing Restrictions Per Provider Order: No      Mobility  Bed Mobility Overal  bed mobility: Needs Assistance Bed Mobility: Supine to Sit, Sit to Supine     Supine to sit: Supervision, HOB elevated, Used rails Sit to supine: Min assist   General bed mobility comments: min cues and min A for R LE to return to bed    Transfers Overall transfer level: Needs assistance Equipment used: Rolling walker (2 wheels) Transfers: Sit to/from Stand Sit to Stand: Max assist, +2 physical assistance, +2 safety/equipment, From elevated surface           General transfer comment: PT and rehab tech attempted x 2 to assist pt to standing from elevated EOB, pt limited due to R LE abn sensation with pt reporting that he could not feel the floor and R LE instabiltiy attributed to slow regression of anethesia.    Ambulation/Gait               General Gait Details: NT for pt and staff safety due to R LE abn sensation and instability  Stairs            Wheelchair Mobility     Tilt Bed    Modified Rankin (Stroke Patients Only)       Balance Overall balance assessment: Needs assistance Sitting-balance support: Feet supported Sitting balance-Leahy Scale: Good     Standing balance support: Bilateral upper extremity supported, During functional activity, Reliant on assistive device for balance Standing balance-Leahy Scale: Zero                               Pertinent Vitals/Pain Pain  Assessment Pain Assessment: 0-10 Pain Score: 7  Pain Location: R knee and LE Pain Descriptors / Indicators: Aching, Constant, Discomfort, Dull, Operative site guarding Pain Intervention(s): Limited activity within patient's tolerance, Monitored during session, Premedicated before session, Repositioned, Ice applied    Home Living Family/patient expects to be discharged to:: Private residence Living Arrangements: Alone Available Help at Discharge: Personal care attendant;Friend(s) Type of Home: House Home Access: Stairs to enter Entrance Stairs-Rails:  None Entrance Stairs-Number of Steps: 1 Alternate Level Stairs-Number of Steps: flight Home Layout: Two level;1/2 bath on main level;Bed/bath upstairs Home Equipment: Agricultural consultant (2 wheels)      Prior Function Prior Level of Function : Independent/Modified Independent;Working/employed             Mobility Comments: IND no AD for all ADLs, self care tasks pt requires A for IADLs and due to visual impairments not driving       Extremity/Trunk Assessment        Lower Extremity Assessment Lower Extremity Assessment: RLE deficits/detail RLE Deficits / Details: ankle DF/PF 5/5; SLR AA due to quad lag RLE Sensation: decreased light touch;decreased proprioception (more pronounced once in standing)       Communication   Communication Communication: No apparent difficulties    Cognition Arousal: Alert Behavior During Therapy: WFL for tasks assessed/performed, Flat affect   PT - Cognitive impairments: No apparent impairments                         Following commands: Intact       Cueing       General Comments      Exercises Total Joint Exercises Ankle Circles/Pumps: AROM, Both, 10 reps   Assessment/Plan    PT Assessment Patient needs continued PT services  PT Problem List Decreased strength;Decreased range of motion;Decreased balance;Decreased activity tolerance;Decreased mobility;Decreased coordination;Pain       PT Treatment Interventions DME instruction;Gait training;Stair training;Functional mobility training;Therapeutic activities;Therapeutic exercise;Balance training;Neuromuscular re-education;Patient/family education;Modalities    PT Goals (Current goals can be found in the Care Plan section)  Acute Rehab PT Goals Patient Stated Goal: to be able to leave here tomorrow PT Goal Formulation: With patient Time For Goal Achievement: 09/19/23 Potential to Achieve Goals: Good    Frequency 7X/week     Co-evaluation                AM-PAC PT "6 Clicks" Mobility  Outcome Measure Help needed turning from your back to your side while in a flat bed without using bedrails?: A Little Help needed moving from lying on your back to sitting on the side of a flat bed without using bedrails?: A Little Help needed moving to and from a bed to a chair (including a wheelchair)?: Total Help needed standing up from a chair using your arms (e.g., wheelchair or bedside chair)?: Total Help needed to walk in hospital room?: Total Help needed climbing 3-5 steps with a railing? : Total 6 Click Score: 10    End of Session Equipment Utilized During Treatment: Gait belt Activity Tolerance: Other (comment) (R LE instability and abn sensation) Patient left: in bed;with call bell/phone within reach;with bed alarm set Nurse Communication: Mobility status PT Visit Diagnosis: Unsteadiness on feet (R26.81);Other abnormalities of gait and mobility (R26.89);Muscle weakness (generalized) (M62.81);Difficulty in walking, not elsewhere classified (R26.2);Pain Pain - Right/Left: Right Pain - part of body: Knee;Leg    Time: 9562-1308 PT Time Calculation (min) (ACUTE ONLY): 15 min   Charges:  PT Evaluation $PT Eval Low Complexity: 1 Low   PT General Charges $$ ACUTE PT VISIT: 1 Visit         Cary Clarks, PT Acute Rehab   Annalee Kiang 09/05/2023, 4:48 PM

## 2023-09-05 NOTE — Anesthesia Procedure Notes (Signed)
 Spinal  Start time: 09/05/2023 8:31 AM End time: 09/05/2023 8:33 AM Reason for block: surgical anesthesia Staffing Anesthesiologist: Tura Gaines, MD Resident/CRNA: Micky Albee, CRNA Performed by: Micky Albee, CRNA Authorized by: Rosalita Combe, MD   Preanesthetic Checklist Completed: patient identified, IV checked, site marked, risks and benefits discussed, surgical consent, monitors and equipment checked, pre-op evaluation and timeout performed Spinal Block Patient position: sitting Prep: DuraPrep Patient monitoring: heart rate, cardiac monitor, continuous pulse ox and blood pressure Approach: midline Location: L3-4 Needle Needle type: Pencan  Needle gauge: 24 G Needle length: 10 cm Needle insertion depth: 9 cm Assessment Sensory level: T6 Events: CSF return Additional Notes Timeout performed. Patient in sitting posiiton. L3-4 identified. Cleansed with Duraprep. SAB without difficulty. Patient to supine position.

## 2023-09-05 NOTE — Transfer of Care (Signed)
 Immediate Anesthesia Transfer of Care Note  Patient: Kai Calico.  Procedure(s) Performed: ARTHROPLASTY, KNEE, TOTAL (Right: Knee)  Patient Location: PACU  Anesthesia Type:Spinal  Level of Consciousness: sedated  Airway & Oxygen Therapy: Patient Spontanous Breathing and Patient connected to face mask oxygen  Post-op Assessment: Report given to RN and Post -op Vital signs reviewed and stable  Post vital signs: Reviewed and stable  Last Vitals:  Vitals Value Taken Time  BP 116/63 09/05/23 1115  Temp    Pulse 83 09/05/23 1115  Resp 27 09/05/23 1115  SpO2 94 % 09/05/23 1115  Vitals shown include unfiled device data.  Last Pain:  Vitals:   09/05/23 0711  TempSrc: Oral  PainSc: 3          Complications: No notable events documented.

## 2023-09-05 NOTE — Interval H&P Note (Signed)

## 2023-09-05 NOTE — Progress Notes (Signed)
 Orthopedic Tech Progress Note Patient Details:  Michael [name]. July 28, 1955 [id number]  Ortho Devices Type of Ortho Device: Bone foam zero knee Ortho Device/Splint Location: right bone foam applied in pacu Ortho Device/Splint Interventions: Ordered, Application, Adjustment   Post Interventions Patient Tolerated: Well Instructions Provided: Adjustment of device, Care of device  [name] 09/05/2023, 11:29 AM

## 2023-09-05 NOTE — Discharge Instructions (Signed)

## 2023-09-06 ENCOUNTER — Encounter (HOSPITAL_COMMUNITY): Payer: Self-pay | Admitting: Orthopedic Surgery

## 2023-09-06 DIAGNOSIS — Z87891 Personal history of nicotine dependence: Secondary | ICD-10-CM | POA: Diagnosis not present

## 2023-09-06 DIAGNOSIS — I10 Essential (primary) hypertension: Secondary | ICD-10-CM | POA: Diagnosis not present

## 2023-09-06 DIAGNOSIS — Z79899 Other long term (current) drug therapy: Secondary | ICD-10-CM | POA: Diagnosis not present

## 2023-09-06 DIAGNOSIS — Z7984 Long term (current) use of oral hypoglycemic drugs: Secondary | ICD-10-CM | POA: Diagnosis not present

## 2023-09-06 DIAGNOSIS — M1711 Unilateral primary osteoarthritis, right knee: Secondary | ICD-10-CM | POA: Diagnosis not present

## 2023-09-06 DIAGNOSIS — E119 Type 2 diabetes mellitus without complications: Secondary | ICD-10-CM | POA: Diagnosis not present

## 2023-09-06 DIAGNOSIS — Z96652 Presence of left artificial knee joint: Secondary | ICD-10-CM | POA: Diagnosis not present

## 2023-09-06 LAB — BASIC METABOLIC PANEL WITH GFR
Anion gap: 6 (ref 5–15)
BUN: 18 mg/dL (ref 8–23)
CO2: 26 mmol/L (ref 22–32)
Calcium: 8.7 mg/dL — ABNORMAL LOW (ref 8.9–10.3)
Chloride: 102 mmol/L (ref 98–111)
Creatinine, Ser: 0.85 mg/dL (ref 0.61–1.24)
GFR, Estimated: >60 mL/min (ref >60)
Glucose, Bld: 123 mg/dL — ABNORMAL HIGH (ref 70–99)
Potassium: 3.9 mmol/L (ref 3.5–5.1)
Sodium: 134 mmol/L — ABNORMAL LOW (ref 135–145)

## 2023-09-06 LAB — CBC
HCT: 33.2 % — ABNORMAL LOW (ref 39.0–52.0)
Hemoglobin: 10.1 g/dL — ABNORMAL LOW (ref 13.0–17.0)
MCH: 23.5 pg — ABNORMAL LOW (ref 26.0–34.0)
MCHC: 30.4 g/dL (ref 30.0–36.0)
MCV: 77.4 fL — ABNORMAL LOW (ref 80.0–100.0)
Platelets: 212 10*3/uL (ref 150–400)
RBC: 4.29 MIL/uL (ref 4.22–5.81)
RDW: 15.5 % (ref 11.5–15.5)
WBC: 17.8 10*3/uL — ABNORMAL HIGH (ref 4.0–10.5)
nRBC: 0 % (ref 0.0–0.2)

## 2023-09-06 LAB — GLUCOSE, CAPILLARY
Glucose-Capillary: 116 mg/dL — ABNORMAL HIGH (ref 70–99)
Glucose-Capillary: 104 mg/dL — ABNORMAL HIGH (ref 70–99)

## 2023-09-06 NOTE — Progress Notes (Signed)
 Physical Therapy Treatment Patient Details Name: Michael [name]. MRN: [id number] DOB: 05-29-1955 Today's Date: 09/06/2023   History of Present Illness 68 yo male presents to therapy s/p R TKA on 09/05/2023 due to failure of conservative measures. Pt PMH includes but is not limited to: DM II, anemia, HLD, HTN, epilepsy, angina and L TKA on 03/07/2023 .    PT Comments  Pt continues to progress with mobility including increased stability with gait, decreased assist with most tasks and negotiated stairs.  Pt very motivated to dc home this date.    If plan is discharge home, recommend the following: Assistance with cooking/housework;Assist for transportation;Help with stairs or ramp for entrance;A little help with walking and/or transfers;A little help with bathing/dressing/bathroom   Can travel by private vehicle        Equipment Recommendations  None recommended by PT    Recommendations for Other Services       Precautions / Restrictions Precautions Precautions: Knee;Fall Restrictions Weight Bearing Restrictions Per Provider Order: No     Mobility  Bed Mobility               General bed mobility comments: Up in chair and requests back to same    Transfers Overall transfer level: Needs assistance Equipment used: Rolling walker (2 wheels) Transfers: Sit to/from Stand Sit to Stand: Contact guard assist, Supervision           General transfer comment: Pt walking legs under as he pushed up on UEs    Ambulation/Gait Ambulation/Gait assistance: Contact guard assist, Supervision Gait Distance (Feet): 100 Feet Assistive device: Rolling walker (2 wheels) Gait Pattern/deviations: Step-to pattern, Step-through pattern, Decreased step length - right, Decreased step length - left, Shuffle, Trunk flexed Gait velocity: decr     General Gait Details: min cues for posture, position from RW and initial sequence; noted improvement in stability   Stairs Stairs: Yes Stairs  assistance: Contact guard assist Stair Management: No rails, Step to pattern, Forwards, With walker Number of Stairs: 1 General stair comments: min cues for sequence; pt declines to attempt 2nd time   Wheelchair Mobility     Tilt Bed    Modified Rankin (Stroke Patients Only)       Balance Overall balance assessment: Needs assistance Sitting-balance support: Feet supported Sitting balance-Leahy Scale: Good     Standing balance support: No upper extremity supported Standing balance-Leahy Scale: Fair                              Hotel manager: No apparent difficulties  Cognition Arousal: Alert Behavior During Therapy: WFL for tasks assessed/performed, Flat affect   PT - Cognitive impairments: No apparent impairments                         Following commands: Intact      Cueing    Exercises Total Joint Exercises Ankle Circles/Pumps: AROM, Both, 10 reps Quad Sets: AROM, Both, 10 reps, Supine Heel Slides: AAROM, Right, 15 reps, Supine Straight Leg Raises: AAROM, Right, 15 reps, Supine Goniometric ROM: AAROM R knee -5 - 55    General Comments        Pertinent Vitals/Pain Pain Assessment Pain Assessment: 0-10 Pain Score: 3  Pain Location: R knee and LE Pain Descriptors / Indicators: Aching, Sore Pain Intervention(s): Limited activity within patient's tolerance, Premedicated before session, Monitored during session (pt declines ice)  Home Living                          Prior Function            PT Goals (current goals can now be found in the care plan section) Acute Rehab PT Goals Patient Stated Goal: Home today PT Goal Formulation: With patient Time For Goal Achievement: 09/19/23 Potential to Achieve Goals: Good Progress towards PT goals: Progressing toward goals    Frequency    7X/week      PT Plan      Co-evaluation              AM-PAC PT "6 Clicks" Mobility    Outcome Measure  Help needed turning from your back to your side while in a flat bed without using bedrails?: A Little Help needed moving from lying on your back to sitting on the side of a flat bed without using bedrails?: A Little Help needed moving to and from a bed to a chair (including a wheelchair)?: A Little Help needed standing up from a chair using your arms (e.g., wheelchair or bedside chair)?: A Little Help needed to walk in hospital room?: A Little Help needed climbing 3-5 steps with a railing? : A Little 6 Click Score: 18    End of Session Equipment Utilized During Treatment: Gait belt Activity Tolerance: Patient tolerated treatment well Patient left: in chair;with call bell/phone within reach;with chair alarm set Nurse Communication: Mobility status PT Visit Diagnosis: Unsteadiness on feet (R26.81);Other abnormalities of gait and mobility (R26.89);Muscle weakness (generalized) (M62.81);Difficulty in walking, not elsewhere classified (R26.2);Pain Pain - Right/Left: Right Pain - part of body: Knee;Leg     Time: 9811-9147 PT Time Calculation (min) (ACUTE ONLY): 13 min  Charges:    $Gait Training: 8-22 mins $Therapeutic Exercise: 8-22 mins PT General Charges $$ ACUTE PT VISIT: 1 Visit                     Michael [name] PT Acute Rehabilitation Services Pager [phone] Office [phone]    [facility] 09/06/2023, 12:37 PM

## 2023-09-06 NOTE — Care Management Obs Status (Signed)
 MEDICARE OBSERVATION STATUS NOTIFICATION   Patient Details  Name: Michael [name]. MRN: [id number] Date of Birth: July 31, 1955   Medicare Observation Status Notification Given:  Yes    Levie Ream, RN 09/06/2023, 10:12 AM

## 2023-09-06 NOTE — Plan of Care (Signed)

## 2023-09-06 NOTE — TOC Initial Note (Addendum)
 Transition of Care (TOC) - Initial/Assessment Note    Patient Details  Name: Michael [name]. MRN: [id number] Date of Birth: 06-22-1955  Transition of Care [facility]) CM/SW Contact:    Levie Ream, RN Phone Number: 09/06/2023, 11:14 AM  Clinical Narrative:                 Spoke w/ pt in room; pt says he lives at home; he plans to return at d/c; pt identified POC Geoffery Kiel (relative) [phone]; his family will provide transportation; pt verified insurance/PCP; he denied SDOH risks; pt a=says he has cane, and walker; he or home oxygen; HHPT previously arranged w/ [facility];  Kelly at agency notified of pt's d/c; agency contact info placed in follow up provider section of d/c instructions; no TOC needs.  Expected Discharge Plan: Home/Self Care Barriers to Discharge: No Barriers Identified   Patient Goals and CMS Choice Patient states their goals for this hospitalization and ongoing recovery are:: home CMS Medicare.gov Compare Post Acute Care list provided to:: Patient        Expected Discharge Plan and Services   Discharge Planning Services: CM Consult   Living arrangements for the past 2 months: Single Family Home Expected Discharge Date: 09/06/23               DME Arranged: N/A DME Agency: NA       HH Arranged: NA HH Agency: NA        Prior Living Arrangements/Services Living arrangements for the past 2 months: Single Family Home Lives with:: Self Patient language and need for interpreter reviewed:: Yes Do you feel safe going back to the place where you live?: Yes      Need for Family Participation in Patient Care: Yes (Comment) Care giver support system in place?: Yes (comment) Current home services: DME (cane, walker) Criminal Activity/Legal Involvement Pertinent to Current Situation/Hospitalization: No - Comment as needed  Activities of Daily Living   ADL Screening (condition at time of admission) Independently performs ADLs?: Yes (appropriate  for developmental age) Is the patient deaf or have difficulty hearing?: No Does the patient have difficulty seeing, even when wearing glasses/contacts?: Yes Does the patient have difficulty concentrating, remembering, or making decisions?: No  Permission Sought/Granted Permission sought to share information with : Case Manager Permission granted to share information with : Yes, Verbal Permission Granted  Share Information with NAME: Case Manager     Permission granted to share info w Relationship: Geoffery Kiel (relative) [phone]     Emotional Assessment Appearance:: Appears stated age Attitude/Demeanor/Rapport: Gracious Affect (typically observed): Accepting Orientation: : Oriented to Self, Oriented to Place, Oriented to  Time, Oriented to Situation Alcohol  / Substance Use: Not Applicable Psych Involvement: No (comment)  Admission diagnosis:  Primary osteoarthritis of right knee [M17.11] Patient Active Problem List   Diagnosis Date Noted   Primary osteoarthritis of right knee 09/05/2023   Bilateral primary osteoarthritis of knee 07/23/2023   Pre-op exam 07/23/2023   Primary osteoarthritis of left knee 03/07/2023   Diabetes mellitus due to underlying condition with diabetic autonomic neuropathy (HCC) 08/29/2017   Anemia 09/27/2016   Hyperlipidemia 09/27/2016   Morbid obesity (HCC) 09/27/2016   Essential hypertension 08/23/2016   Arthritis 08/21/2016   PCP:  Verma Gobble, NP Pharmacy:   [id number] [address] ([address]), [place] - [address] [place] ([place]) [place] [address] Phone: [phone] Fax: [phone]     Social Drivers of Health (SDOH) Social History:  SDOH Screenings   Food Insecurity: No Food Insecurity (09/06/2023)  Housing: Low Risk  (09/06/2023)  Transportation Needs: No Transportation Needs (09/06/2023)  Utilities: Not At Risk (09/06/2023)  Depression (PHQ2-9): Low Risk  (07/23/2023)  Financial Resource Strain: Low Risk   (04/13/2023)  Physical Activity: Unknown (04/13/2023)  Social Connections: Unknown (09/05/2023)  Stress: No Stress Concern Present (04/13/2023)  Tobacco Use: Medium Risk (09/05/2023)   SDOH Interventions: Food Insecurity Interventions: Intervention Not Indicated, Inpatient TOC Housing Interventions: Intervention Not Indicated, Inpatient TOC Transportation Interventions: Intervention Not Indicated, Inpatient TOC Utilities Interventions: Intervention Not Indicated, Inpatient TOC   Readmission Risk Interventions     No data to display

## 2023-09-06 NOTE — Discharge Summary (Signed)
 Physician Discharge Summary  Patient ID: Michael [name]. MRN: [id number] DOB/AGE: 1956/01/24 68 y.o.  Admit date: 09/05/2023 Discharge date: 09/06/2023  Admission Diagnoses:  Primary osteoarthritis of right knee  Discharge Diagnoses:  Principal Problem:   Primary osteoarthritis of right knee   Past Medical History:  Diagnosis Date   Anemia    Arthritis    Chest pain    Per incoming records from [facility]    Diabetes mellitus without complication (HCC)    Epilepsy (HCC) 1971   seizure  last one 1971, etiology unknown per patient.   Hypertension    Obesity, morbid, BMI 50 or higher (HCC)    Per incoming records from [facility]    Sleep apnea    no CPAP   Type 2 diabetes mellitus (HCC)     Surgeries: Procedure(s): ARTHROPLASTY, KNEE, TOTAL on 09/05/2023   Consultants (if any):   Discharged Condition: Improved  Hospital Course: Yordin Rhoda. is an 68 y.o. male who was admitted 09/05/2023 with a diagnosis of Primary osteoarthritis of right knee and went to the operating room on 09/05/2023 and underwent the above named procedures.    He was given perioperative antibiotics:  Anti-infectives (From admission, onward)    Start     Dose/Rate Route Frequency Ordered Stop   09/05/23 1400  ceFAZolin  (ANCEF ) IVPB 2g/100 mL premix        2 g 200 mL/hr over 30 Minutes Intravenous Every 6 hours 09/05/23 1250 09/05/23 2025   09/05/23 0700  ceFAZolin  (ANCEF ) IVPB 3g/150 mL premix        3 g 300 mL/hr over 30 Minutes Intravenous On call to O.R. 09/05/23 1308 09/05/23 [id number]     .  He was given sequential compression devices, early ambulation, and Aspirin  for DVT prophylaxis.  He benefited maximally from the hospital stay and there were no complications.    Recent vital signs:  Vitals:   09/06/23 0149 09/06/23 0550  BP: 130/67 129/69  Pulse: 88 78  Resp: 17 17  Temp: 97.7 F (36.5 C) 98 F (36.7 C)  SpO2: 100% 100%    Recent laboratory studies:  Lab Results   Component Value Date   HGB 10.1 (L) 09/06/2023   HGB 11.8 (L) 08/29/2023   HGB 11.8 (L) 07/23/2023   Lab Results  Component Value Date   WBC 17.8 (H) 09/06/2023   PLT 212 09/06/2023   No results found for: "INR" Lab Results  Component Value Date   NA 134 (L) 09/06/2023   K 3.9 09/06/2023   CL 102 09/06/2023   CO2 26 09/06/2023   BUN 18 09/06/2023   CREATININE 0.85 09/06/2023   GLUCOSE 123 (H) 09/06/2023    Discharge Medications:   Allergies as of 09/06/2023   No Known Allergies      Medication List     TAKE these medications    acetaminophen  500 MG tablet Commonly known as: TYLENOL  Take 2 tablets (1,000 mg total) by mouth every 8 (eight) hours as needed.   amLODipine  10 MG tablet Commonly known as: NORVASC  Take 1 tablet by mouth once daily   aspirin  EC 81 MG tablet Take 1 tablet (81 mg total) by mouth 2 (two) times daily for 28 days. Swallow whole.   atorvastatin  20 MG tablet Commonly known as: LIPITOR Take 1 tablet by mouth once daily   celecoxib  100 MG capsule Commonly known as: CeleBREX  Take 1 capsule (100 mg total) by mouth 2 (two) times daily for  14 days.   hydrochlorothiazide  25 MG tablet Commonly known as: HYDRODIURIL  Take 1 tablet by mouth once daily   Jardiance  10 MG Tabs tablet Generic drug: empagliflozin  TAKE 1 TABLET BY MOUTH ONCE DAILY BEFORE BREAKFAST   lisinopril  10 MG tablet Commonly known as: ZESTRIL  Take 1 tablet by mouth once daily   metFORMIN  1000 MG tablet Commonly known as: GLUCOPHAGE  TAKE 1 TABLET BY MOUTH TWICE DAILY WITH A MEAL   methocarbamol  500 MG tablet Commonly known as: ROBAXIN  Take 1 tablet (500 mg total) by mouth every 8 (eight) hours as needed for up to 10 days for muscle spasms.   omeprazole 40 MG capsule Commonly known as: PRILOSEC Take 1 capsule (40 mg total) by mouth daily for 21 days.   ondansetron  4 MG tablet Commonly known as: Zofran  Take 1 tablet (4 mg total) by mouth every 8 (eight) hours as  needed for up to 14 days for nausea or vomiting.   oxyCODONE  5 MG immediate release tablet Commonly known as: Roxicodone  Take 1 tablet (5 mg total) by mouth every 4 (four) hours as needed for up to 7 days for severe pain (pain score 7-10) or moderate pain (pain score 4-6).   polyethylene glycol 17 g packet Commonly known as: MiraLax  Take 17 g by mouth daily.        Diagnostic Studies: DG Knee Right Port Result Date: 09/05/2023 CLINICAL DATA:  Postop right knee replacement. EXAM: PORTABLE RIGHT KNEE - 1-2 VIEW COMPARISON:  None Available. FINDINGS: Right knee arthroplasty in place. Straightening of the joint with slight anterior translation of the tibial component with respect to the femoral component, may be positional. No periprosthetic lucency or fracture. Recent postsurgical change includes air and edema in the joint space and soft tissues. IMPRESSION: Right knee arthroplasty. Slight anterior translation of the tibial component with respect to the femoral component may be positional. Electronically Signed   By: Chadwick Colonel M.D.   On: 09/05/2023 12:30    Disposition: Discharge disposition: 01-Home or Self Care       Discharge Instructions     Call MD / Call 911   Complete by: As directed    If you experience chest pain or shortness of breath, CALL 911 and be transported to the hospital emergency room.  If you develope a fever above 101 F, pus (white drainage) or increased drainage or redness at the wound, or calf pain, call your surgeon's office.   Constipation Prevention   Complete by: As directed    Drink plenty of fluids.  Prune juice may be helpful.  You may use a stool softener, such as Colace (over the counter) 100 mg twice a day.  Use MiraLax  (over the counter) for constipation as needed.   Diet - low sodium heart healthy   Complete by: As directed    Do not put a pillow under the knee. Place it under the heel.   Complete by: As directed    Driving restrictions    Complete by: As directed    No driving for 4-6 weeks   Increase activity slowly as tolerated   Complete by: As directed    Post-operative opioid taper instructions:   Complete by: As directed    POST-OPERATIVE OPIOID TAPER INSTRUCTIONS: It is important to wean off of your opioid medication as soon as possible. If you do not need pain medication after your surgery it is ok to stop day one. Opioids include: Codeine, Hydrocodone (Norco, Vicodin), Oxycodone (Percocet, oxycontin ) and hydromorphone  amongst  others.  Long term and even short term use of opiods can cause: Increased pain response Dependence Constipation Depression Respiratory depression And more.  Withdrawal symptoms can include Flu like symptoms Nausea, vomiting And more Techniques to manage these symptoms Hydrate well Eat regular healthy meals Stay active Use relaxation techniques(deep breathing, meditating, yoga) Do Not substitute Alcohol  to help with tapering If you have been on opioids for less than two weeks and do not have pain than it is ok to stop all together.  Plan to wean off of opioids This plan should start within one week post op of your joint replacement. Maintain the same interval or time between taking each dose and first decrease the dose.  Cut the total daily intake of opioids by one tablet each day Next start to increase the time between doses. The last dose that should be eliminated is the evening dose.      TED hose   Complete by: As directed    Use stockings (TED hose) for 2 weeks on both leg(s).  Then for 2 more weeks on the surgical leg.  You may remove them at night for sleeping.        Follow-up Information     Murleen Arms, MD Follow up in 2 week(s).   Specialty: Orthopedic Surgery Contact information: [address] [place] [address] [phone]                    Discharge Instructions      INSTRUCTIONS AFTER JOINT REPLACEMENT   Remove  items at home which could result in a fall. This includes throw rugs or furniture in walking pathways ICE to the affected joint every three hours while awake for 30 minutes at a time, for at least the first 3-5 days, and then as needed for pain and swelling.  Continue to use ice for pain and swelling. You may notice swelling that will progress down to the foot and ankle.  This is normal after surgery.  Elevate your leg when you are not up walking on it.   Continue to use the breathing machine you got in the hospital (incentive spirometer) which will help keep your temperature down.  It is common for your temperature to cycle up and down following surgery, especially at night when you are not up moving around and exerting yourself.  The breathing machine keeps your lungs expanded and your temperature down.  DIET:  As you were doing prior to hospitalization, we recommend a well-balanced diet.  DRESSING / WOUND CARE / SHOWERING:  Keep the surgical dressing until follow up.  The dressing is water proof, so you can shower without any extra covering.  IF THE DRESSING FALLS OFF or the wound gets wet inside, change the dressing with sterile gauze.  Please use good hand washing techniques before changing the dressing.  Do not use any lotions or creams on the incision until instructed by your surgeon.    ACTIVITY  Increase activity slowly as tolerated, but follow the weight bearing instructions below.   No driving for 6 weeks or until further direction given by your physician.  You cannot drive while taking narcotics.  No lifting or carrying greater than 10 lbs. until further directed by your surgeon. Avoid periods of inactivity such as sitting longer than an hour when not asleep. This helps prevent blood clots.  You may return to work once you are authorized by your doctor.   WEIGHT BEARING:  Weight bearing as tolerated with assist device (walker, cane, etc) as directed, use it as long as suggested by your  surgeon or therapist, typically at least 4-6 weeks.  EXERCISES  Results after joint replacement surgery are often greatly improved when you follow the exercise, range of motion and muscle strengthening exercises prescribed by your doctor. Safety measures are also important to protect the joint from further injury. Any time any of these exercises cause you to have increased pain or swelling, decrease what you are doing until you are comfortable again and then slowly increase them. If you have problems or questions, call your caregiver or physical therapist for advice.   Rehabilitation is important following a joint replacement. After just a few days of immobilization, the muscles of the leg can become weakened and shrink (atrophy).  These exercises are designed to build up the tone and strength of the thigh and leg muscles and to improve motion. Often times heat used for twenty to thirty minutes before working out will loosen up your tissues and help with improving the range of motion but do not use heat for the first two weeks following surgery (sometimes heat can increase post-operative swelling).   These exercises can be done on a training (exercise) mat, on the floor, on a table or on a bed. Use whatever works the best and is most comfortable for you.    Use music or television while you are exercising so that the exercises are a pleasant break in your day. This will make your life better with the exercises acting as a break in your routine that you can look forward to.   Perform all exercises about fifteen times, three times per day or as directed.  You should exercise both the operative leg and the other leg as well.  Exercises include:   Quad Sets - Tighten up the muscle on the front of the thigh (Quad) and hold for 5-10 seconds.   Straight Leg Raises - With your knee straight (if you were given a brace, keep it on), lift the leg to 60 degrees, hold for 3 seconds, and slowly lower the leg.   Perform this exercise against resistance later as your leg gets stronger.  Leg Slides: Lying on your back, slowly slide your foot toward your buttocks, bending your knee up off the floor (only go as far as is comfortable). Then slowly slide your foot back down until your leg is flat on the floor again.  Angel Wings: Lying on your back spread your legs to the side as far apart as you can without causing discomfort.  Hamstring Strength:  Lying on your back, push your heel against the floor with your leg straight by tightening up the muscles of your buttocks.  Repeat, but this time bend your knee to a comfortable angle, and push your heel against the floor.  You may put a pillow under the heel to make it more comfortable if necessary.   A rehabilitation program following joint replacement surgery can speed recovery and prevent re-injury in the future due to weakened muscles. Contact your doctor or a physical therapist for more information on knee rehabilitation.   CONSTIPATION:  Constipation is defined medically as fewer than three stools per week and severe constipation as less than one stool per week.  Even if you have a regular bowel pattern at home, your normal regimen is likely to be disrupted due to multiple reasons following surgery.  Combination of anesthesia, postoperative narcotics, change  in appetite and fluid intake all can affect your bowels.   YOU MUST use at least one of the following options; they are listed in order of increasing strength to get the job done.  They are all available over the counter, and you may need to use some, POSSIBLY even all of these options:    Drink plenty of fluids (prune juice may be helpful) and high fiber foods Colace 100 mg by mouth twice a day  Senokot for constipation as directed and as needed Dulcolax (bisacodyl), take with full glass of water  Miralax  (polyethylene glycol) once or twice a day as needed.  If you have tried all these things and are unable  to have a bowel movement in the first 3-4 days after surgery call either your surgeon or your primary doctor.    If you experience loose stools or diarrhea, hold the medications until you stool forms back up.  If your symptoms do not get better within 1 week or if they get worse, check with your doctor.  If you experience "the worst abdominal pain ever" or develop nausea or vomiting, please contact the office immediately for further recommendations for treatment.  ITCHING:  If you experience itching with your medications, try taking only a single pain pill, or even half a pain pill at a time.  You can also use Benadryl  over the counter for itching or also to help with sleep.   TED HOSE STOCKINGS:  Use stockings on both legs until for at least 2 weeks or as directed by physician office. They may be removed at night for sleeping.  MEDICATIONS:  See your medication summary on the "After Visit Summary" that nursing will review with you.  You may have some home medications which will be placed on hold until you complete the course of blood thinner medication.  It is important for you to complete the blood thinner medication as prescribed.  Blood clot prevention (DVT Prophylaxis): After surgery you are at an increased risk for a blood clot.  You were prescribed a blood thinner, Aspirin  81mg , to be taken twice daily for a total of 4 weeks from surgery to help reduce your risk of getting a blood clot.  Signs of a pulmonary embolus (blood clot in the lungs) include sudden short of breath, feeling lightheaded or dizzy, chest pain with a deep breath, rapid pulse rapid breathing.  Signs of a blood clot in your arms or legs include new unexplained swelling and cramping, warm, red or darkened skin around the painful area.  Please call the office or 911 right away if these signs or symptoms develop.  PRECAUTIONS:   If you experience chest pain or shortness of breath - call 911 immediately for transfer to the hospital  emergency department.   If you develop a fever greater that 101 F, purulent drainage from wound, increased redness or drainage from wound, foul odor from the wound/dressing, or calf pain - CONTACT YOUR SURGEON.                                                   FOLLOW-UP APPOINTMENTS:  If you do not already have a post-op appointment, please call the office for an appointment to be seen by your surgeon.  Guidelines for how soon to be seen are listed in your "After Visit Summary",  but are typically between 2-3 weeks after surgery.  If you have a specialized bandage, you may be told to follow up 1 week after surgery.  OTHER INSTRUCTIONS:  Knee Replacement:  Do not place pillow under knee, focus on keeping the knee straight while resting.  Place foam block, curve side up under heel at all times except when walking.  DO NOT modify, tear, cut, or change the foam block in any way.  POST-OPERATIVE OPIOID TAPER INSTRUCTIONS: It is important to wean off of your opioid medication as soon as possible. If you do not need pain medication after your surgery it is ok to stop day one. Opioids include: Codeine, Hydrocodone (Norco, Vicodin), Oxycodone (Percocet, oxycontin ) and hydromorphone  amongst others.  Long term and even short term use of opiods can cause: Increased pain response Dependence Constipation Depression Respiratory depression And more.  Withdrawal symptoms can include Flu like symptoms Nausea, vomiting And more Techniques to manage these symptoms Hydrate well Eat regular healthy meals Stay active Use relaxation techniques(deep breathing, meditating, yoga) Do Not substitute Alcohol  to help with tapering If you have been on opioids for less than two weeks and do not have pain than it is ok to stop all together.  Plan to wean off of opioids This plan should start within one week post op of your joint replacement. Maintain the same interval or time between taking each dose and first decrease  the dose.  Cut the total daily intake of opioids by one tablet each day Next start to increase the time between doses. The last dose that should be eliminated is the evening dose.   MAKE SURE YOU:  Understand these instructions.  Get help right away if you are not doing well or get worse.    Thank you for letting us  be a part of your medical care team.  It is a privilege we respect greatly.  We hope these instructions will help you stay on track for a fast and full recovery!           Signed: Albertus Alt 09/06/2023, 7:14 AM

## 2023-09-06 NOTE — Progress Notes (Signed)
     1 Day Post-Op Procedure(s) (LRB): ARTHROPLASTY, KNEE, TOTAL (Right)  Subjective:  Patient reports pain as mild.  Slept okay.  Tried to mobilize with PT yesterday with some difficulty due to some lingering block effects.  Feels ready to go home today and has transportation set up to do so.  Denies chest pain, ShOB, N/V.  Denies numbness or tingling.  No other complaints at this time.  Objective:   VITALS:   Vitals:   09/05/23 1255 09/05/23 2228 09/06/23 0149 09/06/23 0550  BP: 130/75 138/80 130/67 129/69  Pulse: (!) 58 82 88 78  Resp: 16 18 17 17   Temp: 97.6 F (36.4 C) 97.9 F (36.6 C) 97.7 F (36.5 C) 98 F (36.7 C)  TempSrc:  Oral Oral Oral  SpO2: 100% 98% 100% 100%  Weight:      Height:        AAOx4, in NAD sitting comfortably Neurologically intact Sensation intact distally Intact pulses distally Dorsiflexion/Plantar flexion intact Incision: dressing C/D/I Wiggles toes appropriately   Lab Results  Component Value Date   WBC 17.8 (H) 09/06/2023   HGB 10.1 (L) 09/06/2023   HCT 33.2 (L) 09/06/2023   MCV 77.4 (L) 09/06/2023   PLT 212 09/06/2023   BMET    Component Value Date/Time   NA 134 (L) 09/06/2023 0351   NA 139 11/12/2015 0000   K 3.9 09/06/2023 0351   CL 102 09/06/2023 0351   CO2 26 09/06/2023 0351   GLUCOSE 123 (H) 09/06/2023 0351   BUN 18 09/06/2023 0351   BUN 12 11/12/2015 0000   CREATININE 0.85 09/06/2023 0351   CREATININE 0.89 07/23/2023 1331   CALCIUM  8.7 (L) 09/06/2023 0351   EGFR 91 10/04/2022 0813   GFRNONAA >60 09/06/2023 0351   GFRNONAA 87 09/10/2020 0819     Xray: stable post-operative imaging  Assessment/Plan: 1 Day Post-Op   Principal Problem:   Primary osteoarthritis of right knee  S/P Right Total Knee Arthroplasty 09/05/2023  Post op recs: WB: WBAT Abx: ancef  Imaging: PACU xrays DVT prophylaxis: Aspirin  81mg  BID x4 weeks Follow up: 2 weeks after surgery for a wound check with Dr. Pryor Browning at [facility].  Address: [address], [place], [place] [address]  Office Phone: [phone]   Albertus Alt 09/06/2023, 6:27 AM   Contact information:   Weekdays 7am-5pm epic message Dr. Pryor Browning, or call office for patient follow up: [phone] After hours and holidays please check [email or web address] for group call information for [facility]

## 2023-09-06 NOTE — Progress Notes (Signed)
 Physical Therapy Treatment Patient Details Name: Michael [name]. MRN: [id number] DOB: [birth year] Today's Date: 09/06/2023   History of Present Illness 68 yo male presents to therapy s/p R TKA on 09/05/2023 due to failure of conservative measures. Pt PMH includes but is not limited to: DM II, anemia, HLD, HTN, epilepsy, angina and L TKA on 03/07/2023 .    PT Comments  Pt with marked improvement in mobility.  Pt performed therex program and up to ambulate in hall - initial instability but improved with increased distance.  Pt hopeful for dc home this date.    If plan is discharge home, recommend the following: Assistance with cooking/housework;Assist for transportation;Help with stairs or ramp for entrance;A little help with walking and/or transfers;A little help with bathing/dressing/bathroom   Can travel by private vehicle        Equipment Recommendations  None recommended by PT    Recommendations for Other Services       Precautions / Restrictions Precautions Precautions: Knee;Fall Restrictions Weight Bearing Restrictions Per Provider Order: No     Mobility  Bed Mobility               General bed mobility comments: Up in chair and requests back to same    Transfers Overall transfer level: Needs assistance Equipment used: Rolling walker (2 wheels) Transfers: Sit to/from Stand Sit to Stand: Min assist, Contact guard assist           General transfer comment: Pt walking legs under as he pushed up on UEs - Steady assist only    Ambulation/Gait Ambulation/Gait assistance: Min assist, Contact guard assist Gait Distance (Feet): 100 Feet Assistive device: Rolling walker (2 wheels) Gait Pattern/deviations: Step-to pattern, Step-through pattern, Decreased step length - right, Decreased step length - left, Shuffle, Trunk flexed Gait velocity: decr     General Gait Details: cues for posture, position from RW and initial sequence; stability improving with  distance   Stairs             Wheelchair Mobility     Tilt Bed    Modified Rankin (Stroke Patients Only)       Balance Overall balance assessment: Needs assistance Sitting-balance support: Feet supported Sitting balance-Leahy Scale: Good     Standing balance support: Bilateral upper extremity supported Standing balance-Leahy Scale: Poor                              Communication Communication Communication: No apparent difficulties  Cognition Arousal: Alert Behavior During Therapy: WFL for tasks assessed/performed, Flat affect   PT - Cognitive impairments: No apparent impairments                         Following commands: Intact      Cueing    Exercises Total Joint Exercises Ankle Circles/Pumps: AROM, Both, 10 reps Quad Sets: AROM, Both, 10 reps, Supine Heel Slides: AAROM, Right, 15 reps, Supine Straight Leg Raises: AAROM, Right, 15 reps, Supine Goniometric ROM: AAROM R knee -5 - 55    General Comments        Pertinent Vitals/Pain Pain Assessment Pain Assessment: 0-10 Pain Score: 4  Pain Location: R knee and LE Pain Descriptors / Indicators: Aching, Sore Pain Intervention(s): Limited activity within patient's tolerance, Monitored during session, Premedicated before session, Ice applied    Home Living  Prior Function            PT Goals (current goals can now be found in the care plan section) Acute Rehab PT Goals Patient Stated Goal: Home today PT Goal Formulation: With patient Time For Goal Achievement: 09/19/23 Potential to Achieve Goals: Good Progress towards PT goals: Progressing toward goals    Frequency    7X/week      PT Plan      Co-evaluation              AM-PAC PT "6 Clicks" Mobility   Outcome Measure  Help needed turning from your back to your side while in a flat bed without using bedrails?: A Little Help needed moving from lying on your back to  sitting on the side of a flat bed without using bedrails?: A Little Help needed moving to and from a bed to a chair (including a wheelchair)?: A Little Help needed standing up from a chair using your arms (e.g., wheelchair or bedside chair)?: A Little Help needed to walk in hospital room?: A Little Help needed climbing 3-5 steps with a railing? : A Lot 6 Click Score: 17    End of Session Equipment Utilized During Treatment: Gait belt Activity Tolerance: Patient tolerated treatment well Patient left: in chair;with call bell/phone within reach;with chair alarm set Nurse Communication: Mobility status PT Visit Diagnosis: Unsteadiness on feet (R26.81);Other abnormalities of gait and mobility (R26.89);Muscle weakness (generalized) (M62.81);Difficulty in walking, not elsewhere classified (R26.2);Pain Pain - Right/Left: Right Pain - part of body: Knee;Leg     Time: 1610-9604 PT Time Calculation (min) (ACUTE ONLY): 20 min  Charges:    $Therapeutic Exercise: 8-22 mins PT General Charges $$ ACUTE PT VISIT: 1 Visit                     Thedora Finlay PT Acute Rehabilitation Services Pager [phone] Office [phone]    [facility] 09/06/2023, 12:31 PM

## 2023-09-07 DIAGNOSIS — D649 Anemia, unspecified: Secondary | ICD-10-CM | POA: Diagnosis not present

## 2023-09-07 DIAGNOSIS — Z791 Long term (current) use of non-steroidal anti-inflammatories (NSAID): Secondary | ICD-10-CM | POA: Diagnosis not present

## 2023-09-07 DIAGNOSIS — I10 Essential (primary) hypertension: Secondary | ICD-10-CM | POA: Diagnosis not present

## 2023-09-07 DIAGNOSIS — Z87891 Personal history of nicotine dependence: Secondary | ICD-10-CM | POA: Diagnosis not present

## 2023-09-07 DIAGNOSIS — E785 Hyperlipidemia, unspecified: Secondary | ICD-10-CM | POA: Diagnosis not present

## 2023-09-07 DIAGNOSIS — Z96653 Presence of artificial knee joint, bilateral: Secondary | ICD-10-CM | POA: Diagnosis not present

## 2023-09-07 DIAGNOSIS — K5900 Constipation, unspecified: Secondary | ICD-10-CM | POA: Diagnosis not present

## 2023-09-07 DIAGNOSIS — G4730 Sleep apnea, unspecified: Secondary | ICD-10-CM | POA: Diagnosis not present

## 2023-09-07 DIAGNOSIS — Z7984 Long term (current) use of oral hypoglycemic drugs: Secondary | ICD-10-CM | POA: Diagnosis not present

## 2023-09-07 DIAGNOSIS — E1143 Type 2 diabetes mellitus with diabetic autonomic (poly)neuropathy: Secondary | ICD-10-CM | POA: Diagnosis not present

## 2023-09-07 DIAGNOSIS — G40909 Epilepsy, unspecified, not intractable, without status epilepticus: Secondary | ICD-10-CM | POA: Diagnosis not present

## 2023-09-07 DIAGNOSIS — Z7982 Long term (current) use of aspirin: Secondary | ICD-10-CM | POA: Diagnosis not present

## 2023-09-07 DIAGNOSIS — K449 Diaphragmatic hernia without obstruction or gangrene: Secondary | ICD-10-CM | POA: Diagnosis not present

## 2023-09-07 DIAGNOSIS — Z6841 Body Mass Index (BMI) 40.0 and over, adult: Secondary | ICD-10-CM | POA: Diagnosis not present

## 2023-09-07 DIAGNOSIS — Z471 Aftercare following joint replacement surgery: Secondary | ICD-10-CM | POA: Diagnosis not present

## 2023-09-09 DIAGNOSIS — Z6841 Body Mass Index (BMI) 40.0 and over, adult: Secondary | ICD-10-CM | POA: Diagnosis not present

## 2023-09-09 DIAGNOSIS — Z7984 Long term (current) use of oral hypoglycemic drugs: Secondary | ICD-10-CM | POA: Diagnosis not present

## 2023-09-09 DIAGNOSIS — E1143 Type 2 diabetes mellitus with diabetic autonomic (poly)neuropathy: Secondary | ICD-10-CM | POA: Diagnosis not present

## 2023-09-09 DIAGNOSIS — G40909 Epilepsy, unspecified, not intractable, without status epilepticus: Secondary | ICD-10-CM | POA: Diagnosis not present

## 2023-09-09 DIAGNOSIS — I10 Essential (primary) hypertension: Secondary | ICD-10-CM | POA: Diagnosis not present

## 2023-09-09 DIAGNOSIS — Z87891 Personal history of nicotine dependence: Secondary | ICD-10-CM | POA: Diagnosis not present

## 2023-09-09 DIAGNOSIS — Z471 Aftercare following joint replacement surgery: Secondary | ICD-10-CM | POA: Diagnosis not present

## 2023-09-09 DIAGNOSIS — Z96653 Presence of artificial knee joint, bilateral: Secondary | ICD-10-CM | POA: Diagnosis not present

## 2023-09-09 DIAGNOSIS — Z7982 Long term (current) use of aspirin: Secondary | ICD-10-CM | POA: Diagnosis not present

## 2023-09-09 DIAGNOSIS — K449 Diaphragmatic hernia without obstruction or gangrene: Secondary | ICD-10-CM | POA: Diagnosis not present

## 2023-09-09 DIAGNOSIS — G4730 Sleep apnea, unspecified: Secondary | ICD-10-CM | POA: Diagnosis not present

## 2023-09-09 DIAGNOSIS — Z791 Long term (current) use of non-steroidal anti-inflammatories (NSAID): Secondary | ICD-10-CM | POA: Diagnosis not present

## 2023-09-09 DIAGNOSIS — D649 Anemia, unspecified: Secondary | ICD-10-CM | POA: Diagnosis not present

## 2023-09-09 DIAGNOSIS — E785 Hyperlipidemia, unspecified: Secondary | ICD-10-CM | POA: Diagnosis not present

## 2023-09-09 DIAGNOSIS — K5900 Constipation, unspecified: Secondary | ICD-10-CM | POA: Diagnosis not present

## 2023-09-11 DIAGNOSIS — E785 Hyperlipidemia, unspecified: Secondary | ICD-10-CM | POA: Diagnosis not present

## 2023-09-11 DIAGNOSIS — Z6841 Body Mass Index (BMI) 40.0 and over, adult: Secondary | ICD-10-CM | POA: Diagnosis not present

## 2023-09-11 DIAGNOSIS — Z7984 Long term (current) use of oral hypoglycemic drugs: Secondary | ICD-10-CM | POA: Diagnosis not present

## 2023-09-11 DIAGNOSIS — Z96653 Presence of artificial knee joint, bilateral: Secondary | ICD-10-CM | POA: Diagnosis not present

## 2023-09-11 DIAGNOSIS — K449 Diaphragmatic hernia without obstruction or gangrene: Secondary | ICD-10-CM | POA: Diagnosis not present

## 2023-09-11 DIAGNOSIS — K5900 Constipation, unspecified: Secondary | ICD-10-CM | POA: Diagnosis not present

## 2023-09-11 DIAGNOSIS — D649 Anemia, unspecified: Secondary | ICD-10-CM | POA: Diagnosis not present

## 2023-09-11 DIAGNOSIS — E1143 Type 2 diabetes mellitus with diabetic autonomic (poly)neuropathy: Secondary | ICD-10-CM | POA: Diagnosis not present

## 2023-09-11 DIAGNOSIS — Z471 Aftercare following joint replacement surgery: Secondary | ICD-10-CM | POA: Diagnosis not present

## 2023-09-11 DIAGNOSIS — G4730 Sleep apnea, unspecified: Secondary | ICD-10-CM | POA: Diagnosis not present

## 2023-09-11 DIAGNOSIS — Z791 Long term (current) use of non-steroidal anti-inflammatories (NSAID): Secondary | ICD-10-CM | POA: Diagnosis not present

## 2023-09-11 DIAGNOSIS — Z87891 Personal history of nicotine dependence: Secondary | ICD-10-CM | POA: Diagnosis not present

## 2023-09-11 DIAGNOSIS — Z7982 Long term (current) use of aspirin: Secondary | ICD-10-CM | POA: Diagnosis not present

## 2023-09-11 DIAGNOSIS — G40909 Epilepsy, unspecified, not intractable, without status epilepticus: Secondary | ICD-10-CM | POA: Diagnosis not present

## 2023-09-11 DIAGNOSIS — I10 Essential (primary) hypertension: Secondary | ICD-10-CM | POA: Diagnosis not present

## 2023-09-13 DIAGNOSIS — K449 Diaphragmatic hernia without obstruction or gangrene: Secondary | ICD-10-CM | POA: Diagnosis not present

## 2023-09-13 DIAGNOSIS — Z7982 Long term (current) use of aspirin: Secondary | ICD-10-CM | POA: Diagnosis not present

## 2023-09-13 DIAGNOSIS — E785 Hyperlipidemia, unspecified: Secondary | ICD-10-CM | POA: Diagnosis not present

## 2023-09-13 DIAGNOSIS — G4730 Sleep apnea, unspecified: Secondary | ICD-10-CM | POA: Diagnosis not present

## 2023-09-13 DIAGNOSIS — Z96653 Presence of artificial knee joint, bilateral: Secondary | ICD-10-CM | POA: Diagnosis not present

## 2023-09-13 DIAGNOSIS — D649 Anemia, unspecified: Secondary | ICD-10-CM | POA: Diagnosis not present

## 2023-09-13 DIAGNOSIS — Z6841 Body Mass Index (BMI) 40.0 and over, adult: Secondary | ICD-10-CM | POA: Diagnosis not present

## 2023-09-13 DIAGNOSIS — Z471 Aftercare following joint replacement surgery: Secondary | ICD-10-CM | POA: Diagnosis not present

## 2023-09-13 DIAGNOSIS — I10 Essential (primary) hypertension: Secondary | ICD-10-CM | POA: Diagnosis not present

## 2023-09-13 DIAGNOSIS — Z791 Long term (current) use of non-steroidal anti-inflammatories (NSAID): Secondary | ICD-10-CM | POA: Diagnosis not present

## 2023-09-13 DIAGNOSIS — G40909 Epilepsy, unspecified, not intractable, without status epilepticus: Secondary | ICD-10-CM | POA: Diagnosis not present

## 2023-09-13 DIAGNOSIS — Z87891 Personal history of nicotine dependence: Secondary | ICD-10-CM | POA: Diagnosis not present

## 2023-09-13 DIAGNOSIS — E1143 Type 2 diabetes mellitus with diabetic autonomic (poly)neuropathy: Secondary | ICD-10-CM | POA: Diagnosis not present

## 2023-09-13 DIAGNOSIS — Z7984 Long term (current) use of oral hypoglycemic drugs: Secondary | ICD-10-CM | POA: Diagnosis not present

## 2023-09-13 DIAGNOSIS — K5900 Constipation, unspecified: Secondary | ICD-10-CM | POA: Diagnosis not present

## 2023-09-17 DIAGNOSIS — I10 Essential (primary) hypertension: Secondary | ICD-10-CM | POA: Diagnosis not present

## 2023-09-17 DIAGNOSIS — Z6841 Body Mass Index (BMI) 40.0 and over, adult: Secondary | ICD-10-CM | POA: Diagnosis not present

## 2023-09-17 DIAGNOSIS — Z7982 Long term (current) use of aspirin: Secondary | ICD-10-CM | POA: Diagnosis not present

## 2023-09-17 DIAGNOSIS — Z96653 Presence of artificial knee joint, bilateral: Secondary | ICD-10-CM | POA: Diagnosis not present

## 2023-09-17 DIAGNOSIS — G4730 Sleep apnea, unspecified: Secondary | ICD-10-CM | POA: Diagnosis not present

## 2023-09-17 DIAGNOSIS — Z87891 Personal history of nicotine dependence: Secondary | ICD-10-CM | POA: Diagnosis not present

## 2023-09-17 DIAGNOSIS — E1143 Type 2 diabetes mellitus with diabetic autonomic (poly)neuropathy: Secondary | ICD-10-CM | POA: Diagnosis not present

## 2023-09-17 DIAGNOSIS — D649 Anemia, unspecified: Secondary | ICD-10-CM | POA: Diagnosis not present

## 2023-09-17 DIAGNOSIS — G40909 Epilepsy, unspecified, not intractable, without status epilepticus: Secondary | ICD-10-CM | POA: Diagnosis not present

## 2023-09-17 DIAGNOSIS — E785 Hyperlipidemia, unspecified: Secondary | ICD-10-CM | POA: Diagnosis not present

## 2023-09-17 DIAGNOSIS — K449 Diaphragmatic hernia without obstruction or gangrene: Secondary | ICD-10-CM | POA: Diagnosis not present

## 2023-09-17 DIAGNOSIS — Z471 Aftercare following joint replacement surgery: Secondary | ICD-10-CM | POA: Diagnosis not present

## 2023-09-17 DIAGNOSIS — Z791 Long term (current) use of non-steroidal anti-inflammatories (NSAID): Secondary | ICD-10-CM | POA: Diagnosis not present

## 2023-09-17 DIAGNOSIS — K5900 Constipation, unspecified: Secondary | ICD-10-CM | POA: Diagnosis not present

## 2023-09-17 DIAGNOSIS — Z7984 Long term (current) use of oral hypoglycemic drugs: Secondary | ICD-10-CM | POA: Diagnosis not present

## 2023-09-19 DIAGNOSIS — Z7982 Long term (current) use of aspirin: Secondary | ICD-10-CM | POA: Diagnosis not present

## 2023-09-19 DIAGNOSIS — G4730 Sleep apnea, unspecified: Secondary | ICD-10-CM | POA: Diagnosis not present

## 2023-09-19 DIAGNOSIS — K5900 Constipation, unspecified: Secondary | ICD-10-CM | POA: Diagnosis not present

## 2023-09-19 DIAGNOSIS — I10 Essential (primary) hypertension: Secondary | ICD-10-CM | POA: Diagnosis not present

## 2023-09-19 DIAGNOSIS — Z7984 Long term (current) use of oral hypoglycemic drugs: Secondary | ICD-10-CM | POA: Diagnosis not present

## 2023-09-19 DIAGNOSIS — Z471 Aftercare following joint replacement surgery: Secondary | ICD-10-CM | POA: Diagnosis not present

## 2023-09-19 DIAGNOSIS — E1143 Type 2 diabetes mellitus with diabetic autonomic (poly)neuropathy: Secondary | ICD-10-CM | POA: Diagnosis not present

## 2023-09-19 DIAGNOSIS — G40909 Epilepsy, unspecified, not intractable, without status epilepticus: Secondary | ICD-10-CM | POA: Diagnosis not present

## 2023-09-19 DIAGNOSIS — Z87891 Personal history of nicotine dependence: Secondary | ICD-10-CM | POA: Diagnosis not present

## 2023-09-19 DIAGNOSIS — K449 Diaphragmatic hernia without obstruction or gangrene: Secondary | ICD-10-CM | POA: Diagnosis not present

## 2023-09-19 DIAGNOSIS — Z96653 Presence of artificial knee joint, bilateral: Secondary | ICD-10-CM | POA: Diagnosis not present

## 2023-09-19 DIAGNOSIS — D649 Anemia, unspecified: Secondary | ICD-10-CM | POA: Diagnosis not present

## 2023-09-19 DIAGNOSIS — Z6841 Body Mass Index (BMI) 40.0 and over, adult: Secondary | ICD-10-CM | POA: Diagnosis not present

## 2023-09-19 DIAGNOSIS — E785 Hyperlipidemia, unspecified: Secondary | ICD-10-CM | POA: Diagnosis not present

## 2023-09-19 DIAGNOSIS — Z791 Long term (current) use of non-steroidal anti-inflammatories (NSAID): Secondary | ICD-10-CM | POA: Diagnosis not present

## 2023-09-20 DIAGNOSIS — M1711 Unilateral primary osteoarthritis, right knee: Secondary | ICD-10-CM | POA: Diagnosis not present

## 2023-09-21 DIAGNOSIS — Z7982 Long term (current) use of aspirin: Secondary | ICD-10-CM | POA: Diagnosis not present

## 2023-09-21 DIAGNOSIS — Z6841 Body Mass Index (BMI) 40.0 and over, adult: Secondary | ICD-10-CM | POA: Diagnosis not present

## 2023-09-21 DIAGNOSIS — E1143 Type 2 diabetes mellitus with diabetic autonomic (poly)neuropathy: Secondary | ICD-10-CM | POA: Diagnosis not present

## 2023-09-21 DIAGNOSIS — Z96653 Presence of artificial knee joint, bilateral: Secondary | ICD-10-CM | POA: Diagnosis not present

## 2023-09-21 DIAGNOSIS — I10 Essential (primary) hypertension: Secondary | ICD-10-CM | POA: Diagnosis not present

## 2023-09-21 DIAGNOSIS — Z7984 Long term (current) use of oral hypoglycemic drugs: Secondary | ICD-10-CM | POA: Diagnosis not present

## 2023-09-21 DIAGNOSIS — K5900 Constipation, unspecified: Secondary | ICD-10-CM | POA: Diagnosis not present

## 2023-09-21 DIAGNOSIS — K449 Diaphragmatic hernia without obstruction or gangrene: Secondary | ICD-10-CM | POA: Diagnosis not present

## 2023-09-21 DIAGNOSIS — Z791 Long term (current) use of non-steroidal anti-inflammatories (NSAID): Secondary | ICD-10-CM | POA: Diagnosis not present

## 2023-09-21 DIAGNOSIS — Z471 Aftercare following joint replacement surgery: Secondary | ICD-10-CM | POA: Diagnosis not present

## 2023-09-21 DIAGNOSIS — Z87891 Personal history of nicotine dependence: Secondary | ICD-10-CM | POA: Diagnosis not present

## 2023-09-21 DIAGNOSIS — G4730 Sleep apnea, unspecified: Secondary | ICD-10-CM | POA: Diagnosis not present

## 2023-09-21 DIAGNOSIS — E785 Hyperlipidemia, unspecified: Secondary | ICD-10-CM | POA: Diagnosis not present

## 2023-09-21 DIAGNOSIS — G40909 Epilepsy, unspecified, not intractable, without status epilepticus: Secondary | ICD-10-CM | POA: Diagnosis not present

## 2023-09-21 DIAGNOSIS — D649 Anemia, unspecified: Secondary | ICD-10-CM | POA: Diagnosis not present

## 2023-10-02 ENCOUNTER — Other Ambulatory Visit: Payer: Self-pay | Admitting: Nurse Practitioner

## 2023-10-02 DIAGNOSIS — E0843 Diabetes mellitus due to underlying condition with diabetic autonomic (poly)neuropathy: Secondary | ICD-10-CM

## 2023-10-03 DIAGNOSIS — M1711 Unilateral primary osteoarthritis, right knee: Secondary | ICD-10-CM | POA: Diagnosis not present

## 2023-10-15 ENCOUNTER — Encounter: Payer: Medicare Other | Admitting: Nurse Practitioner

## 2023-10-17 DIAGNOSIS — M1711 Unilateral primary osteoarthritis, right knee: Secondary | ICD-10-CM | POA: Diagnosis not present

## 2023-10-17 DIAGNOSIS — R262 Difficulty in walking, not elsewhere classified: Secondary | ICD-10-CM | POA: Diagnosis not present

## 2023-10-17 DIAGNOSIS — M25661 Stiffness of right knee, not elsewhere classified: Secondary | ICD-10-CM | POA: Diagnosis not present

## 2023-10-17 DIAGNOSIS — M6281 Muscle weakness (generalized): Secondary | ICD-10-CM | POA: Diagnosis not present

## 2023-10-22 ENCOUNTER — Ambulatory Visit: Payer: Medicare Other | Admitting: Nurse Practitioner

## 2023-10-23 DIAGNOSIS — M1711 Unilateral primary osteoarthritis, right knee: Secondary | ICD-10-CM | POA: Diagnosis not present

## 2023-10-25 DIAGNOSIS — M1711 Unilateral primary osteoarthritis, right knee: Secondary | ICD-10-CM | POA: Diagnosis not present

## 2023-10-25 DIAGNOSIS — R262 Difficulty in walking, not elsewhere classified: Secondary | ICD-10-CM | POA: Diagnosis not present

## 2023-10-25 DIAGNOSIS — M25661 Stiffness of right knee, not elsewhere classified: Secondary | ICD-10-CM | POA: Diagnosis not present

## 2023-10-25 DIAGNOSIS — M6281 Muscle weakness (generalized): Secondary | ICD-10-CM | POA: Diagnosis not present

## 2023-10-31 DIAGNOSIS — M1711 Unilateral primary osteoarthritis, right knee: Secondary | ICD-10-CM | POA: Diagnosis not present

## 2023-10-31 DIAGNOSIS — M25661 Stiffness of right knee, not elsewhere classified: Secondary | ICD-10-CM | POA: Diagnosis not present

## 2023-10-31 DIAGNOSIS — M6281 Muscle weakness (generalized): Secondary | ICD-10-CM | POA: Diagnosis not present

## 2023-10-31 DIAGNOSIS — R262 Difficulty in walking, not elsewhere classified: Secondary | ICD-10-CM | POA: Diagnosis not present

## 2023-11-05 ENCOUNTER — Other Ambulatory Visit: Payer: Self-pay | Admitting: Nurse Practitioner

## 2023-11-05 DIAGNOSIS — E782 Mixed hyperlipidemia: Secondary | ICD-10-CM

## 2023-11-07 DIAGNOSIS — R262 Difficulty in walking, not elsewhere classified: Secondary | ICD-10-CM | POA: Diagnosis not present

## 2023-11-07 DIAGNOSIS — M25661 Stiffness of right knee, not elsewhere classified: Secondary | ICD-10-CM | POA: Diagnosis not present

## 2023-11-07 DIAGNOSIS — M6281 Muscle weakness (generalized): Secondary | ICD-10-CM | POA: Diagnosis not present

## 2023-11-07 DIAGNOSIS — M1711 Unilateral primary osteoarthritis, right knee: Secondary | ICD-10-CM | POA: Diagnosis not present

## 2023-11-14 DIAGNOSIS — R262 Difficulty in walking, not elsewhere classified: Secondary | ICD-10-CM | POA: Diagnosis not present

## 2023-11-14 DIAGNOSIS — M25661 Stiffness of right knee, not elsewhere classified: Secondary | ICD-10-CM | POA: Diagnosis not present

## 2023-11-14 DIAGNOSIS — M1711 Unilateral primary osteoarthritis, right knee: Secondary | ICD-10-CM | POA: Diagnosis not present

## 2023-11-14 DIAGNOSIS — M6281 Muscle weakness (generalized): Secondary | ICD-10-CM | POA: Diagnosis not present

## 2023-11-21 DIAGNOSIS — R262 Difficulty in walking, not elsewhere classified: Secondary | ICD-10-CM | POA: Diagnosis not present

## 2023-11-21 DIAGNOSIS — M6281 Muscle weakness (generalized): Secondary | ICD-10-CM | POA: Diagnosis not present

## 2023-11-21 DIAGNOSIS — M25661 Stiffness of right knee, not elsewhere classified: Secondary | ICD-10-CM | POA: Diagnosis not present

## 2023-11-21 DIAGNOSIS — M1711 Unilateral primary osteoarthritis, right knee: Secondary | ICD-10-CM | POA: Diagnosis not present

## 2023-11-28 DIAGNOSIS — R262 Difficulty in walking, not elsewhere classified: Secondary | ICD-10-CM | POA: Diagnosis not present

## 2023-11-28 DIAGNOSIS — M25661 Stiffness of right knee, not elsewhere classified: Secondary | ICD-10-CM | POA: Diagnosis not present

## 2023-11-28 DIAGNOSIS — M1711 Unilateral primary osteoarthritis, right knee: Secondary | ICD-10-CM | POA: Diagnosis not present

## 2023-11-28 DIAGNOSIS — M6281 Muscle weakness (generalized): Secondary | ICD-10-CM | POA: Diagnosis not present

## 2024-01-07 ENCOUNTER — Other Ambulatory Visit: Payer: Self-pay | Admitting: Nurse Practitioner

## 2024-01-07 DIAGNOSIS — E0843 Diabetes mellitus due to underlying condition with diabetic autonomic (poly)neuropathy: Secondary | ICD-10-CM

## 2024-01-07 DIAGNOSIS — I10 Essential (primary) hypertension: Secondary | ICD-10-CM

## 2024-01-24 ENCOUNTER — Other Ambulatory Visit

## 2024-01-24 DIAGNOSIS — I10 Essential (primary) hypertension: Secondary | ICD-10-CM | POA: Diagnosis not present

## 2024-01-24 DIAGNOSIS — D649 Anemia, unspecified: Secondary | ICD-10-CM

## 2024-01-24 DIAGNOSIS — E0843 Diabetes mellitus due to underlying condition with diabetic autonomic (poly)neuropathy: Secondary | ICD-10-CM | POA: Diagnosis not present

## 2024-01-24 LAB — CBC WITH DIFFERENTIAL/PLATELET
Absolute Lymphocytes: 3777 {cells}/uL (ref 850–3900)
Absolute Monocytes: 490 {cells}/uL (ref 200–950)
Basophils Absolute: 42 {cells}/uL (ref 0–200)
Basophils Relative: 0.5 %
Eosinophils Absolute: 257 {cells}/uL (ref 15–500)
Eosinophils Relative: 3.1 %
HCT: 39.9 % (ref 38.5–50.0)
Hemoglobin: 12.1 g/dL — ABNORMAL LOW (ref 13.2–17.1)
MCH: 23 pg — ABNORMAL LOW (ref 27.0–33.0)
MCHC: 30.3 g/dL — ABNORMAL LOW (ref 32.0–36.0)
MCV: 75.7 fL — ABNORMAL LOW (ref 80.0–100.0)
MPV: 12.2 fL (ref 7.5–12.5)
Monocytes Relative: 5.9 %
Neutro Abs: 3735 {cells}/uL (ref 1500–7800)
Neutrophils Relative %: 45 %
Platelets: 260 Thousand/uL (ref 140–400)
RBC: 5.27 Million/uL (ref 4.20–5.80)
RDW: 14.7 % (ref 11.0–15.0)
Total Lymphocyte: 45.5 %
WBC: 8.3 Thousand/uL (ref 3.8–10.8)

## 2024-01-24 LAB — HEMOGLOBIN A1C
Hgb A1c MFr Bld: 6.5 % — ABNORMAL HIGH (ref <5.7)
Mean Plasma Glucose: 140 mg/dL
eAG (mmol/L): 7.7 mmol/L

## 2024-01-24 LAB — COMPLETE METABOLIC PANEL WITHOUT GFR
AG Ratio: 1.2 (calc) (ref 1.0–2.5)
ALT: 9 U/L (ref 9–46)
AST: 14 U/L (ref 10–35)
Albumin: 4.2 g/dL (ref 3.6–5.1)
Alkaline phosphatase (APISO): 113 U/L (ref 35–144)
BUN: 14 mg/dL (ref 7–25)
CO2: 29 mmol/L (ref 20–32)
Calcium: 9.7 mg/dL (ref 8.6–10.3)
Chloride: 102 mmol/L (ref 98–110)
Creat: 0.86 mg/dL (ref 0.70–1.35)
Globulin: 3.4 g/dL (ref 1.9–3.7)
Glucose, Bld: 104 mg/dL — ABNORMAL HIGH (ref 65–99)
Potassium: 4.2 mmol/L (ref 3.5–5.3)
Sodium: 139 mmol/L (ref 135–146)
Total Bilirubin: 0.4 mg/dL (ref 0.2–1.2)
Total Protein: 7.6 g/dL (ref 6.1–8.1)

## 2024-01-25 ENCOUNTER — Ambulatory Visit: Payer: Self-pay | Admitting: Nurse Practitioner

## 2024-01-28 ENCOUNTER — Ambulatory Visit: Admitting: Nurse Practitioner

## 2024-01-28 VITALS — BP 122/78 | HR 94 | Temp 97.8°F | Ht 75.0 in | Wt 348.8 lb

## 2024-01-28 DIAGNOSIS — E782 Mixed hyperlipidemia: Secondary | ICD-10-CM

## 2024-01-28 DIAGNOSIS — I10 Essential (primary) hypertension: Secondary | ICD-10-CM | POA: Diagnosis not present

## 2024-01-28 DIAGNOSIS — D649 Anemia, unspecified: Secondary | ICD-10-CM

## 2024-01-28 DIAGNOSIS — E0843 Diabetes mellitus due to underlying condition with diabetic autonomic (poly)neuropathy: Secondary | ICD-10-CM

## 2024-01-28 DIAGNOSIS — M170 Bilateral primary osteoarthritis of knee: Secondary | ICD-10-CM | POA: Diagnosis not present

## 2024-01-28 NOTE — Assessment & Plan Note (Signed)
 Long hx of anemia Well controlled on recent labs Continue to follow blood counts

## 2024-01-28 NOTE — Assessment & Plan Note (Signed)
 Blood pressure well controlled, goal bp <140/90 Continue current medications and dietary modifications follow metabolic panel

## 2024-01-28 NOTE — Progress Notes (Signed)
 Careteam: Patient Care Team: Caro Harlene POUR, NP as PCP - General (Geriatric Medicine) Rollin Dover, MD as Consulting Physician (Gastroenterology) Onesimo Emaline Brink, MD as Consulting Physician (Hematology) Jane Charleston, MD as Consulting Physician (Orthopedic Surgery)  PLACE OF SERVICE:  [facility]  Advanced Directive information    No Known Allergies  Chief Complaint  Patient presents with   Medical Management of Chronic Issues    6 month routine follow up  Patient stated  he was undecided if he was getting covid / flu vaccine  Patient hasn't had an eye exam     HPI:  Discussed the use of AI scribe software for clinical note transcription with the patient, who gave verbal consent to proceed.  History of Present Illness Michael [name] is a 68 year old male who presents for a six-month follow-up after right knee replacements. He has now had bilateral knee replacements due to OA  He experiences persistent swelling and pain in right knee,particularly when sleeping or lying on his left side. He wants to return to the gym to manage his weight, which has increased since the surgery. He last consulted his orthopedic surgeon in May and is scheduled for a follow-up in July.  He has type 2 diabetes, managed with metformin  1000 mg once daily and Jardiance  10 mg daily. Recent blood work shows a fasting blood sugar of 104 mg/dL and an YaJ8r of 3.4%, slightly elevated from six months ago. He occasionally forgets the second dose of metformin  at night but takes daily   He has a history of anemia, with a recent hemoglobin level of 12.1 g/dL, an improvement from previous levels. He has chronic anemia.   He is on Norvasc  10 mg daily, hydrochlorothiazide  25 mg daily, and lisinopril  10 mg daily for hypertension, which is currently well-controlled.   His cholesterol was last checked in April and was within normal limits.  He reports occasional chest pains, described as  similar to those he has experienced for the past fifty years, with a recent episode occurring last week but occurring much less than previously. He also experienced palpitations one night without associated pain.  He experiences phlegm and frequent throat clearing, which started around September or October and is not constant. No post nasal drip, GERD, cough, congestion, shortness of breath.  No new chest pains, palpitations, or changes in breathing such as shortness of breath, cough, congestion, or wheezing. No indigestion.   Review of Systems:  Review of Systems  Constitutional:  Negative for chills, fever and weight loss.  HENT:  Negative for tinnitus.   Respiratory:  Negative for cough, sputum production and shortness of breath.   Cardiovascular:  Negative for chest pain, palpitations and leg swelling.  Gastrointestinal:  Negative for abdominal pain, constipation, diarrhea and heartburn.  Genitourinary:  Negative for dysuria, frequency and urgency.  Musculoskeletal:  Negative for back pain, falls, joint pain and myalgias.  Skin: Negative.   Neurological:  Negative for dizziness and headaches.  Psychiatric/Behavioral:  Negative for depression and memory loss. The patient does not have insomnia.     Past Medical History:  Diagnosis Date   Anemia    Arthritis    Chest pain    Per incoming records from [facility]    Diabetes mellitus without complication (HCC)    Epilepsy (HCC) 1971   seizure  last one 1971, etiology unknown per patient.   Hypertension    Obesity, morbid, BMI 50 or higher (HCC)  Per incoming records from [facility]    Sleep apnea    no CPAP   Type 2 diabetes mellitus [facility])    Past Surgical History:  Procedure Laterality Date   COLONOSCOPY WITH PROPOFOL  N/A 11/01/2018   Procedure: COLONOSCOPY WITH PROPOFOL ;  Surgeon: Rollin Dover, MD;  Location: [facility] ENDOSCOPY;  Service: Endoscopy;  Laterality: N/A;   KNEE ARTHROSCOPY Right 2005   Dr Gwinda    POLYPECTOMY  11/01/2018   Procedure: POLYPECTOMY;  Surgeon: Rollin Dover, MD;  Location: [facility] ENDOSCOPY;  Service: Endoscopy;;   TOTAL KNEE ARTHROPLASTY Left 03/07/2023   Procedure: TOTAL KNEE ARTHROPLASTY;  Surgeon: Edna Toribio LABOR, MD;  Location: [facility] ORS;  Service: Orthopedics;  Laterality: Left;   TOTAL KNEE ARTHROPLASTY Right 09/05/2023   Procedure: ARTHROPLASTY, KNEE, TOTAL;  Surgeon: Edna Toribio LABOR, MD;  Location: [facility] ORS;  Service: Orthopedics;  Laterality: Right;   WISDOM TOOTH EXTRACTION     Social History:   reports that he quit smoking about 19 years ago. His smoking use included cigarettes. He has never been exposed to tobacco smoke. He has never used smokeless tobacco. He reports that he does not currently use alcohol . He reports that he does not use drugs.  History reviewed. No pertinent family history.  Medications: Patient's Medications  New Prescriptions   No medications on file  Previous Medications   AMLODIPINE  (NORVASC ) 10 MG TABLET    Take 1 tablet by mouth once daily   ATORVASTATIN  (LIPITOR) 20 MG TABLET    Take 1 tablet by mouth once daily   EMPAGLIFLOZIN  (JARDIANCE ) 10 MG TABS TABLET    TAKE 1 TABLET BY MOUTH ONCE DAILY BEFORE BREAKFAST   HYDROCHLOROTHIAZIDE  (HYDRODIURIL ) 25 MG TABLET    Take 1 tablet by mouth once daily   LISINOPRIL  (ZESTRIL ) 10 MG TABLET    Take 1 tablet by mouth once daily   METFORMIN  (GLUCOPHAGE ) 1000 MG TABLET    TAKE 1 TABLET BY MOUTH TWICE DAILY WITH A MEAL  Modified Medications   No medications on file  Discontinued Medications   OMEPRAZOLE  (PRILOSEC) 40 MG CAPSULE    Take 1 capsule (40 mg total) by mouth daily for 21 days.   POLYETHYLENE GLYCOL (MIRALAX ) 17 G PACKET    Take 17 g by mouth daily.    Physical Exam:  Vitals:   01/28/24 1313  BP: 122/78  Pulse: 94  Temp: 97.8 F (36.6 C)  SpO2: 97%  Weight: (!) 348 lb 12.8 oz (158.2 kg)  Height: 6' 3 (1.905 m)   Body mass index is 43.6 kg/m. Wt Readings from Last 3  Encounters:  01/28/24 (!) 348 lb 12.8 oz (158.2 kg)  09/05/23 (!) 310 lb 13.6 oz (141 kg)  08/29/23 (!) 312 lb (141.5 kg)    Physical Exam Constitutional:      General: He is not in acute distress.    Appearance: He is well-developed. He is not diaphoretic.  HENT:     Head: Normocephalic and atraumatic.     Right Ear: External ear normal.     Left Ear: External ear normal.     Mouth/Throat:     Pharynx: No oropharyngeal exudate.  Eyes:     Conjunctiva/sclera: Conjunctivae normal.     Pupils: Pupils are equal, round, and reactive to light.  Cardiovascular:     Rate and Rhythm: Normal rate and regular rhythm.     Heart sounds: Normal heart sounds.  Pulmonary:     Effort: Pulmonary effort is normal.  Breath sounds: Normal breath sounds.  Abdominal:     General: Bowel sounds are normal.     Palpations: Abdomen is soft.  Musculoskeletal:        General: No tenderness.     Cervical back: Normal range of motion and neck supple.     Right lower leg: No edema.     Left lower leg: No edema.  Skin:    General: Skin is warm and dry.  Neurological:     Mental Status: He is alert and oriented to person, place, and time.     Motor: No weakness.     Gait: Gait normal.     Labs reviewed: Basic Metabolic Panel: Recent Labs    08/29/23 1028 09/06/23 0351 01/24/24 0828  NA 139 134* 139  K 3.7 3.9 4.2  CL 103 102 102  CO2 26 26 29   GLUCOSE 144* 123* 104*  BUN 16 18 14   CREATININE 1.02 0.85 0.86  CALCIUM  9.3 8.7* 9.7   Liver Function Tests: Recent Labs    02/22/23 1058 07/23/23 1331 08/29/23 1028 01/24/24 0828  AST 17 13 17 14   ALT 9 4* 10 9  ALKPHOS 103  --  94  --   BILITOT 0.4 0.4 0.6 0.4  PROT 7.8 7.6 7.3 7.6  ALBUMIN 3.9  --  3.7  --    No results for input(s): LIPASE, AMYLASE in the last 8760 hours. No results for input(s): AMMONIA in the last 8760 hours. CBC: Recent Labs    07/23/23 1331 08/29/23 1028 09/06/23 0351 01/24/24 0828  WBC 8.2 7.7  17.8* 8.3  NEUTROABS 3,190 3.5  --  3,735  HGB 11.8* 11.8* 10.1* 12.1*  HCT 39.1 40.5 33.2* 39.9  MCV 75.6* 79.4* 77.4* 75.7*  PLT 248 223 212 260   Lipid Panel: Recent Labs    07/23/23 1331  CHOL 133  HDL 49  LDLCALC 68  TRIG 78  CHOLHDL 2.7   TSH: No results for input(s): TSH in the last 8760 hours. A1C: Lab Results  Component Value Date   HGBA1C 6.5 (H) 01/24/2024     Assessment/Plan Diabetes mellitus due to underlying condition with diabetic autonomic neuropathy, without long-term current use of insulin  (HCC) Assessment & Plan: Encouraged dietary compliance, routine foot care/monitoring and to keep up with diabetic eye exams through ophthalmology  A1c has been in ranged Continue current medications   Essential hypertension Assessment & Plan: Blood pressure well controlled, goal bp <140/90 Continue current medications and dietary modifications follow metabolic panel   Mixed hyperlipidemia Assessment & Plan: Continues on lipitor LDL was at goal in April 2025  Continue current regimen with lifestyle modifications   Anemia, unspecified type Assessment & Plan: Long hx of anemia Well controlled on recent labs Continue to follow blood counts   Morbid obesity (HCC) Assessment & Plan: -education provided on healthy weight loss through increase in physical activity and proper nutrition.  Plans to increase physical activity.    Bilateral primary osteoarthritis of knee Assessment & Plan: S/p right knee replacement.  Now both knees have been replaced      Return in about 6 months (around 07/28/2024) for routine follow up, labs prior to visit.  Jeannemarie Sawaya K. Caro BODILY [facility] [phone]

## 2024-01-28 NOTE — Assessment & Plan Note (Signed)
 S/p right knee replacement.  Now both knees have been replaced

## 2024-01-28 NOTE — Assessment & Plan Note (Signed)
-  education provided on healthy weight loss through increase in physical activity and proper nutrition.  Plans to increase physical activity.

## 2024-01-28 NOTE — Assessment & Plan Note (Signed)
 Encouraged dietary compliance, routine foot care/monitoring and to keep up with diabetic eye exams through ophthalmology  A1c has been in ranged Continue current medications

## 2024-01-28 NOTE — Assessment & Plan Note (Signed)
 Continues on lipitor LDL was at goal in April 2025  Continue current regimen with lifestyle modifications

## 2024-01-28 NOTE — Patient Instructions (Signed)
 [facility] eye [facility] Address: [address] [place], [place], [place] [address] [phone]

## 2024-02-07 NOTE — Progress Notes (Signed)
 Michael [name] Joshua Mickey.                                          MRN: [id number]   02/07/2024   The [facility] Quality Team Specialist reviewed this patient medical record for the purposes of chart review for care gap closure. The following were reviewed: abstraction for care gap closure-diabetic eye exam.    [facility] Quality Team

## 2024-03-03 ENCOUNTER — Other Ambulatory Visit: Payer: Self-pay | Admitting: Nurse Practitioner

## 2024-03-03 DIAGNOSIS — E782 Mixed hyperlipidemia: Secondary | ICD-10-CM

## 2024-03-05 ENCOUNTER — Ambulatory Visit: Payer: Self-pay | Admitting: Nurse Practitioner

## 2024-03-05 ENCOUNTER — Encounter: Payer: Self-pay | Admitting: Nurse Practitioner

## 2024-03-05 VITALS — BP 118/70 | HR 89 | Temp 98.1°F | Ht 75.0 in | Wt 351.6 lb

## 2024-03-05 DIAGNOSIS — E782 Mixed hyperlipidemia: Secondary | ICD-10-CM | POA: Diagnosis not present

## 2024-03-05 DIAGNOSIS — Z0000 Encounter for general adult medical examination without abnormal findings: Secondary | ICD-10-CM | POA: Diagnosis not present

## 2024-03-05 DIAGNOSIS — Z23 Encounter for immunization: Secondary | ICD-10-CM

## 2024-03-05 MED ORDER — HYDROCHLOROTHIAZIDE 25 MG PO TABS: 25.0000 mg | ORAL_TABLET | Freq: Every day | ORAL | 1 refills | Status: AC

## 2024-03-05 MED ORDER — ATORVASTATIN CALCIUM 20 MG PO TABS: 20.0000 mg | ORAL_TABLET | Freq: Every day | ORAL | 1 refills | Status: AC

## 2024-03-05 NOTE — Progress Notes (Signed)
 Chief Complaint  Patient presents with   Medicare Wellness    AWV     Subjective:   Michael [name]. is a 68 y.o. male who presents for a Medicare Annual Wellness Visit.  Visit info / Clinical Intake: Medicare Wellness Visit Type:: Welcome to [employer] (IPPE) Persons participating in visit and providing information:: patient Medicare Wellness Visit Mode:: In-person (required for WTM) Interpreter Needed?: No Pre-visit prep was completed: no AWV questionnaire completed by patient prior to visit?: no Living arrangements:: (!) lives alone Patient's Overall Health Status Rating: good Typical amount of pain: some Does pain affect daily life?: no Are you currently prescribed opioids?: no  Dietary Habits and Nutritional Risks How many meals a day?: 3 Eats fruit and vegetables daily?: yes Most meals are obtained by: preparing own meals In the last 2 weeks, have you had any of the following?: none Diabetic:: (!) yes Any non-healing wounds?: no How often do you check your BS?: as needed Would you like to be referred to a Nutritionist or for Diabetic Management? : no  Functional Status Activities of Daily Living (to include ambulation/medication): Independent Ambulation: Independent Medication Administration: Independent Home Management (perform basic housework or laundry): Independent Manage your own finances?: yes Primary transportation is: facility / other Concerns about vision?: no *vision screening is required for WTM* Concerns about hearing?: no  Fall Screening Falls in the past year?: 0 Number of falls in past year: 0 Was there an injury with Fall?: 0 Fall Risk Category Calculator: 0 Patient Fall Risk Level: Low Fall Risk  Fall Risk Patient at Risk for Falls Due to: No Fall Risks Fall risk Follow up: Falls evaluation completed  Home and Transportation Safety: All rugs have non-skid backing?: yes All stairs or steps have railings?: yes Grab bars in the bathtub  or shower?: yes Have non-skid surface in bathtub or shower?: yes Good home lighting?: yes Regular seat belt use?: yes Hospital stays in the last year:: (!) yes How many hospital stays:: 2  Cognitive Assessment Difficulty concentrating, remembering, or making decisions? : no Will 6CIT or Mini Cog be Completed: yes What year is it?: 0 points What month is it?: 0 points Give patient an address phrase to remember (5 components): [address] [facility] [place] About what time is it?: 0 points Count backwards from 20 to 1: 0 points Say the months of the year in reverse: 0 points Repeat the address phrase from earlier: 0 points 6 CIT Score: 0 points  Advance Directives (For Healthcare) Does Patient Have a Medical Advance Directive?: Yes Does patient want to make changes to medical advance directive?: No - Patient declined Type of Advance Directive: Healthcare Power of [place]; Living will Copy of Healthcare Power of Attorney in Chart?: No - copy requested Copy of Living Will in Chart?: No - copy requested Would patient like information on creating a medical advance directive?: No - Patient declined  Reviewed/Updated  Reviewed/Updated: Reviewed All (Medical, Surgical, Family, Medications, Allergies, Care Teams, Patient Goals)    Allergies (verified) Patient has no known allergies.   Current Medications (verified) Outpatient Encounter Medications as of 03/05/2024  Medication Sig   amLODipine  (NORVASC ) 10 MG tablet Take 1 tablet by mouth once daily   empagliflozin  (JARDIANCE ) 10 MG TABS tablet TAKE 1 TABLET BY MOUTH ONCE DAILY BEFORE BREAKFAST   lisinopril  (ZESTRIL ) 10 MG tablet Take 1 tablet by mouth once daily   metFORMIN  (GLUCOPHAGE ) 1000 MG tablet TAKE 1 TABLET BY MOUTH TWICE DAILY WITH A  MEAL   [DISCONTINUED] atorvastatin  (LIPITOR) 20 MG tablet Take 1 tablet by mouth once daily   atorvastatin  (LIPITOR) 20 MG tablet Take 1 tablet (20 mg total) by mouth daily.   hydrochlorothiazide   (HYDRODIURIL ) 25 MG tablet Take 1 tablet (25 mg total) by mouth daily.   [DISCONTINUED] hydrochlorothiazide  (HYDRODIURIL ) 25 MG tablet Take 1 tablet by mouth once daily   No facility-administered encounter medications on file as of 03/05/2024.    History: Past Medical History:  Diagnosis Date   Anemia    Arthritis    Chest pain    Per incoming records from [facility]    Diabetes mellitus without complication (HCC)    Epilepsy (HCC) 1971   seizure  last one 1971, etiology unknown per patient.   Hypertension    Obesity, morbid, BMI 50 or higher (HCC)    Per incoming records [facility]    Sleep apnea    no CPAP   Type 2 diabetes mellitus (HCC)    Past Surgical History:  Procedure Laterality Date   COLONOSCOPY WITH PROPOFOL  N/A 11/01/2018   Procedure: COLONOSCOPY WITH PROPOFOL ;  Surgeon: Rollin Dover, MD;  Location: [facility];  Service: Endoscopy;  Laterality: N/A;   KNEE ARTHROSCOPY Right 2005   Dr Gwinda   POLYPECTOMY  11/01/2018   Procedure: POLYPECTOMY;  Surgeon: Rollin Dover, MD;  Location: [facility];  Service: Endoscopy;;   TOTAL KNEE ARTHROPLASTY Left 03/07/2023   Procedure: TOTAL KNEE ARTHROPLASTY;  Surgeon: Edna Toribio LABOR, MD;  Location: [facility];  Service: Orthopedics;  Laterality: Left;   TOTAL KNEE ARTHROPLASTY Right 09/05/2023   Procedure: ARTHROPLASTY, KNEE, TOTAL;  Surgeon: Edna Toribio LABOR, MD;  Location: [facility];  Service: Orthopedics;  Laterality: Right;   WISDOM TOOTH EXTRACTION     No family history on file. Social History   Occupational History   Not on file  Tobacco Use   Smoking status: Former    Current packs/day: 0.00    Types: Cigarettes    Quit date: 12/02/2004    Years since quitting: 19.2    Passive exposure: Never   Smokeless tobacco: Never   Tobacco comments:    8-10 a day off/on for 30 years  Vaping Use   Vaping status: Never Used  Substance and Sexual Activity   Alcohol  use: Not Currently    Comment: quit 2015    Drug use: Never   Sexual activity: Not Currently   Tobacco Counseling Counseling given: Not Answered Tobacco comments: 8-10 a day off/on for 30 years  SDOH Screenings   Food Insecurity: No Food Insecurity (01/28/2024)  Housing: Low Risk  (01/28/2024)  Transportation Needs: No Transportation Needs (01/28/2024)  Utilities: Not At Risk (09/06/2023)  Depression (PHQ2-9): Low Risk  (03/05/2024)  Financial Resource Strain: Low Risk  (01/28/2024)  Physical Activity: Insufficiently Active (03/05/2024)  Social Connections: Moderately Isolated (03/05/2024)  Stress: No Stress Concern Present (03/05/2024)  Tobacco Use: Medium Risk (03/05/2024)   See flowsheets for full screening details  Depression Screen PHQ 2 & 9 Depression Scale- Over the past 2 weeks, how often have you been bothered by any of the following problems? Little interest or pleasure in doing things: 0 Feeling down, depressed, or hopeless (PHQ Adolescent also includes...irritable): 0 PHQ-2 Total Score: 0     Goals Addressed   None          Objective:    Today's Vitals   03/05/24 1319  BP: 118/70  Pulse: 89  Temp: 98.1 F (36.7 C)  SpO2: 98%  Weight: (!) 351 lb 9.6 oz (159.5 kg)  Height: 6' 3 (1.905 m)   Body mass index is 43.95 kg/m.  Hearing/Vision screen Hearing Screening - Comments:: No hearing issues Immunizations and Health Maintenance Health Maintenance  Topic Date Due   OPHTHALMOLOGY EXAM  04/28/2023   Influenza Vaccine  11/02/2023   COVID-19 Vaccine (4 - 2025-26 season) 12/03/2023   FOOT EXAM  04/12/2024   Diabetic kidney evaluation - Urine ACR  07/22/2024   HEMOGLOBIN A1C  07/24/2024   Diabetic kidney evaluation - eGFR measurement  01/23/2025   Medicare Annual Wellness (AWV)  03/05/2025   DTaP/Tdap/Td (2 - Td or Tdap) 11/11/2025   Colonoscopy  10/31/2028   Pneumococcal Vaccine: 50+ Years  Completed   Hepatitis C Screening  Completed   Meningococcal B Vaccine  Aged Out   Zoster Vaccines-  Shingrix  Discontinued        Assessment/Plan:  This is a routine wellness examination for [place].  Patient Care Team: Caro Harlene POUR, NP as PCP - General (Geriatric Medicine) Rollin Dover, MD as Consulting Physician (Gastroenterology) Onesimo Emaline Brink, MD as Consulting Physician (Hematology) Jane Charleston, MD as Consulting Physician (Orthopedic Surgery)  I have personally reviewed and noted the following in the patient's chart:   Medical and social history Use of alcohol , tobacco or illicit drugs  Current medications and supplements including opioid prescriptions. Functional ability and status Nutritional status Physical activity Advanced directives List of other physicians Hospitalizations, surgeries, and ER visits in previous 12 months Vitals Screenings to include cognitive, depression, and falls Referrals and appointments  No orders of the defined types were placed in this encounter.  In addition, I have reviewed and discussed with patient certain preventive protocols, quality metrics, and best practice recommendations. A written personalized care plan for preventive services as well as general preventive health recommendations were provided to patient.   Harlene POUR Caro, NP   03/05/2024   Return in 1 year (on 03/05/2025) for AWV.  After Visit Summary: (In Person-Printed) AVS printed and given to the patient

## 2024-03-05 NOTE — Patient Instructions (Signed)
 Michael [name],  Thank you for taking the time for your Medicare Wellness Visit. I appreciate your continued commitment to your health goals. Please review the care plan we discussed, and feel free to reach out if I can assist you further.  Please note that Annual Wellness Visits do not include a physical exam. Some assessments may be limited, especially if the visit was conducted virtually. If needed, we may recommend an in-person follow-up with your provider.  Ongoing Care Seeing your primary care provider every 3 to 6 months helps us  monitor your health and provide consistent, personalized care.   Referrals If a referral was made during today's visit and you haven't received any updates within two weeks, please contact the referred provider directly to check on the status.  Recommended Screenings:  Health Maintenance  Topic Date Due   Eye exam for diabetics  04/28/2023   Flu Shot  11/02/2023   COVID-19 Vaccine (4 - 2025-26 season) 12/03/2023   Complete foot exam   04/12/2024   Yearly kidney health urinalysis for diabetes  07/22/2024   Hemoglobin A1C  07/24/2024   Yearly kidney function blood test for diabetes  01/23/2025   Medicare Annual Wellness Visit  03/05/2025   DTaP/Tdap/Td vaccine (2 - Td or Tdap) 11/11/2025   Colon Cancer Screening  10/31/2028   Pneumococcal Vaccine for age over 34  Completed   Hepatitis C Screening  Completed   Meningitis B Vaccine  Aged Out   Zoster (Shingles) Vaccine  Discontinued       03/05/2024    1:31 PM  Advanced Directives  Does Patient Have a Medical Advance Directive? Yes  Type of Estate Agent of [place];Living will  Copy of Healthcare Power of Attorney in Chart? No - copy requested    Vision: Annual vision screenings are recommended for early detection of glaucoma, cataracts, and diabetic retinopathy. These exams can also reveal signs of chronic conditions such as diabetes and high blood pressure.  Dental: Annual  dental screenings help detect early signs of oral cancer, gum disease, and other conditions linked to overall health, including heart disease and diabetes.  Please see the attached documents for additional preventive care recommendations.   COVID vaccine at pharmacy  To make appt for eye doctor.

## 2024-05-01 ENCOUNTER — Other Ambulatory Visit: Payer: Self-pay | Admitting: Nurse Practitioner

## 2024-05-01 DIAGNOSIS — I10 Essential (primary) hypertension: Secondary | ICD-10-CM

## 2024-05-01 DIAGNOSIS — E0843 Diabetes mellitus due to underlying condition with diabetic autonomic (poly)neuropathy: Secondary | ICD-10-CM

## 2024-05-09 ENCOUNTER — Other Ambulatory Visit: Payer: Self-pay | Admitting: Nurse Practitioner

## 2024-05-09 DIAGNOSIS — E0843 Diabetes mellitus due to underlying condition with diabetic autonomic (poly)neuropathy: Secondary | ICD-10-CM

## 2024-05-09 DIAGNOSIS — I10 Essential (primary) hypertension: Secondary | ICD-10-CM

## 2024-07-22 ENCOUNTER — Other Ambulatory Visit: Payer: Self-pay

## 2024-07-28 ENCOUNTER — Ambulatory Visit: Payer: Self-pay | Admitting: Nurse Practitioner

## 2025-03-06 ENCOUNTER — Ambulatory Visit: Admitting: Nurse Practitioner
